# Patient Record
Sex: Female | Born: 1990 | Race: Black or African American | Hispanic: No | Marital: Single | Smoking: Never smoker
Health system: Southern US, Community
[De-identification: ages and names within clinical notes are randomized; demographics above are authoritative.]

## PROBLEM LIST (undated history)

## (undated) ENCOUNTER — Emergency Department (HOSPITAL_COMMUNITY): Admission: EM | Payer: BC Managed Care – PPO | Source: Home / Self Care

## (undated) DIAGNOSIS — R52 Pain, unspecified: Secondary | ICD-10-CM

## (undated) DIAGNOSIS — E669 Obesity, unspecified: Secondary | ICD-10-CM

## (undated) DIAGNOSIS — M549 Dorsalgia, unspecified: Secondary | ICD-10-CM

## (undated) DIAGNOSIS — D649 Anemia, unspecified: Secondary | ICD-10-CM

## (undated) DIAGNOSIS — E079 Disorder of thyroid, unspecified: Secondary | ICD-10-CM

## (undated) DIAGNOSIS — E119 Type 2 diabetes mellitus without complications: Secondary | ICD-10-CM

## (undated) DIAGNOSIS — G4730 Sleep apnea, unspecified: Secondary | ICD-10-CM

## (undated) DIAGNOSIS — I10 Essential (primary) hypertension: Secondary | ICD-10-CM

## (undated) DIAGNOSIS — E039 Hypothyroidism, unspecified: Secondary | ICD-10-CM

## (undated) HISTORY — DX: Dorsalgia, unspecified: M54.9

## (undated) HISTORY — DX: Disorder of thyroid, unspecified: E07.9

## (undated) HISTORY — DX: Type 2 diabetes mellitus without complications: E11.9

## (undated) HISTORY — DX: Essential (primary) hypertension: I10

## (undated) HISTORY — PX: TONSILLECTOMY: SUR1361

## (undated) HISTORY — PX: WISDOM TOOTH EXTRACTION: SHX21

---
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
## 2014-08-08 ENCOUNTER — Other Ambulatory Visit (HOSPITAL_COMMUNITY): Payer: Self-pay | Admitting: Specialist

## 2014-08-08 DIAGNOSIS — M5126 Other intervertebral disc displacement, lumbar region: Secondary | ICD-10-CM

## 2014-08-14 ENCOUNTER — Ambulatory Visit (HOSPITAL_COMMUNITY)
Admission: RE | Admit: 2014-08-14 | Discharge: 2014-08-14 | Disposition: A | Discharge status: Home / Self Care | Payer: PRIVATE HEALTH INSURANCE | Source: Ambulatory Visit | Attending: Specialist | Admitting: Specialist

## 2014-08-14 DIAGNOSIS — M5126 Other intervertebral disc displacement, lumbar region: Secondary | ICD-10-CM

## 2014-08-22 ENCOUNTER — Ambulatory Visit: Payer: Self-pay | Admitting: Orthopedic Surgery

## 2014-08-22 NOTE — Progress Notes (Signed)
Please put orders in Epic surgery 09-03-14 pre op 08-27-14 Thanks 

## 2014-08-25 ENCOUNTER — Ambulatory Visit: Payer: Self-pay | Admitting: Orthopedic Surgery

## 2014-08-25 NOTE — H&P (Signed)
Brianna [name] is an 24 y.o. female.   Chief Complaint: back and R leg pain HPI: The patient is a 24 year old female who presents today for follow up of their back. The patient is being followed for their low back pain. They are now 9 1/2 months out from when symptoms began. Symptoms reported today include: numbness (right foot), leg pain (RLE) and pain with sitting. The patient states that they are doing poorly. Current treatment includes: relative rest, activity modification and NSAIDs. The following medication has been used for pain control: antiinflammatory medication (Aleve). The patient presents today following MRI.  No past medical history on file.  No past surgical history on file.  No family history on file. Social History:  has no tobacco, alcohol, and drug history on file.  Allergies: No Known Allergies   (Not in a hospital admission)  No results found for this or any previous visit (from the past 48 hour(s)). No results found.  Review of Systems  Constitutional: Negative.   HENT: Negative.   Eyes: Negative.   Respiratory: Negative.   Cardiovascular: Negative.   Gastrointestinal: Negative.   Genitourinary: Negative.   Musculoskeletal: Positive for back pain.  Skin: Negative.   Neurological: Positive for sensory change and focal weakness.  Psychiatric/Behavioral: Negative.     There were no vitals taken for this visit. Physical Exam  Constitutional: She is oriented to person, place, and time. She appears distressed.  obese  HENT:  Head: Normocephalic and atraumatic.  Eyes: Conjunctivae and EOM are normal. Pupils are equal, round, and reactive to light.  Neck: Normal range of motion. Neck supple.  Cardiovascular: Normal rate and regular rhythm.   Respiratory: Effort normal and breath sounds normal.  GI: Soft. Bowel sounds are normal.  Musculoskeletal:  On exam, she's standing upright in mild distress. Mood and affect are appropriate. She walks with an antalgic  gait. In seated position, SLR on the right produces marked buttock, thigh and calf pain exacerbated with dorsal augmentation maneuver. Left buttock pain. Trace EHL weakness and plantar flexion weakness on the right compared to the left. She has decreased sensation in the S1 dermatome on the right.  Lumbar spine exam reveals no evidence of soft tissue swelling, ecchymosis or deformity. The abdomen is soft and nontender. Nontender over the trochanters. No cellulitis or lymphadenopathy.  Good range of motion of the lumbar spine without associated pain. Motor is 5/5 including tibialis anterior, plantar flexion, quadriceps and hamstrings. Patient is normoreflexic. There is no Babinski or clonus. The patient has good distal pulses. No DVT. No pain and normal range of motion without instability of the hips, knees and ankles.  Neurological: She is alert and oriented to person, place, and time. She has normal reflexes.  Skin: Skin is warm and dry.  Psychiatric: She has a normal mood and affect.    X-rays demonstrate mild disc space narrowing at 4-5 and 5-1. No instability in flexion or extension. Hips are unremarkable. MRI demonstrates a large disc herniation to the right free fragment compressing the S1 nerve root. She has moderate to severe stenosis secondary to this. She has a central disc herniation to the right effacing the 5 root.  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 L5-S1 radiculopathy, myotomal weakness, dermatomal dysesthesias secondary to large disc herniation with extruded fragment at 5-1, large disc herniation at 4-5. 2. Elevated BMI at age of 19.  We had an extensive discussion concerning current pathology, relevant anatomy and treatment options. Her symptoms have been ongoing.  My main concern is the disc herniation and the weakness associated with this. I do not feel injections would be efficacious. She has tried pain medicine, anti-inflammatories without avail. I gave her  a prescription for Neurontin to be taken as directed. They would like to try that first to see what happens. I gave her a work note. She should not be lifting over 5 lbs., bending or prolonged standing or sitting. I'd like to see her back in a week. She's had no change in bowel or bladder function. If that occurs, we discussed urgent decompression. We discussed with her lumbar decompression in extensive detail. She will call within a week. If she has ongoing symptoms, I'd recommend she would be wise to consider decompression at 5-1 and at 4-5.  Brianna [name]'s mother, Brianna DandyMary, called back this morning and states she had been crying all last night with severe pain into her leg. She was taking the Neurontin to no avail. They want to proceed with the surgery as discussed as previously with the decompression at L5-S1 and at L4-5. We discussed the risks and benefits. We will proceed accordingly. In the interim if there is any change in bowel or bladder function or worsening she is to let me know. She will take an anti-inflammatory , activity modification and titrate up on the Neurontin. I do feel she will require a two level decompression, at least at L5-S1 where there is an extruded fragment. Again, she has been having these symptoms for over a year. She has numbness, weakness and a neurologic deficit. She does have a family history of this. She does have an elevated BMI. Following this, it may be appropriate to have a weight based strategy to decrease her risk of recurrent disk herniation and disk degeneration in the future.  Plan microlumbar decompression L4-5, L5-S1  Brianna GrimeJaclyn Ayansh Feutz PA-C for Dr. Shelle IronBeane 08/25/2014, 9:07 AM

## 2014-08-27 ENCOUNTER — Ambulatory Visit (HOSPITAL_COMMUNITY)
Admission: RE | Admit: 2014-08-27 | Discharge: 2014-08-27 | Disposition: A | Discharge status: Home / Self Care | Payer: PRIVATE HEALTH INSURANCE | Source: Ambulatory Visit | Attending: Orthopedic Surgery | Admitting: Orthopedic Surgery

## 2014-08-27 ENCOUNTER — Encounter (HOSPITAL_COMMUNITY): Payer: Self-pay

## 2014-08-27 ENCOUNTER — Encounter (HOSPITAL_COMMUNITY)
Admission: RE | Admit: 2014-08-27 | Discharge: 2014-08-27 | Disposition: A | Discharge status: Home / Self Care | Payer: PRIVATE HEALTH INSURANCE | Source: Ambulatory Visit | Attending: Specialist | Admitting: Specialist

## 2014-08-27 DIAGNOSIS — Z01818 Encounter for other preprocedural examination: Secondary | ICD-10-CM | POA: Insufficient documentation

## 2014-08-27 DIAGNOSIS — M5126 Other intervertebral disc displacement, lumbar region: Secondary | ICD-10-CM

## 2014-08-27 HISTORY — DX: Pain, unspecified: R52

## 2014-08-27 HISTORY — DX: Obesity, unspecified: E66.9

## 2014-08-27 LAB — BASIC METABOLIC PANEL
Anion gap: 10 (ref 5–15)
BUN: 15 mg/dL (ref 6–23)
CHLORIDE: 98 mmol/L (ref 96–112)
CO2: 26 mmol/L (ref 19–32)
CREATININE: 0.5 mg/dL (ref 0.50–1.10)
Calcium: 9.7 mg/dL (ref 8.4–10.5)
GFR calc Af Amer: >90 mL/min (ref >90)
GFR calc non Af Amer: >90 mL/min (ref >90)
GLUCOSE: 268 mg/dL — AB (ref 70–99)
Potassium: 4.1 mmol/L (ref 3.5–5.1)
Sodium: 134 mmol/L — ABNORMAL LOW (ref 135–145)

## 2014-08-27 LAB — CBC
HCT: 41.2 % (ref 36.0–46.0)
Hemoglobin: 13.7 g/dL (ref 12.0–15.0)
MCH: 28.7 pg (ref 26.0–34.0)
MCHC: 33.3 g/dL (ref 30.0–36.0)
MCV: 86.2 fL (ref 78.0–100.0)
Platelets: 260 10*3/uL (ref 150–400)
RBC: 4.78 MIL/uL (ref 3.87–5.11)
RDW: 12.5 % (ref 11.5–15.5)
WBC: 5.5 10*3/uL (ref 4.0–10.5)

## 2014-08-27 LAB — SURGICAL PCR SCREEN
MRSA, PCR: NEGATIVE
STAPHYLOCOCCUS AUREUS: NEGATIVE

## 2014-08-27 LAB — HCG, SERUM, QUALITATIVE: PREG SERUM: NEGATIVE

## 2014-08-27 NOTE — Progress Notes (Signed)
BMP results done 08/27/2014 faxed via EPIC to Dr Shelle IronBeane.

## 2014-08-27 NOTE — Patient Instructions (Addendum)
21 Brianna [name]  08/27/2014   Your procedure is scheduled on:   09-03-2014 Wednesday  Enter through [facility]  Entrance and follow signs to [facility]. Arrive at     0830   AM.  Call this number if you have problems the morning of surgery: [phone]  Or Presurgical Testing [phone].   For Living Will and/or Health Care Power Attorney Forms: please provide copy for your medical record,may bring AM of surgery(Forms should be already notarized -we do not provide this service).(08-27-14  No information preferred today).     Do not eat food/ or drink: After Midnight.      Take these medicines the morning of surgery with A SIP OF WATER: Gabapentin. Oxycodone.   Do not wear jewelry, make-up or nail polish.  Do not wear deodorant, lotions, powders, or perfumes.   Do not shave legs and under arms- 48 hours(2 days) prior to first CHG shower.(Shaving face and neck okay.)  Do not bring valuables to the hospital.(Hospital is not responsible for lost valuables).  Contacts, dentures or removable bridgework, body piercing, hair pins may not be worn into surgery.  Leave suitcase in the car. After surgery it may be brought to your room.  For patients admitted to the hospital, checkout time is 11:00 AM the day of discharge.(Restricted visitors-Any Persons displaying flu-like symptoms or illness).    Patients discharged the day of surgery will not be allowed to drive home. Must have responsible person with you x 24 hours once discharged.  Name and phone number of your driver: krystale rinkenberger, mother [phone] cell     Please read over the following fact sheets that you were given:  CHG(Chlorhexidine Gluconate 4% Surgical Soap) use, MRSA Information, Incentive spirometry.         [facility] - Preparing for Surgery Before surgery, you can play an important role.  Because skin is not sterile, your skin needs to be as free of germs as possible.  You can reduce the number of germs on  your skin by washing with CHG (chlorahexidine gluconate) soap before surgery.  CHG is an antiseptic cleaner which kills germs and bonds with the skin to continue killing germs even after washing. Please DO NOT use if you have an allergy to CHG or antibacterial soaps.  If your skin becomes reddened/irritated stop using the CHG and inform your nurse when you arrive at Short Stay. Do not shave (including legs and underarms) for at least 48 hours prior to the first CHG shower.  You may shave your face/neck. Please follow these instructions carefully:  1.  Shower with CHG Soap the night before surgery and the  morning of Surgery.  2.  If you choose to wash your hair, wash your hair first as usual with your  normal  shampoo.  3.  After you shampoo, rinse your hair and body thoroughly to remove the  shampoo.                           4.  Use CHG as you would any other liquid soap.  You can apply chg directly  to the skin and wash                       Gently with a scrungie or clean washcloth.  5.  Apply the CHG Soap to your body ONLY FROM THE NECK DOWN.   Do not use  on face/ open                           Wound or open sores. Avoid contact with eyes, ears mouth and genitals (private parts).                       Wash face,  Genitals (private parts) with your normal soap.             6.  Wash thoroughly, paying special attention to the area where your surgery  will be performed.  7.  Thoroughly rinse your body with warm water from the neck down.  8.  DO NOT shower/wash with your normal soap after using and rinsing off  the CHG Soap.                9.  Pat yourself dry with a clean towel.            10.  Wear clean pajamas.            11.  Place clean sheets on your bed the night of your first shower and do not  sleep with pets. Day of Surgery : Do not apply any lotions/deodorants the morning of surgery.  Please wear clean clothes to the hospital/surgery center.  FAILURE TO FOLLOW THESE INSTRUCTIONS MAY  RESULT IN THE CANCELLATION OF YOUR SURGERY PATIENT SIGNATURE_________________________________  NURSE SIGNATURE__________________________________  ________________________________________________________________________   Rogelia Mire  An incentive spirometer is a tool that can help keep your lungs clear and active. This tool measures how well you are filling your lungs with each breath. Taking long deep breaths may help reverse or decrease the chance of developing breathing (pulmonary) problems (especially infection) following:  A long period of time when you are unable to move or be active. BEFORE THE PROCEDURE   If the spirometer includes an indicator to show your best effort, your nurse or respiratory therapist will set it to a desired goal.  If possible, sit up straight or lean slightly forward. Try not to slouch.  Hold the incentive spirometer in an upright position. INSTRUCTIONS FOR USE  1. Sit on the edge of your bed if possible, or sit up as far as you can in bed or on a chair. 2. Hold the incentive spirometer in an upright position. 3. Breathe out normally. 4. Place the mouthpiece in your mouth and seal your lips tightly around it. 5. Breathe in slowly and as deeply as possible, raising the piston or the ball toward the top of the column. 6. Hold your breath for 3-5 seconds or for as long as possible. Allow the piston or ball to fall to the bottom of the column. 7. Remove the mouthpiece from your mouth and breathe out normally. 8. Rest for a few seconds and repeat Steps 1 through 7 at least 10 times every 1-2 hours when you are awake. Take your time and take a few normal breaths between deep breaths. 9. The spirometer may include an indicator to show your best effort. Use the indicator as a goal to work toward during each repetition. 10. After each set of 10 deep breaths, practice coughing to be sure your lungs are clear. If you have an incision (the cut made at the  time of surgery), support your incision when coughing by placing a pillow or rolled up towels firmly against it. Once you are able to get out of bed, walk  around indoors and cough well. You may stop using the incentive spirometer when instructed by your caregiver.  RISKS AND COMPLICATIONS  Take your time so you do not get dizzy or light-headed.  If you are in pain, you may need to take or ask for pain medication before doing incentive spirometry. It is harder to take a deep breath if you are having pain. AFTER USE  Rest and breathe slowly and easily.  It can be helpful to keep track of a log of your progress. Your caregiver can provide you with a simple table to help with this. If you are using the spirometer at home, follow these instructions: SEEK MEDICAL CARE IF:   You are having difficultly using the spirometer.  You have trouble using the spirometer as often as instructed.  Your pain medication is not giving enough relief while using the spirometer.  You develop fever of 100.5 F (38.1 C) or higher. SEEK IMMEDIATE MEDICAL CARE IF:   You cough up bloody sputum that had not been present before.  You develop fever of 102 F (38.9 C) or greater.  You develop worsening pain at or near the incision site. MAKE SURE YOU:   Understand these instructions.  Will watch your condition.  Will get help right away if you are not doing well or get worse. Document Released: 11/21/2006 Document Revised: 10/03/2011 Document Reviewed: 01/22/2007 [facility] Patient Information 2014 [employer] [place].   ________________________________________________________________________

## 2014-08-27 NOTE — Pre-Procedure Instructions (Signed)
08-27-14 Back xray per MD order.

## 2014-09-02 MED ORDER — DEXTROSE 5 % IV SOLN
3.0000 g | INTRAVENOUS | Status: AC
  Administered 2014-09-03: 3 g via INTRAVENOUS
  Filled 2014-09-02: qty 3000

## 2014-09-03 ENCOUNTER — Ambulatory Visit (HOSPITAL_COMMUNITY)
Admission: RE | Admit: 2014-09-03 | Discharge: 2014-09-05 | Disposition: A | Discharge status: Home / Self Care | Payer: PRIVATE HEALTH INSURANCE | Source: Ambulatory Visit | Attending: Specialist | Admitting: Specialist

## 2014-09-03 ENCOUNTER — Encounter (HOSPITAL_COMMUNITY): Payer: Self-pay | Admitting: *Deleted

## 2014-09-03 ENCOUNTER — Encounter (HOSPITAL_COMMUNITY)
Admission: RE | Disposition: A | Discharge status: Home / Self Care | Payer: Self-pay | Source: Ambulatory Visit | Attending: Specialist

## 2014-09-03 ENCOUNTER — Ambulatory Visit (HOSPITAL_COMMUNITY): Payer: PRIVATE HEALTH INSURANCE | Admitting: Anesthesiology

## 2014-09-03 ENCOUNTER — Ambulatory Visit (HOSPITAL_COMMUNITY): Payer: PRIVATE HEALTH INSURANCE

## 2014-09-03 DIAGNOSIS — M5126 Other intervertebral disc displacement, lumbar region: Secondary | ICD-10-CM | POA: Diagnosis present

## 2014-09-03 DIAGNOSIS — R05 Cough: Secondary | ICD-10-CM

## 2014-09-03 DIAGNOSIS — M5127 Other intervertebral disc displacement, lumbosacral region: Secondary | ICD-10-CM | POA: Insufficient documentation

## 2014-09-03 DIAGNOSIS — R739 Hyperglycemia, unspecified: Secondary | ICD-10-CM

## 2014-09-03 DIAGNOSIS — R5081 Fever presenting with conditions classified elsewhere: Secondary | ICD-10-CM | POA: Diagnosis present

## 2014-09-03 DIAGNOSIS — E1169 Type 2 diabetes mellitus with other specified complication: Secondary | ICD-10-CM

## 2014-09-03 DIAGNOSIS — R059 Cough, unspecified: Secondary | ICD-10-CM

## 2014-09-03 DIAGNOSIS — Z6841 Body Mass Index (BMI) 40.0 and over, adult: Secondary | ICD-10-CM | POA: Diagnosis not present

## 2014-09-03 DIAGNOSIS — Z419 Encounter for procedure for purposes other than remedying health state, unspecified: Secondary | ICD-10-CM

## 2014-09-03 DIAGNOSIS — I10 Essential (primary) hypertension: Secondary | ICD-10-CM | POA: Diagnosis present

## 2014-09-03 DIAGNOSIS — E119 Type 2 diabetes mellitus without complications: Secondary | ICD-10-CM

## 2014-09-03 DIAGNOSIS — E669 Obesity, unspecified: Secondary | ICD-10-CM | POA: Diagnosis present

## 2014-09-03 HISTORY — PX: LUMBAR LAMINECTOMY/DECOMPRESSION MICRODISCECTOMY: SHX5026

## 2014-09-03 LAB — GLUCOSE, CAPILLARY
GLUCOSE-CAPILLARY: 293 mg/dL — AB (ref 70–99)
Glucose-Capillary: 254 mg/dL — ABNORMAL HIGH (ref 70–99)
Glucose-Capillary: 263 mg/dL — ABNORMAL HIGH (ref 70–99)
Glucose-Capillary: 238 mg/dL — ABNORMAL HIGH (ref 70–99)

## 2014-09-03 SURGERY — LUMBAR LAMINECTOMY/DECOMPRESSION MICRODISCECTOMY 2 LEVELS
Anesthesia: General | Site: Back

## 2014-09-03 MED ORDER — ROCURONIUM BROMIDE 100 MG/10ML IV SOLN
INTRAVENOUS | Status: DC | PRN
  Administered 2014-09-03: 10 mg via INTRAVENOUS
  Administered 2014-09-03: 50 mg via INTRAVENOUS
  Administered 2014-09-03 (×4): 5 mg via INTRAVENOUS

## 2014-09-03 MED ORDER — DOCUSATE SODIUM 100 MG PO CAPS
100.0000 mg | ORAL_CAPSULE | Freq: Two times a day (BID) | ORAL | Status: DC
  Administered 2014-09-03 – 2014-09-05 (×4): 100 mg via ORAL

## 2014-09-03 MED ORDER — ACETAMINOPHEN 325 MG PO TABS
650.0000 mg | ORAL_TABLET | ORAL | Status: DC | PRN
  Administered 2014-09-04: 650 mg via ORAL
  Filled 2014-09-03: qty 2

## 2014-09-03 MED ORDER — THROMBIN 5000 UNITS EX SOLR
OROMUCOSAL | Status: DC | PRN
  Administered 2014-09-03: 10 mL via TOPICAL

## 2014-09-03 MED ORDER — LABETALOL HCL 5 MG/ML IV SOLN
INTRAVENOUS | Status: DC | PRN
Start: 2014-09-03 — End: 2014-09-03
  Administered 2014-09-03 (×2): 2.5 mg via INTRAVENOUS
  Administered 2014-09-03 (×3): 5 mg via INTRAVENOUS

## 2014-09-03 MED ORDER — METOCLOPRAMIDE HCL 5 MG/ML IJ SOLN
INTRAMUSCULAR | Status: DC | PRN
  Administered 2014-09-03: 10 mg via INTRAVENOUS

## 2014-09-03 MED ORDER — ACETAMINOPHEN 650 MG RE SUPP: 650.0000 mg | RECTAL | Status: DC | PRN

## 2014-09-03 MED ORDER — LACTATED RINGERS IV SOLN
INTRAVENOUS | Status: DC
  Administered 2014-09-03: 15:00:00 via INTRAVENOUS

## 2014-09-03 MED ORDER — GABAPENTIN 300 MG PO CAPS
300.0000 mg | ORAL_CAPSULE | Freq: Three times a day (TID) | ORAL | Status: DC
Start: 2014-09-03 — End: 2014-09-05
  Administered 2014-09-03 – 2014-09-05 (×5): 300 mg via ORAL
  Filled 2014-09-03 (×7): qty 1

## 2014-09-03 MED ORDER — ROCURONIUM BROMIDE 100 MG/10ML IV SOLN
INTRAVENOUS | Status: AC
  Filled 2014-09-03: qty 1

## 2014-09-03 MED ORDER — SODIUM CHLORIDE 0.9 % IJ SOLN: 3.0000 mL | INTRAMUSCULAR | Status: DC | PRN

## 2014-09-03 MED ORDER — LIDOCAINE HCL (CARDIAC) 20 MG/ML IV SOLN
INTRAVENOUS | Status: DC | PRN
  Administered 2014-09-03: 60 mg via INTRAVENOUS

## 2014-09-03 MED ORDER — SODIUM CHLORIDE 0.9 % IJ SOLN
3.0000 mL | Freq: Two times a day (BID) | INTRAMUSCULAR | Status: DC
  Administered 2014-09-03: 3 mL via INTRAVENOUS

## 2014-09-03 MED ORDER — MENTHOL 3 MG MT LOZG
1.0000 | LOZENGE | OROMUCOSAL | Status: DC | PRN
  Filled 2014-09-03: qty 9

## 2014-09-03 MED ORDER — FENTANYL CITRATE 0.05 MG/ML IJ SOLN
INTRAMUSCULAR | Status: AC
  Filled 2014-09-03: qty 5

## 2014-09-03 MED ORDER — RISAQUAD PO CAPS
1.0000 | ORAL_CAPSULE | Freq: Every day | ORAL | Status: DC
  Administered 2014-09-03 – 2014-09-05 (×3): 1 via ORAL
  Filled 2014-09-03 (×3): qty 1

## 2014-09-03 MED ORDER — OXYCODONE-ACETAMINOPHEN 7.5-325 MG PO TABS: 1.0000 | ORAL_TABLET | ORAL | Status: DC | PRN

## 2014-09-03 MED ORDER — METHOCARBAMOL 500 MG PO TABS
500.0000 mg | ORAL_TABLET | Freq: Four times a day (QID) | ORAL | Status: DC | PRN
  Administered 2014-09-04 – 2014-09-05 (×4): 500 mg via ORAL
  Filled 2014-09-03 (×4): qty 1

## 2014-09-03 MED ORDER — ONDANSETRON HCL 4 MG/2ML IJ SOLN
4.0000 mg | INTRAMUSCULAR | Status: DC | PRN
Start: 2014-09-03 — End: 2014-09-05

## 2014-09-03 MED ORDER — DEXTROSE 5 % IV SOLN
3.0000 g | Freq: Once | INTRAVENOUS | Status: AC
  Administered 2014-09-03: 3 g via INTRAVENOUS
  Filled 2014-09-03: qty 3000

## 2014-09-03 MED ORDER — HYDROMORPHONE HCL 1 MG/ML IJ SOLN
INTRAMUSCULAR | Status: DC | PRN
  Administered 2014-09-03: .4 mg via INTRAVENOUS
  Administered 2014-09-03 (×3): .2 mg via INTRAVENOUS
  Administered 2014-09-03: .4 mg via INTRAVENOUS

## 2014-09-03 MED ORDER — GLYCOPYRROLATE 0.2 MG/ML IJ SOLN
INTRAMUSCULAR | Status: AC
  Filled 2014-09-03: qty 2

## 2014-09-03 MED ORDER — LACTATED RINGERS IV SOLN
INTRAVENOUS | Status: DC
  Administered 2014-09-03: 13:00:00 via INTRAVENOUS
  Administered 2014-09-03: 1000 mL via INTRAVENOUS

## 2014-09-03 MED ORDER — ONDANSETRON HCL 4 MG/2ML IJ SOLN
INTRAMUSCULAR | Status: AC
  Filled 2014-09-03: qty 2

## 2014-09-03 MED ORDER — PROPOFOL 10 MG/ML IV BOLUS
INTRAVENOUS | Status: AC
  Filled 2014-09-03: qty 20

## 2014-09-03 MED ORDER — FENTANYL CITRATE 0.05 MG/ML IJ SOLN: 25.0000 ug | INTRAMUSCULAR | Status: DC | PRN

## 2014-09-03 MED ORDER — NEOSTIGMINE METHYLSULFATE 10 MG/10ML IV SOLN
INTRAVENOUS | Status: AC
  Filled 2014-09-03: qty 1

## 2014-09-03 MED ORDER — BUPIVACAINE-EPINEPHRINE (PF) 0.5% -1:200000 IJ SOLN
INTRAMUSCULAR | Status: DC | PRN
  Administered 2014-09-03: 17 mL

## 2014-09-03 MED ORDER — HYDROMORPHONE HCL 1 MG/ML IJ SOLN
0.5000 mg | INTRAMUSCULAR | Status: DC | PRN
  Administered 2014-09-03 – 2014-09-04 (×9): 1 mg via INTRAVENOUS
  Filled 2014-09-03 (×11): qty 1

## 2014-09-03 MED ORDER — LIDOCAINE HCL (CARDIAC) 20 MG/ML IV SOLN
INTRAVENOUS | Status: AC
  Filled 2014-09-03: qty 5

## 2014-09-03 MED ORDER — METOPROLOL TARTRATE 25 MG PO TABS
25.0000 mg | ORAL_TABLET | Freq: Once | ORAL | Status: AC
  Administered 2014-09-03: 25 mg via ORAL
  Filled 2014-09-03: qty 1

## 2014-09-03 MED ORDER — GLYCOPYRROLATE 0.2 MG/ML IJ SOLN
INTRAMUSCULAR | Status: DC | PRN
  Administered 2014-09-03: 0.1 mg via INTRAVENOUS
  Administered 2014-09-03: .8 mg via INTRAVENOUS

## 2014-09-03 MED ORDER — SODIUM CHLORIDE 0.9 % IR SOLN
Status: DC | PRN
  Administered 2014-09-03: 500 mL

## 2014-09-03 MED ORDER — INSULIN ASPART 100 UNIT/ML ~~LOC~~ SOLN: 0.0000 [IU] | Freq: Three times a day (TID) | SUBCUTANEOUS | Status: DC

## 2014-09-03 MED ORDER — SODIUM CHLORIDE 0.9 % IJ SOLN
INTRAMUSCULAR | Status: AC
  Filled 2014-09-03: qty 10

## 2014-09-03 MED ORDER — INSULIN ASPART 100 UNIT/ML ~~LOC~~ SOLN
0.0000 [IU] | Freq: Every day | SUBCUTANEOUS | Status: DC
  Administered 2014-09-03 – 2014-09-04 (×2): 2 [IU] via SUBCUTANEOUS

## 2014-09-03 MED ORDER — INFLUENZA VAC SPLIT QUAD 0.5 ML IM SUSY
0.5000 mL | PREFILLED_SYRINGE | INTRAMUSCULAR | Status: AC
  Administered 2014-09-04: 0.5 mL via INTRAMUSCULAR
  Filled 2014-09-03 (×2): qty 0.5

## 2014-09-03 MED ORDER — METHOCARBAMOL 1000 MG/10ML IJ SOLN
500.0000 mg | Freq: Four times a day (QID) | INTRAVENOUS | Status: DC | PRN
  Administered 2014-09-03: 500 mg via INTRAVENOUS
  Filled 2014-09-03 (×4): qty 5

## 2014-09-03 MED ORDER — SORBITOL 70 % SOLN
30.0000 mL | Freq: Every day | Status: DC | PRN
  Filled 2014-09-03: qty 30

## 2014-09-03 MED ORDER — DOCUSATE SODIUM 100 MG PO CAPS: 100.0000 mg | ORAL_CAPSULE | Freq: Two times a day (BID) | ORAL | Status: DC | PRN

## 2014-09-03 MED ORDER — MEPERIDINE HCL 50 MG/ML IJ SOLN: 6.2500 mg | INTRAMUSCULAR | Status: DC | PRN

## 2014-09-03 MED ORDER — HYDROCODONE-ACETAMINOPHEN 5-325 MG PO TABS
1.0000 | ORAL_TABLET | ORAL | Status: DC | PRN
  Administered 2014-09-04 – 2014-09-05 (×4): 2 via ORAL
  Filled 2014-09-03 (×4): qty 2

## 2014-09-03 MED ORDER — THROMBIN 5000 UNITS EX SOLR
CUTANEOUS | Status: AC
  Filled 2014-09-03: qty 10000

## 2014-09-03 MED ORDER — SODIUM CHLORIDE 0.9 % IR SOLN
Status: AC
  Filled 2014-09-03: qty 1

## 2014-09-03 MED ORDER — OXYCODONE-ACETAMINOPHEN 5-325 MG PO TABS
1.0000 | ORAL_TABLET | ORAL | Status: DC | PRN
  Administered 2014-09-03 – 2014-09-04 (×4): 2 via ORAL
  Administered 2014-09-05: 1 via ORAL
  Filled 2014-09-03 (×3): qty 2
  Filled 2014-09-03: qty 1
  Filled 2014-09-03 (×2): qty 2

## 2014-09-03 MED ORDER — PROPOFOL 10 MG/ML IV BOLUS
INTRAVENOUS | Status: DC | PRN
  Administered 2014-09-03: 200 mg via INTRAVENOUS
  Administered 2014-09-03: 30 mg via INTRAVENOUS

## 2014-09-03 MED ORDER — ONDANSETRON HCL 4 MG/2ML IJ SOLN
INTRAMUSCULAR | Status: DC | PRN
  Administered 2014-09-03: 4 mg via INTRAVENOUS

## 2014-09-03 MED ORDER — NEOSTIGMINE METHYLSULFATE 10 MG/10ML IV SOLN
INTRAVENOUS | Status: DC | PRN
Start: 2014-09-03 — End: 2014-09-03
  Administered 2014-09-03: 5 mg via INTRAVENOUS

## 2014-09-03 MED ORDER — BUPIVACAINE-EPINEPHRINE (PF) 0.5% -1:200000 IJ SOLN
INTRAMUSCULAR | Status: AC
Start: 2014-09-03 — End: 2014-09-03
  Filled 2014-09-03: qty 30

## 2014-09-03 MED ORDER — PROMETHAZINE HCL 25 MG/ML IJ SOLN: 6.2500 mg | INTRAMUSCULAR | Status: DC | PRN

## 2014-09-03 MED ORDER — SENNOSIDES-DOCUSATE SODIUM 8.6-50 MG PO TABS: 1.0000 | ORAL_TABLET | Freq: Every evening | ORAL | Status: DC | PRN

## 2014-09-03 MED ORDER — MIDAZOLAM HCL 5 MG/5ML IJ SOLN
INTRAMUSCULAR | Status: DC | PRN
  Administered 2014-09-03: 2 mg via INTRAVENOUS

## 2014-09-03 MED ORDER — DEXTROSE 5 % IV SOLN
3.0000 g | Freq: Three times a day (TID) | INTRAVENOUS | Status: AC
  Administered 2014-09-03 – 2014-09-04 (×3): 3 g via INTRAVENOUS
  Filled 2014-09-03 (×3): qty 3000

## 2014-09-03 MED ORDER — FENTANYL CITRATE 0.05 MG/ML IJ SOLN
INTRAMUSCULAR | Status: DC | PRN
  Administered 2014-09-03 (×2): 50 ug via INTRAVENOUS
  Administered 2014-09-03: 100 ug via INTRAVENOUS
  Administered 2014-09-03: 50 ug via INTRAVENOUS
  Administered 2014-09-03: 100 ug via INTRAVENOUS
  Administered 2014-09-03: 50 ug via INTRAVENOUS
  Administered 2014-09-03: 100 ug via INTRAVENOUS

## 2014-09-03 MED ORDER — INSULIN ASPART 100 UNIT/ML ~~LOC~~ SOLN
0.0000 [IU] | Freq: Three times a day (TID) | SUBCUTANEOUS | Status: DC
  Administered 2014-09-03: 5 [IU] via SUBCUTANEOUS
  Administered 2014-09-04: 7 [IU] via SUBCUTANEOUS
  Administered 2014-09-04 – 2014-09-05 (×3): 3 [IU] via SUBCUTANEOUS

## 2014-09-03 MED ORDER — MIDAZOLAM HCL 2 MG/2ML IJ SOLN
INTRAMUSCULAR | Status: AC
  Filled 2014-09-03: qty 2

## 2014-09-03 MED ORDER — HYDROMORPHONE HCL 1 MG/ML IJ SOLN
INTRAMUSCULAR | Status: AC
  Administered 2014-09-03: 1 mg
  Filled 2014-09-03: qty 1

## 2014-09-03 MED ORDER — LABETALOL HCL 5 MG/ML IV SOLN
INTRAVENOUS | Status: AC
Start: 2014-09-03 — End: 2014-09-03
  Filled 2014-09-03: qty 4

## 2014-09-03 MED ORDER — PANTOPRAZOLE SODIUM 40 MG IV SOLR
40.0000 mg | Freq: Every day | INTRAVENOUS | Status: DC
  Administered 2014-09-03: 40 mg via INTRAVENOUS
  Filled 2014-09-03 (×2): qty 40

## 2014-09-03 MED ORDER — HYDROMORPHONE HCL 2 MG/ML IJ SOLN
INTRAMUSCULAR | Status: AC
  Filled 2014-09-03: qty 1

## 2014-09-03 MED ORDER — POTASSIUM CHLORIDE IN NACL 20-0.45 MEQ/L-% IV SOLN
INTRAVENOUS | Status: AC
  Administered 2014-09-03 – 2014-09-04 (×2): via INTRAVENOUS
  Filled 2014-09-03 (×2): qty 1000

## 2014-09-03 MED ORDER — METHOCARBAMOL 500 MG PO TABS: 500.0000 mg | ORAL_TABLET | Freq: Three times a day (TID) | ORAL | Status: DC

## 2014-09-03 MED ORDER — ALUM & MAG HYDROXIDE-SIMETH 200-200-20 MG/5ML PO SUSP: 30.0000 mL | Freq: Four times a day (QID) | ORAL | Status: DC | PRN

## 2014-09-03 MED ORDER — SODIUM CHLORIDE 0.9 % IV SOLN: 250.0000 mL | INTRAVENOUS | Status: DC

## 2014-09-03 MED ORDER — PHENOL 1.4 % MT LIQD
1.0000 | OROMUCOSAL | Status: DC | PRN
  Filled 2014-09-03: qty 177

## 2014-09-03 MED ORDER — GLYCOPYRROLATE 0.2 MG/ML IJ SOLN
INTRAMUSCULAR | Status: AC
  Filled 2014-09-03: qty 4

## 2014-09-03 SURGICAL SUPPLY — 52 items
BAG ZIPLOCK 12X15 (MISCELLANEOUS) IMPLANT
CLEANER TIP ELECTROSURG 2X2 (MISCELLANEOUS) ×3 IMPLANT
CLOSURE WOUND 1/2 X4 (GAUZE/BANDAGES/DRESSINGS)
CLOTH 2% CHLOROHEXIDINE 3PK (PERSONAL CARE ITEMS) ×3 IMPLANT
DRAPE MICROSCOPE LEICA (MISCELLANEOUS) ×3 IMPLANT
DRAPE POUCH INSTRU U-SHP 10X18 (DRAPES) ×3 IMPLANT
DRAPE SURG 17X11 SM STRL (DRAPES) ×3 IMPLANT
DRAPE UTILITY XL STRL (DRAPES) ×3 IMPLANT
DRSG AQUACEL AG ADV 3.5X 6 (GAUZE/BANDAGES/DRESSINGS) ×3 IMPLANT
DURAFORM SPONGE 2X2 SINGLE (Neuro Prosthesis/Implant) ×3 IMPLANT
DURAPREP 26ML APPLICATOR (WOUND CARE) ×3 IMPLANT
DURASEAL SPINE SEALANT 3ML (MISCELLANEOUS) IMPLANT
ELECT BLADE TIP CTD 4 INCH (ELECTRODE) ×3 IMPLANT
ELECT REM PT RETURN 9FT ADLT (ELECTROSURGICAL) ×3
ELECTRODE REM PT RTRN 9FT ADLT (ELECTROSURGICAL) ×1 IMPLANT
GLOVE BIOGEL PI IND STRL 6.5 (GLOVE) ×1 IMPLANT
GLOVE BIOGEL PI IND STRL 7.0 (GLOVE) ×1 IMPLANT
GLOVE BIOGEL PI IND STRL 7.5 (GLOVE) ×1 IMPLANT
GLOVE BIOGEL PI INDICATOR 6.5 (GLOVE) ×2
GLOVE BIOGEL PI INDICATOR 7.0 (GLOVE) ×2
GLOVE BIOGEL PI INDICATOR 7.5 (GLOVE) ×2
GLOVE SURG SS PI 7.5 STRL IVOR (GLOVE) ×3 IMPLANT
GLOVE SURG SS PI 8.0 STRL IVOR (GLOVE) ×6 IMPLANT
GLOVE SURG SS PI 8.5 STRL IVOR (GLOVE) ×4
GLOVE SURG SS PI 8.5 STRL STRW (GLOVE) ×2 IMPLANT
GOWN STRL REUS W/ TWL LRG LVL3 (GOWN DISPOSABLE) ×1 IMPLANT
GOWN STRL REUS W/TWL LRG LVL3 (GOWN DISPOSABLE) ×2
GOWN STRL REUS W/TWL XL LVL3 (GOWN DISPOSABLE) ×9 IMPLANT
IV CATH 14GX2 1/4 (CATHETERS) ×3 IMPLANT
KIT BASIN OR (CUSTOM PROCEDURE TRAY) ×3 IMPLANT
KIT POSITIONING SURG ANDREWS (MISCELLANEOUS) ×3 IMPLANT
MANIFOLD NEPTUNE II (INSTRUMENTS) ×3 IMPLANT
NEEDLE SPNL 18GX3.5 QUINCKE PK (NEEDLE) ×9 IMPLANT
PACK LAMINECTOMY ORTHO (CUSTOM PROCEDURE TRAY) ×3 IMPLANT
PATTIES SURGICAL .5 X.5 (GAUZE/BANDAGES/DRESSINGS) ×3 IMPLANT
PATTIES SURGICAL .75X.75 (GAUZE/BANDAGES/DRESSINGS) ×3 IMPLANT
SPONGE SURGIFOAM ABS GEL 100 (HEMOSTASIS) ×3 IMPLANT
STRIP CLOSURE SKIN 1/2X4 (GAUZE/BANDAGES/DRESSINGS) IMPLANT
SUT NURALON 4 0 TR CR/8 (SUTURE) IMPLANT
SUT PROLENE 3 0 PS 2 (SUTURE) IMPLANT
SUT VIC AB 1 CT1 27 (SUTURE) ×4
SUT VIC AB 1 CT1 27XBRD ANTBC (SUTURE) ×2 IMPLANT
SUT VIC AB 1-0 CT2 27 (SUTURE) IMPLANT
SUT VIC AB 2-0 CT1 27 (SUTURE) ×4
SUT VIC AB 2-0 CT1 TAPERPNT 27 (SUTURE) ×2 IMPLANT
SUT VIC AB 2-0 CT2 27 (SUTURE) ×3 IMPLANT
SUT VLOC 180 0 24IN GS25 (SUTURE) ×3 IMPLANT
SYR 3ML LL SCALE MARK (SYRINGE) ×3 IMPLANT
TOWEL OR 17X26 10 PK STRL BLUE (TOWEL DISPOSABLE) ×3 IMPLANT
TOWEL OR NON WOVEN STRL DISP B (DISPOSABLE) ×3 IMPLANT
TRAY FOLEY CATH 14FRSI W/METER (CATHETERS) ×3 IMPLANT
YANKAUER SUCT BULB TIP NO VENT (SUCTIONS) ×3 IMPLANT

## 2014-09-03 NOTE — Discharge Instructions (Signed)
Walk As Tolerated utilizing back precautions.  No bending, twisting, or lifting.  No driving for 2 weeks.   °Aquacel dressing may remain in place until follow up. May shower with aquacel dressing in place. If the dressing peels off or becomes saturated, you may remove aquacel dressing and place gauze and tape dressing which should be kept clean and dry and changed daily. Do not remove steri-strips if they are present. °See Dr. Beane in office in 2 weeks. Begin taking aspirin 81mg per day starting 4 days after your surgery if not allergic to aspirin or on another blood thinner. °Walk daily even outside. Use a cane or walker only if necessary. °Avoid sitting on soft sofas. ° °

## 2014-09-03 NOTE — Interval H&P Note (Signed)
History and Physical Interval Note:  09/03/2014 8:24 AM  Brianna [name]  has presented today for surgery, with the diagnosis of H & P L4-S1  The various methods of treatment have been discussed with the patient and family. After consideration of risks, benefits and other options for treatment, the patient has consented to  Procedure(s): LUMBAR LAMINECTOMY/DECOMPRESSION MICRODISCECTOMY 2 LEVELS (N/A) as a surgical intervention .  The patient's history has been reviewed, patient examined, no change in status, stable for surgery.  I have reviewed the patient's chart and labs.  Questions were answered to the patient's satisfaction.     [name]

## 2014-09-03 NOTE — Anesthesia Preprocedure Evaluation (Signed)
Anesthesia Evaluation  Patient identified by MRN, date of birth, ID band Patient awake    Reviewed: Allergy & Precautions, NPO status , Patient's Chart, lab work & pertinent test results  Airway Mallampati: III  TM Distance: >3 FB Neck ROM: Full    Dental no notable dental hx.    Pulmonary neg pulmonary ROS,  breath sounds clear to auscultation  Pulmonary exam normal       Cardiovascular negative cardio ROS  Rhythm:Regular Rate:Normal     Neuro/Psych negative neurological ROS  negative psych ROS   GI/Hepatic negative GI ROS, Neg liver ROS,   Endo/Other  diabetes, Poorly Controlled, Type 2, Oral Hypoglycemic AgentsMorbid obesity  Renal/GU negative Renal ROS  negative genitourinary   Musculoskeletal negative musculoskeletal ROS (+)   Abdominal   Peds negative pediatric ROS (+)  Hematology negative hematology ROS (+)   Anesthesia Other Findings   Reproductive/Obstetrics negative OB ROS                             Anesthesia Physical Anesthesia Plan  ASA: III  Anesthesia Plan: General   Post-op Pain Management:    Induction: Intravenous  Airway Management Planned: Oral ETT  Additional Equipment:   Intra-op Plan:   Post-operative Plan: Extubation in OR  Informed Consent: I have reviewed the patients History and Physical, chart, labs and discussed the procedure including the risks, benefits and alternatives for the proposed anesthesia with the patient or authorized representative who has indicated his/her understanding and acceptance.   Dental advisory given  Plan Discussed with: CRNA  Anesthesia Plan Comments:         Anesthesia Quick Evaluation

## 2014-09-03 NOTE — Consult Note (Signed)
[facility] Medical Consultation  Elane Fritzranda Macdowell ONG:[id number]:[id number] DOB: 03/14/1991 DOA: 09/03/2014 PCP: No PCP Per Patient   Requesting physician: Dr. Shelle IronBeane Date of consultation: 09/03/2014 Reason for consultation: hyperglycemia, HTN  HPI:  24 year old female without many known medical problems, was admitted by orthopedic service for L4-S1 decompression surgery. Hospitalist service was asked to see due to hyperglycemia and elevated blood pressures.  Patient denies any known history of diabetes, her mother does have diabetes and is on oral agents, she denies any known history of hypertension. She is not seeing a PCP on a regular basis, she is to have a primary doctor however he left town and she has not reestablished with anybody else. She denies any chest pain or breathing difficulties, she denies any abdominal pain nausea vomiting or diarrhea. She endorses intermittent increased thirst and increased urination, without any specific pattern.  Review of Systems:  As per history of present illness, otherwise 10 point review of systems negative  Impression/Recommendations Principal Problem:   HNP (herniated nucleus pulposus), lumbar Active Problems:   Obesity   Hyperglycemia   HTN (hypertension)   1. Hyperglycemia - she likely has diabetes mellitus, we'll obtain a hemoglobin A1c to determine control. Continue sliding scale insulin for now, if she tolerates full liquid diet can probably advanced to carb modified diet. A1c level will determine whether she will need insulin at home or we can use oral agents. 2. Elevated blood pressure - is not unreasonable to think that she has hypertension, would like to monitor overnight and see the numbers in the morning, pain can definitely increase her blood pressure and it may be related to that. Can probably start an ACE inhibitor given the possibility of diabetes. 3. Obesity - I have discussed extensively with the patient and the patient's mother  about her weight, she likely has insulin resistance as well as hypertension, strongly advised for diet modification and weight loss. Her BMI is 52.    I will followup again tomorrow. Please contact me if I can be of assistance in the meanwhile. Thank you for this consultation.   Past Medical History  Diagnosis Date  . Obesity   . Pain     back pain, dx. herniated nucleus polpusos   Past Surgical History  Procedure Laterality Date  . Wisdom tooth extraction     Social History:  reports that she has never smoked. She has never used smokeless tobacco. She reports that she does not drink alcohol or use illicit drugs.  No Known Allergies History reviewed. No pertinent family history.  Prior to Admission medications   Medication Sig Start Date End Date Taking? Authorizing Provider  gabapentin (NEURONTIN) 300 MG capsule Take 300 mg by mouth 3 (three) times daily.   Yes Historical Provider, MD  naproxen sodium (ANAPROX) 220 MG tablet Take 220 mg by mouth 2 (two) times daily as needed.   Yes Historical Provider, MD  oxyCODONE-acetaminophen (PERCOCET/ROXICET) 5-325 MG per tablet Take 1 tablet by mouth every 4 (four) hours as needed for moderate pain or severe pain.   Yes Historical Provider, MD  docusate sodium (COLACE) 100 MG capsule Take 1 capsule (100 mg total) by mouth 2 (two) times daily as needed for mild constipation. 09/03/14   Javier DockerJeffrey C Beane, MD  methocarbamol (ROBAXIN) 500 MG tablet Take 1 tablet (500 mg total) by mouth 3 (three) times daily. 09/03/14   Javier DockerJeffrey C Beane, MD  oxyCODONE-acetaminophen (PERCOCET) 7.5-325 MG per tablet Take 1 tablet by mouth every 4 (four)  hours as needed for pain. 09/03/14   Javier Docker, MD   Physical Exam: Blood pressure 153/82, pulse 102, temperature 98.6 F (37 C), temperature source Oral, resp. rate 16, height  (1.626 m), weight 138.347 kg (305 lb), SpO2 100 %. Filed Vitals:   09/03/14 1550  BP: 153/82  Pulse: 102  Temp: 98.6 F (37 C)    Resp: 16     General:  NAD, somewhat drowsy post op  Eyes: no scleral icterus  Neck: no JVD  Cardiovascular: RRR  Respiratory: CTA biL  Abdomen: soft, obese, non tender  Skin: no rashes  Psychiatric: normal mood  Neurologic: non focal  Labs on Admission:  Basic Metabolic Panel: No results for input(s): NA, K, CL, CO2, GLUCOSE, BUN, CREATININE, CALCIUM, MG, PHOS in the last 168 hours. Liver Function Tests: No results for input(s): AST, ALT, ALKPHOS, BILITOT, PROT, ALBUMIN in the last 168 hours. No results for input(s): LIPASE, AMYLASE in the last 168 hours. No results for input(s): AMMONIA in the last 168 hours. CBC: No results for input(s): WBC, NEUTROABS, HGB, HCT, MCV, PLT in the last 168 hours. Cardiac Enzymes: No results for input(s): CKTOTAL, CKMB, CKMBINDEX, TROPONINI in the last 168 hours. BNP: Invalid input(s): POCBNP CBG:  Recent Labs Lab 09/03/14 0954 09/03/14 1450  GLUCAP 254* 263*    Radiological Exams on Admission: Dg Spine Portable 1 View  09/03/2014   CLINICAL DATA:  Micro lumbar decompression L4-5 and L5-S1.  EXAM: PORTABLE SPINE - 1 VIEW  COMPARISON:  09/03/2014 at 1133 hours.  FINDINGS: A single intraoperative cross-table lateral view of the lumbar spine, taken at 1349 hours, is submitted. Numbering system utilized on the comparison examination is preserved. Surgical instrument tip projects along the posterior aspect of the L4-5 interspace.  IMPRESSION: L4-5 intraoperative localization.   Electronically Signed   By: Leanna Battles M.D.   On: 09/03/2014 14:12   Dg Spine Portable 1 View  09/03/2014   CLINICAL DATA:  Surgical level L4-5, L5-S1.  EXAM: PORTABLE SPINE - 1 VIEW  COMPARISON:  09/03/2014.  To 09/2014.  FINDINGS: Posterior surgical instruments are in place with the localized instrument at the L5-S1 level.  IMPRESSION: Intraoperative localization as above.   Electronically Signed   By: Charlett Nose M.D.   On: 09/03/2014 12:48   Dg Spine  Portable 1 View  09/03/2014   CLINICAL DATA:  24 year old female undergoing L4-L5 and L5-S1 posterior lumbar interbody fusion  EXAM: PORTABLE SPINE - 1 VIEW  COMPARISON:  Prior radiographs obtained earlier today at 10:56 a.m.  FINDINGS: Single cross-table lateral radiograph demonstrates soft tissue spreaders posterior to the L5-S1 level. Metallic probes are present at the L4-L5 apophyseal joint, and at the L5-S1 apophyseal joint.  IMPRESSION: Intraoperative localization radiographs as above.   Electronically Signed   By: Malachy Moan M.D.   On: 09/03/2014 12:08   Dg Spine Portable 1 View  09/03/2014   CLINICAL DATA:  Surgical level L4-5 and L5-S1.  EXAM: PORTABLE SPINE - 1 VIEW  COMPARISON:  08/27/2014  FINDINGS: Posterior needles are directed toward the S1 vertebral body and the L4-5 interspace.  IMPRESSION: Intraoperative localization as above.   Electronically Signed   By: Charlett Nose M.D.   On: 09/03/2014 11:25   Time spent: 55 minutes  Pamella Pert [facility] Pager [phone]  If 7PM-7AM, please contact night-coverage [email or web address] Password [facility] 09/03/2014, 4:44 PM

## 2014-09-03 NOTE — Brief Op Note (Signed)
09/03/2014  2:23 PM  PATIENT:  Brianna [name]  24 y.o. female  PRE-OPERATIVE DIAGNOSIS:  HNP L4-L5, L5-S1  POST-OPERATIVE DIAGNOSIS:  HNP L4-L5, L5-S1  PROCEDURE:  Procedure(s): MICROLUMBAR DECOMPRESSION LUMBAR FOUR TO FIVE, LUMBAR FIVE TO SACRAL ONE (N/A)  SURGEON:  Surgeon(s) and Role:    * Javier DockerJeffrey C Axie Hayne, MD - Primary  PHYSICIAN ASSISTANT:   ASSISTANTS: Bissell   ANESTHESIA:   general  EBL:  Total I/O In: 1000 [I.V.:1000] Out: 390 [Urine:250; Blood:140]  BLOOD ADMINISTERED:none  DRAINS: none   LOCAL MEDICATIONS USED:  MARCAINE     SPECIMEN:  Source of Specimen:  L5S1, L45  DISPOSITION OF SPECIMEN:  PATHOLOGY  COUNTS:  YES  TOURNIQUET:  * No tourniquets in log *  DICTATION: .Other Dictation: Dictation Number [id number]  PLAN OF CARE: Admit for overnight observation  PATIENT DISPOSITION:  PACU - hemodynamically stable.   Delay start of Pharmacological VTE agent (>24hrs) due to surgical blood loss or risk of bleeding: yes

## 2014-09-03 NOTE — Anesthesia Postprocedure Evaluation (Signed)
Anesthesia Post Note  Patient: Scientist, research (physical sciences)Brianna [name]  Procedure(s) Performed: Procedure(s) (LRB): MICROLUMBAR DECOMPRESSION LUMBAR FOUR TO FIVE, LUMBAR FIVE TO SACRAL ONE (N/A)  Anesthesia type: general  Patient location: PACU  Post pain: Pain level controlled  Post assessment: Patient's Cardiovascular Status Stable  Last Vitals:  Filed Vitals:   09/03/14 1654  BP: 143/80  Pulse: 107  Temp: 36.9 C  Resp: 16    Post vital signs: Reviewed and stable  Level of consciousness: sedated  Complications: No apparent anesthesia complications

## 2014-09-03 NOTE — Transfer of Care (Signed)
Immediate Anesthesia Transfer of Care Note  Patient: Brianna [name]  Procedure(s) Performed: Procedure(s): MICROLUMBAR DECOMPRESSION LUMBAR FOUR TO FIVE, LUMBAR FIVE TO SACRAL ONE (N/A)  Patient Location: PACU  Anesthesia Type:General  Level of Consciousness: sedated  Airway & Oxygen Therapy: Patient Spontanous Breathing and Patient connected to face mask oxygen  Post-op Assessment: Report given to RN and Post -op Vital signs reviewed and stable  Post vital signs: Reviewed and stable  Last Vitals:  Filed Vitals:   09/03/14 0822  BP: 150/89  Pulse: 114  Temp: 36.4 C  Resp: 16    Complications: No apparent anesthesia complications

## 2014-09-03 NOTE — H&P (View-Only) (Signed)
Brianna [name] is an 24 y.o. female.   Chief Complaint: back and R leg pain HPI: The patient is a 24 year old female who presents today for follow up of their back. The patient is being followed for their low back pain. They are now 9 1/2 months out from when symptoms began. Symptoms reported today include: numbness (right foot), leg pain (RLE) and pain with sitting. The patient states that they are doing poorly. Current treatment includes: relative rest, activity modification and NSAIDs. The following medication has been used for pain control: antiinflammatory medication (Aleve). The patient presents today following MRI.  No past medical history on file.  No past surgical history on file.  No family history on file. Social History:  has no tobacco, alcohol, and drug history on file.  Allergies: No Known Allergies   (Not in a hospital admission)  No results found for this or any previous visit (from the past 48 hour(s)). No results found.  Review of Systems  Constitutional: Negative.   HENT: Negative.   Eyes: Negative.   Respiratory: Negative.   Cardiovascular: Negative.   Gastrointestinal: Negative.   Genitourinary: Negative.   Musculoskeletal: Positive for back pain.  Skin: Negative.   Neurological: Positive for sensory change and focal weakness.  Psychiatric/Behavioral: Negative.     There were no vitals taken for this visit. Physical Exam  Constitutional: She is oriented to person, place, and time. She appears distressed.  obese  HENT:  Head: Normocephalic and atraumatic.  Eyes: Conjunctivae and EOM are normal. Pupils are equal, round, and reactive to light.  Neck: Normal range of motion. Neck supple.  Cardiovascular: Normal rate and regular rhythm.   Respiratory: Effort normal and breath sounds normal.  GI: Soft. Bowel sounds are normal.  Musculoskeletal:  On exam, she's standing upright in mild distress. Mood and affect are appropriate. She walks with an antalgic  gait. In seated position, SLR on the right produces marked buttock, thigh and calf pain exacerbated with dorsal augmentation maneuver. Left buttock pain. Trace EHL weakness and plantar flexion weakness on the right compared to the left. She has decreased sensation in the S1 dermatome on the right.  Lumbar spine exam reveals no evidence of soft tissue swelling, ecchymosis or deformity. The abdomen is soft and nontender. Nontender over the trochanters. No cellulitis or lymphadenopathy.  Good range of motion of the lumbar spine without associated pain. Motor is 5/5 including tibialis anterior, plantar flexion, quadriceps and hamstrings. Patient is normoreflexic. There is no Babinski or clonus. The patient has good distal pulses. No DVT. No pain and normal range of motion without instability of the hips, knees and ankles.  Neurological: She is alert and oriented to person, place, and time. She has normal reflexes.  Skin: Skin is warm and dry.  Psychiatric: She has a normal mood and affect.    X-rays demonstrate mild disc space narrowing at 4-5 and 5-1. No instability in flexion or extension. Hips are unremarkable. MRI demonstrates a large disc herniation to the right free fragment compressing the S1 nerve root. She has moderate to severe stenosis secondary to this. She has a central disc herniation to the right effacing the 5 root.  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 L5-S1 radiculopathy, myotomal weakness, dermatomal dysesthesias secondary to large disc herniation with extruded fragment at 5-1, large disc herniation at 4-5. 2. Elevated BMI at age of 24.  We had an extensive discussion concerning current pathology, relevant anatomy and treatment options. Her symptoms have been ongoing.   My main concern is the disc herniation and the weakness associated with this. I do not feel injections would be efficacious. She has tried pain medicine, anti-inflammatories without avail. I gave her  a prescription for Neurontin to be taken as directed. They would like to try that first to see what happens. I gave her a work note. She should not be lifting over 5 lbs., bending or prolonged standing or sitting. I'd like to see her back in a week. She's had no change in bowel or bladder function. If that occurs, we discussed urgent decompression. We discussed with her lumbar decompression in extensive detail. She will call within a week. If she has ongoing symptoms, I'd recommend she would be wise to consider decompression at 5-1 and at 4-5.  Brianna [name]'s mother, Brianna [name], called back this morning and states she had been crying all last night with severe pain into her leg. She was taking the Neurontin to no avail. They want to proceed with the surgery as discussed as previously with the decompression at L5-S1 and at L4-5. We discussed the risks and benefits. We will proceed accordingly. In the interim if there is any change in bowel or bladder function or worsening she is to let me know. She will take an anti-inflammatory , activity modification and titrate up on the Neurontin. I do feel she will require a two level decompression, at least at L5-S1 where there is an extruded fragment. Again, she has been having these symptoms for over a year. She has numbness, weakness and a neurologic deficit. She does have a family history of this. She does have an elevated BMI. Following this, it may be appropriate to have a weight based strategy to decrease her risk of recurrent disk herniation and disk degeneration in the future.  Plan microlumbar decompression L4-5, L5-S1  [name] PA-C for Dr. Beane 08/25/2014, 9:07 AM    

## 2014-09-04 ENCOUNTER — Ambulatory Visit (HOSPITAL_COMMUNITY): Payer: PRIVATE HEALTH INSURANCE

## 2014-09-04 ENCOUNTER — Encounter (HOSPITAL_COMMUNITY): Payer: Self-pay | Admitting: Specialist

## 2014-09-04 DIAGNOSIS — E119 Type 2 diabetes mellitus without complications: Secondary | ICD-10-CM

## 2014-09-04 DIAGNOSIS — M5126 Other intervertebral disc displacement, lumbar region: Secondary | ICD-10-CM

## 2014-09-04 DIAGNOSIS — R5081 Fever presenting with conditions classified elsewhere: Secondary | ICD-10-CM | POA: Diagnosis present

## 2014-09-04 DIAGNOSIS — E669 Obesity, unspecified: Secondary | ICD-10-CM

## 2014-09-04 DIAGNOSIS — M5127 Other intervertebral disc displacement, lumbosacral region: Secondary | ICD-10-CM | POA: Diagnosis not present

## 2014-09-04 LAB — BASIC METABOLIC PANEL
Anion gap: 7 (ref 5–15)
BUN: 9 mg/dL (ref 6–23)
CO2: 26 mmol/L (ref 19–32)
CREATININE: 0.46 mg/dL — AB (ref 0.50–1.10)
Calcium: 8.3 mg/dL — ABNORMAL LOW (ref 8.4–10.5)
Chloride: 99 mmol/L (ref 96–112)
GFR calc Af Amer: >90 mL/min (ref >90)
GLUCOSE: 249 mg/dL — AB (ref 70–99)
POTASSIUM: 4.2 mmol/L (ref 3.5–5.1)
Sodium: 132 mmol/L — ABNORMAL LOW (ref 135–145)

## 2014-09-04 LAB — URINALYSIS, ROUTINE W REFLEX MICROSCOPIC
BILIRUBIN URINE: NEGATIVE
Glucose, UA: >1000 mg/dL — AB
Hgb urine dipstick: NEGATIVE
Ketones, ur: NEGATIVE mg/dL
Leukocytes, UA: NEGATIVE
Nitrite: NEGATIVE
PH: 5 (ref 5.0–8.0)
PROTEIN: NEGATIVE mg/dL
Specific Gravity, Urine: 1.012 (ref 1.005–1.030)
UROBILINOGEN UA: 0.2 mg/dL (ref 0.0–1.0)

## 2014-09-04 LAB — GLUCOSE, CAPILLARY
Glucose-Capillary: 239 mg/dL — ABNORMAL HIGH (ref 70–99)
Glucose-Capillary: 306 mg/dL — ABNORMAL HIGH (ref 70–99)
Glucose-Capillary: 242 mg/dL — ABNORMAL HIGH (ref 70–99)
Glucose-Capillary: 220 mg/dL — ABNORMAL HIGH (ref 70–99)

## 2014-09-04 LAB — CBC
HEMATOCRIT: 33.7 % — AB (ref 36.0–46.0)
Hemoglobin: 10.6 g/dL — ABNORMAL LOW (ref 12.0–15.0)
MCH: 27.6 pg (ref 26.0–34.0)
MCHC: 31.5 g/dL (ref 30.0–36.0)
MCV: 87.8 fL (ref 78.0–100.0)
Platelets: 201 10*3/uL (ref 150–400)
RBC: 3.84 MIL/uL — ABNORMAL LOW (ref 3.87–5.11)
RDW: 12.7 % (ref 11.5–15.5)
WBC: 7.2 10*3/uL (ref 4.0–10.5)

## 2014-09-04 LAB — URINE MICROSCOPIC-ADD ON

## 2014-09-04 MED ORDER — DIPHENHYDRAMINE HCL 25 MG PO CAPS
25.0000 mg | ORAL_CAPSULE | Freq: Four times a day (QID) | ORAL | Status: DC | PRN
  Administered 2014-09-04: 25 mg via ORAL
  Filled 2014-09-04: qty 1

## 2014-09-04 MED ORDER — INSULIN GLARGINE 100 UNIT/ML ~~LOC~~ SOLN
5.0000 [IU] | Freq: Every day | SUBCUTANEOUS | Status: DC
  Administered 2014-09-04 – 2014-09-05 (×2): 5 [IU] via SUBCUTANEOUS
  Filled 2014-09-04 (×2): qty 0.05

## 2014-09-04 MED ORDER — METFORMIN HCL 500 MG PO TABS
500.0000 mg | ORAL_TABLET | Freq: Two times a day (BID) | ORAL | Status: DC
  Administered 2014-09-04 – 2014-09-05 (×3): 500 mg via ORAL
  Filled 2014-09-04 (×5): qty 1

## 2014-09-04 MED ORDER — PANTOPRAZOLE SODIUM 40 MG PO TBEC
40.0000 mg | DELAYED_RELEASE_TABLET | Freq: Every day | ORAL | Status: DC
  Administered 2014-09-04: 40 mg via ORAL
  Filled 2014-09-04 (×2): qty 1

## 2014-09-04 NOTE — Progress Notes (Signed)
Key Points: Use following P&T approved IV to PO non-antibiotic change policy.  Description contains the criteria that are approved Note: Policy Excludes:  Esophagectomy patientsPHARMACIST - PHYSICIAN COMMUNICATION DR:   Venetia Constableanga CONCERNING: IV to Oral Route Change Policy  RECOMMENDATION: This patient is receiving Protonix by the intravenous route.  Based on criteria approved by the Pharmacy and Therapeutics Committee, the intravenous medication(s) is/are being converted to the equivalent oral dose form(s).   DESCRIPTION: These criteria include:  The patient is eating (either orally or via tube) and/or has been taking other orally administered medications for a least 24 hours  The patient has no evidence of active gastrointestinal bleeding or impaired GI absorption (gastrectomy, short bowel, patient on TNA or NPO).  If you have questions about this conversion, please contact the Pharmacy Department  []   [phone]( [phone] )  [facility] []   [phone]( [phone] )  [facility]  []   [phone]( [phone] )  [facility] [x]   [phone]( [phone] )  [facility]  Earl ManyLegge, Albin Duckett TulelakeMarshall, [facility] IncRPH 09/04/2014 1:11 PM

## 2014-09-04 NOTE — Progress Notes (Addendum)
Inpatient Diabetes Program Recommendations  AACE/ADA: New Consensus Statement on Inpatient Glycemic Control (2013)  Target Ranges:  Prepandial:   less than 140 mg/dL      Peak postprandial:   less than 180 mg/dL (1-2 hours)      Critically ill patients:  140 - 180 mg/dL     Results for Brianna [name], Brianna [name] (MRN [id number]) as of 09/04/2014 13:52  Ref. Range 09/03/2014 09:54 09/03/2014 14:50 09/03/2014 17:17 09/03/2014 21:22  Glucose-Capillary Latest Range: 70-99 mg/dL 782254 (H) 956263 (H) 213293 (H) 238 (H)   Results for Brianna [name], Brianna [name] (MRN [id number]) as of 09/04/2014 13:52  Ref. Range 09/04/2014 07:35 09/04/2014 12:10  Glucose-Capillary Latest Range: 70-99 mg/dL 629239 (H) 528306 (H)     Patient underwent MICROLUMBAR DECOMPRESSION LUMBAR FOUR TO FIVE, LUMBAR FIVE TO SACRAL ONE (02/10).  Hospitalist consulted for High BP, Hyperglycemia.  Per MD notes, patient does not have a documented history of DM, or HTN.   **Note A1c pending.  A1c will help guide outpatient DM regimen if patient diagnosed with DM this admission.  **Note Lantus, Novolog, and Metformin started this AM.  Micah Flesher**Went to speak with patient about her elevated glucose levels.  Patient very sleepy from pain medications.  Spoke with patient briefly about her blood sugar levels and relayed that she may have a diagnosis of DM but that we need to wait for the A1c test results to come back.  Explained to patient why we initiated insulin in the hospital.  Will revisit with patient in AM if A1c results indicate positive diagnosis of DM.  **Asked RN caring for patient to order Living Well With DM booklet for patient and to have patient begin DM videos on patient education network if A1c comes back elevated.   Will follow Ambrose FinlandJeannine Johnston Abdou Stocks RN, MSN, CDE Diabetes Coordinator Inpatient Diabetes Program Team Pager: [phone] (8a-10p)

## 2014-09-04 NOTE — Evaluation (Signed)
Physical Therapy Evaluation Patient Details Name: Brianna [name] MRN: [id number] DOB: 05/20/1991 Today's Date: 09/04/2014   History of Present Illness  pt was admitted for L4-5, L5-S1 decompression, found to have Hyperglycemia, w/u for DM ogoing.  Clinical Impression  Patient tolerated ambulating  X 200' with Rw. Patient will benefit from PT to address problems listed in note below.    Follow Up Recommendations No PT follow up;Supervision - Intermittent    Equipment Recommendations  None recommended by PT    Recommendations for Other Services       Precautions / Restrictions Precautions Precautions: Back Precaution Booklet Issued: Yes (comment) Restrictions Weight Bearing Restrictions: No      Mobility  Bed Mobility Overal bed mobility: Needs Assistance Bed Mobility: Rolling;Sidelying to Sit Rolling: Mod assist Sidelying to sit: Min assist       General bed mobility comments: used bed pad to assist with rolling; pt used bedrail  Transfers Overall transfer level: Needs assistance Equipment used: Rolling walker (2 wheeled) Transfers: Sit to/from Stand Sit to Stand: Min guard Stand pivot transfers: Min assist       General transfer comment: pushed up from recliner, cues for hand placement  Ambulation/Gait Ambulation/Gait assistance: Min assist Ambulation Distance (Feet): 200 Feet Assistive device: Rolling walker (2 wheeled) Gait Pattern/deviations: Step-through pattern     General Gait Details: guarded gait with RW,   Stairs            Wheelchair Mobility    Modified Rankin (Stroke Patients Only)       Balance                                             Pertinent Vitals/Pain Pain Assessment: 0-10 Pain Score: 3  Pain Location: Back Pain Descriptors / Indicators: Sore Pain Intervention(s): Monitored during session;Premedicated before session;Repositioned    Home Living Family/patient expects to be discharged to::  Private residence Living Arrangements: Parent   Type of Home: House Home Access: Stairs to enter Entrance Stairs-Rails: None Secretary/administratorntrance Stairs-Number of Steps: 2 Home Layout: One level Home Equipment: Environmental consultantWalker - 2 wheels      Prior Function Level of Independence: Independent               Hand Dominance        Extremity/Trunk Assessment   Upper Extremity Assessment: Overall WFL for tasks assessed           Lower Extremity Assessment: Overall WFL for tasks assessed         Communication   Communication: No difficulties  Cognition Arousal/Alertness: Awake/alert Behavior During Therapy: WFL for tasks assessed/performed Overall Cognitive Status: Within Functional Limits for tasks assessed                      General Comments      Exercises        Assessment/Plan    PT Assessment Patient needs continued PT services  PT Diagnosis Difficulty walking;Acute pain   PT Problem List Decreased mobility;Obesity;Decreased knowledge of precautions;Decreased safety awareness;Decreased knowledge of use of DME;Pain  PT Treatment Interventions DME instruction;Gait training;Functional mobility training;Therapeutic activities;Therapeutic exercise;Stair training;Patient/family education   PT Goals (Current goals can be found in the Care Plan section) Acute Rehab PT Goals Patient Stated Goal: to walk without pain PT Goal Formulation: With patient/family Time For Goal Achievement: 09/07/14 Potential to Achieve Goals:  Good    Frequency Min 5X/week   Barriers to discharge        Co-evaluation               End of Session   Activity Tolerance: Patient tolerated treatment well Patient left: in chair;with call bell/phone within reach;with family/visitor present Nurse Communication: Mobility status         Time: 1610-9604 PT Time Calculation (min) (ACUTE ONLY): 17 min   Charges:   PT Evaluation $Initial PT Evaluation Tier I: 1 Procedure     PT G  CodesRada Hay 09/04/2014, 12:54 PM Blanchard Kelch PT [phone]

## 2014-09-04 NOTE — Progress Notes (Addendum)
[facility] PROGRESS NOTE  Brianna [name] ZOX:[id number] DOB: [birth year] DOA: 09/03/2014 PCP: No PCP Per Patient  Summary I have seen and examined Brianna [name] at bedside in the presence of her mother and reviewed her chart. The hospitalist service is following for hyperglycemia/elevated blood pressure. Brianna [name] is a pleasant 24 year old female without many known medical problems up to this point who was admitted by the orthopedic service for L4-S1 decompression surgery, which was performed on 09/03/2014. She was noted to have hyperglycemia and elevated blood pressures. It appears that she is diabetic as her sugars have remained over 200 mg/dl since admission. However, blood pressure has fluctuated and has been even on the low side, below 100 mmHg systolic at some point. We await hemoglobin A1c to determine the combination of hypoglycemics at the time of discharge. Given that she has morbid obesity, she may be a good candidate for a metformin combination. If in fact she has essential hypertension, she would be a good candidate for ACE inhibitor in view of the diabetes. Her mother reports that she has been coughing since the surgery. She may have aspirated. She has low-grade fever. Will therefore obtain a UA/urine culture/chest x-ray. Will add Lantus and metformin in the interim, and continue SSI, pending hemoglobin A1c. Will also obtain TSH/lipid panel. Patient will need to establish follow-up for healthcare maintenance with a primary care provider. Plan HNP (herniated nucleus pulposus), lumbar  Defer management to orthopedics Obesity/Diabetes mellitus type 2 in obese  Follow hemoglobin A1c.  Obtain TSH/lipid panel  Lantus/metformin/SSI HTN (hypertension)  Blood pressure fluctuating  Monitor and consider ACE inhibitor if persistently high Fever presenting with conditions classified elsewhere  UA/urine culture/chest x-ray  Hold off antibiotics for now DVT/GI prophylaxis Code  Status: Full code Family Communication: Discussed medical plan of care with patient's mother at the bedside Disposition Plan: Per Orthopedics   Consultants:  Armed forces operational officer.  Procedures:  MICROLUMBAR DECOMPRESSION LUMBAR FOUR TO FIVE, LUMBAR FIVE TO SACRAL ONE (N/A)  Antibiotics:  Cefazolin for surgical prophylaxis  HPI/Subjective: Complains of pain in the back and cough.  Objective: Filed Vitals:   09/04/14 0636  BP:   Pulse:   Temp: 98.9 F (37.2 C)  Resp:     Intake/Output Summary (Last 24 hours) at 09/04/14 0808 Last data filed at 09/04/14 0543  Gross per 24 hour  Intake 3273.75 ml  Output   2340 ml  Net 933.75 ml   Filed Weights   09/03/14 0847  Weight: 138.347 kg (305 lb)    Exam:   General:  Comfortable at rest. Appears somnolent.  Cardiovascular: S1-S2 normal. No murmurs. Pulse regular.  Respiratory: Good air entry bilaterally. No rhonchi or rales.  Abdomen: Soft and nontender. Normal bowel sounds. No organomegaly.  Musculoskeletal: No pedal edema   Neurological: Intact  Data Reviewed: Basic Metabolic Panel:  Recent Labs Lab 09/04/14 0516  NA 132*  K 4.2  CL 99  CO2 26  GLUCOSE 249*  BUN 9  CREATININE 0.46*  CALCIUM 8.3*   Liver Function Tests: No results for input(s): AST, ALT, ALKPHOS, BILITOT, PROT, ALBUMIN in the last 168 hours. No results for input(s): LIPASE, AMYLASE in the last 168 hours. No results for input(s): AMMONIA in the last 168 hours. CBC:  Recent Labs Lab 09/04/14 0516  WBC 7.2  HGB 10.6*  HCT 33.7*  MCV 87.8  PLT 201   Cardiac Enzymes: No results for input(s): CKTOTAL, CKMB, CKMBINDEX, TROPONINI in the last 168 hours. BNP (last 3 results)  No results for input(s): BNP in the last 8760 hours.  ProBNP (last 3 results) No results for input(s): PROBNP in the last 8760 hours.  CBG:  Recent Labs Lab 09/03/14 0954 09/03/14 1450 09/03/14 1717 09/03/14 2122 09/04/14 0735  GLUCAP  254* 263* 293* 238* 239*    Recent Results (from the past 240 hour(s))  Surgical pcr screen     Status: None   Collection Time: 08/27/14  9:25 AM  Result Value Ref Range Status   MRSA, PCR NEGATIVE NEGATIVE Final   Staphylococcus aureus NEGATIVE NEGATIVE Final    Comment:        The Xpert SA Assay (FDA approved for NASAL specimens in patients over 37 years of age), is one component of a comprehensive surveillance program.  Test performance has been validated by [facility] for patients greater than or equal to 33 year old. It is not intended to diagnose infection nor to guide or monitor treatment.      Studies: Dg Spine Portable 1 View  09/03/2014   CLINICAL DATA:  Micro lumbar decompression L4-5 and L5-S1.  EXAM: PORTABLE SPINE - 1 VIEW  COMPARISON:  09/03/2014 at 1133 hours.  FINDINGS: A single intraoperative cross-table lateral view of the lumbar spine, taken at 1349 hours, is submitted. Numbering system utilized on the comparison examination is preserved. Surgical instrument tip projects along the posterior aspect of the L4-5 interspace.  IMPRESSION: L4-5 intraoperative localization.   Electronically Signed   By: Leanna Battles M.D.   On: 09/03/2014 14:12   Dg Spine Portable 1 View  09/03/2014   CLINICAL DATA:  Surgical level L4-5, L5-S1.  EXAM: PORTABLE SPINE - 1 VIEW  COMPARISON:  09/03/2014.  To 09/2014.  FINDINGS: Posterior surgical instruments are in place with the localized instrument at the L5-S1 level.  IMPRESSION: Intraoperative localization as above.   Electronically Signed   By: Charlett Nose M.D.   On: 09/03/2014 12:48   Dg Spine Portable 1 View  09/03/2014   CLINICAL DATA:  24 year old female undergoing L4-L5 and L5-S1 posterior lumbar interbody fusion  EXAM: PORTABLE SPINE - 1 VIEW  COMPARISON:  Prior radiographs obtained earlier today at 10:56 a.m.  FINDINGS: Single cross-table lateral radiograph demonstrates soft tissue spreaders posterior to the L5-S1 level.  Metallic probes are present at the L4-L5 apophyseal joint, and at the L5-S1 apophyseal joint.  IMPRESSION: Intraoperative localization radiographs as above.   Electronically Signed   By: Malachy Moan M.D.   On: 09/03/2014 12:08   Dg Spine Portable 1 View  09/03/2014   CLINICAL DATA:  Surgical level L4-5 and L5-S1.  EXAM: PORTABLE SPINE - 1 VIEW  COMPARISON:  08/27/2014  FINDINGS: Posterior needles are directed toward the S1 vertebral body and the L4-5 interspace.  IMPRESSION: Intraoperative localization as above.   Electronically Signed   By: Charlett Nose M.D.   On: 09/03/2014 11:25    Scheduled Meds: . acidophilus  1 capsule Oral Daily  .  ceFAZolin (ANCEF) IV  3 g Intravenous 3 times per day  . docusate sodium  100 mg Oral BID  . gabapentin  300 mg Oral TID  . Influenza vac split quadrivalent PF  0.5 mL Intramuscular Tomorrow-1000  . insulin aspart  0-5 Units Subcutaneous QHS  . insulin aspart  0-9 Units Subcutaneous TID WC  . insulin glargine  5 Units Subcutaneous Daily  . pantoprazole (PROTONIX) IV  40 mg Intravenous QHS  . sodium chloride  3 mL Intravenous Q12H   Continuous  Infusions: . 0.45 % NaCl with KCl 20 mEq / L 75 mL/hr at 09/04/14 0552  . sodium chloride       Time spent: 25 minutes    Jeryl Umholtz  [facility] Pager [phone]. If 7PM-7AM, please contact night-coverage at [email or web address], password [email or web address] 09/04/2014, 8:08 AM

## 2014-09-04 NOTE — Evaluation (Signed)
Occupational Therapy Evaluation Patient Details Name: Brianna [name] MRN: [id number] DOB: 03/24/1991 Today's Date: 09/04/2014    History of Present Illness pt was admitted for L4-5, L5-S1 decompression   Clinical Impression   This 24 year old female was admitted for the above surgery.  She will benefit from skilled OT to increase safety and independence with adls.  Pt was independent with adls prior to admission and she now needs up to mod A.  Goals in acute are for min A level.    Follow Up Recommendations  No OT follow up;Supervision/Assistance - 24 hour    Equipment Recommendations   (Pt would benefit from toilet aide)    Recommendations for Other Services       Precautions / Restrictions Precautions Precautions: Back Precaution Booklet Issued: Yes (comment) Restrictions Weight Bearing Restrictions: No      Mobility Bed Mobility Overal bed mobility: Needs Assistance Bed Mobility: Rolling;Sidelying to Sit Rolling: Mod assist Sidelying to sit: Min assist       General bed mobility comments: used bed pad to assist with rolling; pt used bedrail  Transfers Overall transfer level: Needs assistance Equipment used: Rolling walker (2 wheeled) Transfers: Sit to/from UGI CorporationStand;Stand Pivot Transfers Sit to Stand: Min assist Stand pivot transfers: Min assist       General transfer comment: assistance to power up and stabilize.  Cues for back precautions    Balance                                            ADL Overall ADL's : Needs assistance/impaired             Lower Body Bathing: With adaptive equipment;Sit to/from stand;Moderate assistance       Lower Body Dressing: Moderate assistance;Sit to/from stand;With adaptive equipment   Toilet Transfer: Minimal assistance;Stand-pivot (bed to recliner)             General ADL Comments: educated pt on AE and precautions during ADLs.  Pt has a suction cup reacher at home.  Educated on toilet  aide and sock aide.  Educated on sidestepping over Air cabin crewtub ledge     Vision     Perception     Praxis      Pertinent Vitals/Pain Pain Assessment: 0-10 Pain Score: 5  Pain Location: back Pain Descriptors / Indicators: Sore Pain Intervention(s): Limited activity within patient's tolerance;Monitored during session;Premedicated before session;Repositioned;Ice applied     Hand Dominance     Extremity/Trunk Assessment Upper Extremity Assessment Upper Extremity Assessment: Overall WFL for tasks assessed           Communication Communication Communication: No difficulties   Cognition Arousal/Alertness: Awake/alert Behavior During Therapy: WFL for tasks assessed/performed Overall Cognitive Status: Within Functional Limits for tasks assessed                     General Comments       Exercises       Shoulder Instructions      Home Living Family/patient expects to be discharged to:: Private residence Living Arrangements: Parent   Type of Home: House Home Access: Stairs to enter Secretary/administratorntrance Stairs-Number of Steps: 2 Entrance Stairs-Rails: None       Bathroom Shower/Tub: Tub/shower unit Shower/tub characteristics: Engineer, building servicesCurtain Bathroom Toilet: Standard     Home Equipment: Bedside commode;Shower seat          Prior  Functioning/Environment Level of Independence: Independent             OT Diagnosis: Generalized weakness;Acute pain   OT Problem List: Decreased strength;Decreased activity tolerance;Pain;Decreased knowledge of use of DME or AE;Decreased knowledge of precautions   OT Treatment/Interventions: Self-care/ADL training;DME and/or AE instruction;Patient/family education    OT Goals(Current goals can be found in the care plan section) Acute Rehab OT Goals Patient Stated Goal: decreased pain, back to being independent OT Goal Formulation: With patient Time For Goal Achievement: 09/11/14 Potential to Achieve Goals: Good ADL Goals Pt Will Perform  Lower Body Bathing: with min assist;sit to/from stand;with adaptive equipment Pt Will Perform Lower Body Dressing: with min assist;with adaptive equipment;sit to/from stand Pt Will Transfer to Toilet: with min guard assist;ambulating;bedside commode Additional ADL Goal #1: pt will perform bed mobility with min A from flat bed in preparation for adls  OT Frequency: Min 2X/week   Barriers to D/C:            Co-evaluation              End of Session    Activity Tolerance:  (HR 101-120) Patient left: in bed;with call bell/phone within reach   Time: 0922-0948 OT Time Calculation (min): 26 min Charges:  OT General Charges $OT Visit: 1 Procedure OT Evaluation $Initial OT Evaluation Tier I: 1 Procedure OT Treatments $Self Care/Home Management : 8-22 mins G-Codes: OT G-codes **NOT FOR INPATIENT CLASS** Functional Assessment Tool Used: clinical observation and judgment Functional Limitation: Self care Self Care Current Status (Z6109): At least 40 percent but less than 60 percent impaired, limited or restricted Self Care Goal Status (U0454): At least 20 percent but less than 40 percent impaired, limited or restricted  [facility] [place] 09/04/2014, 10:22 AM  Marica Otter, OTR/L [phone] 09/04/2014

## 2014-09-04 NOTE — Progress Notes (Signed)
Subjective: 1 Day Post-Op Procedure(s) (LRB): MICROLUMBAR DECOMPRESSION LUMBAR FOUR TO FIVE, LUMBAR FIVE TO SACRAL ONE (N/A) Patient reports pain as moderate to severe. Tearful this AM. Reports pain in lower back and buttocks bilaterally. She does feel her leg pain is already better. Denies N/V, fever, chills, HA, numbness. Foley still in place. Her mother Corrie DandyMary is here with her. Seen by myself and Dr. Shelle IronBeane.  Objective: Vital signs in last 24 hours: Temp:  [98 F (36.7 C)-100.2 F (37.9 C)] 99.3 F (37.4 C) (02/11 1000) Pulse Rate:  [95-113] 112 (02/11 1000) Resp:  [15-22] 18 (02/11 1000) BP: (96-153)/(54-82) 124/80 mmHg (02/11 1000) SpO2:  [97 %-100 %] 97 % (02/11 1000)  Intake/Output from previous day: 02/10 0701 - 02/11 0700 In: 3273.8 [P.O.:480; I.V.:2793.8] Out: 2340 [Urine:2200; Blood:140] Intake/Output this shift: Total I/O In: 240 [P.O.:240] Out: -    Recent Labs  09/04/14 0516  HGB 10.6*    Recent Labs  09/04/14 0516  WBC 7.2  RBC 3.84*  HCT 33.7*  PLT 201    Recent Labs  09/04/14 0516  NA 132*  K 4.2  CL 99  CO2 26  BUN 9  CREATININE 0.46*  GLUCOSE 249*  CALCIUM 8.3*   No results for input(s): LABPT, INR in the last 72 hours.  Neurologically intact ABD soft Neurovascular intact Sensation intact distally Intact pulses distally Dorsiflexion/Plantar flexion intact Incision: dressing C/D/I and no drainage No cellulitis present Compartment soft no calf pain or sign of DVT  Assessment/Plan: 1 Day Post-Op Procedure(s) (LRB): MICROLUMBAR DECOMPRESSION LUMBAR FOUR TO FIVE, LUMBAR FIVE TO SACRAL ONE (N/A) Advance diet Up with therapy D/C IV fluids  Pain control Remove foley, if unable to void in 6 hrs bladder scan, straight cath prn Up with PT for ambulation as pain improves Appreciate hospitalist consult and input, awaiting A1C, Lantus and metformin started for DM Anticipate D/C tomorrow if pain is well controlled  Javen Ridings  M. 09/04/2014, 10:31 AM

## 2014-09-05 DIAGNOSIS — M5127 Other intervertebral disc displacement, lumbosacral region: Secondary | ICD-10-CM | POA: Diagnosis not present

## 2014-09-05 LAB — COMPREHENSIVE METABOLIC PANEL
ALK PHOS: 48 U/L (ref 39–117)
ALT: 20 U/L (ref 0–35)
ANION GAP: 9 (ref 5–15)
AST: 14 U/L (ref 0–37)
Albumin: 3.5 g/dL (ref 3.5–5.2)
BILIRUBIN TOTAL: 0.8 mg/dL (ref 0.3–1.2)
BUN: 8 mg/dL (ref 6–23)
CHLORIDE: 98 mmol/L (ref 96–112)
CO2: 26 mmol/L (ref 19–32)
CREATININE: 0.41 mg/dL — AB (ref 0.50–1.10)
Calcium: 8.5 mg/dL (ref 8.4–10.5)
GFR calc Af Amer: >90 mL/min (ref >90)
GFR calc non Af Amer: >90 mL/min (ref >90)
Glucose, Bld: 246 mg/dL — ABNORMAL HIGH (ref 70–99)
Potassium: 3.5 mmol/L (ref 3.5–5.1)
Sodium: 133 mmol/L — ABNORMAL LOW (ref 135–145)
Total Protein: 7.4 g/dL (ref 6.0–8.3)

## 2014-09-05 LAB — HEMOGLOBIN A1C
Hgb A1c MFr Bld: 11.3 % — ABNORMAL HIGH (ref 4.8–5.6)
MEAN PLASMA GLUCOSE: 278 mg/dL

## 2014-09-05 LAB — GLUCOSE, CAPILLARY
GLUCOSE-CAPILLARY: 232 mg/dL — AB (ref 70–99)
GLUCOSE-CAPILLARY: 245 mg/dL — AB (ref 70–99)

## 2014-09-05 LAB — CBC
HCT: 33.1 % — ABNORMAL LOW (ref 36.0–46.0)
Hemoglobin: 10.5 g/dL — ABNORMAL LOW (ref 12.0–15.0)
MCH: 28.1 pg (ref 26.0–34.0)
MCHC: 31.7 g/dL (ref 30.0–36.0)
MCV: 88.5 fL (ref 78.0–100.0)
Platelets: 208 10*3/uL (ref 150–400)
RBC: 3.74 MIL/uL — ABNORMAL LOW (ref 3.87–5.11)
RDW: 12.6 % (ref 11.5–15.5)
WBC: 9.3 10*3/uL (ref 4.0–10.5)

## 2014-09-05 LAB — LIPID PANEL
Cholesterol: 162 mg/dL (ref 0–200)
HDL: 47 mg/dL (ref >39)
LDL Cholesterol: 100 mg/dL — ABNORMAL HIGH (ref 0–99)
Total CHOL/HDL Ratio: 3.4 RATIO
Triglycerides: 76 mg/dL (ref <150)
VLDL: 15 mg/dL (ref 0–40)

## 2014-09-05 LAB — URINE CULTURE
Colony Count: NO GROWTH
Culture: NO GROWTH

## 2014-09-05 LAB — TSH: TSH: 2.407 u[IU]/mL (ref 0.350–4.500)

## 2014-09-05 MED ORDER — INSULIN STARTER KIT- SYRINGES (ENGLISH)
1.0000 | Freq: Once | Status: AC
  Administered 2014-09-05: 1
  Filled 2014-09-05: qty 1

## 2014-09-05 MED ORDER — FREESTYLE LANCETS MISC: Status: DC

## 2014-09-05 MED ORDER — INSULIN SYRINGES (DISPOSABLE) U-100 0.3 ML MISC: 1.0000 | Status: DC

## 2014-09-05 MED ORDER — INSULIN GLARGINE 100 UNIT/ML ~~LOC~~ SOLN: 10.0000 [IU] | Freq: Every day | SUBCUTANEOUS | Status: DC

## 2014-09-05 MED ORDER — LIVING WELL WITH DIABETES BOOK
Freq: Once | Status: DC
  Filled 2014-09-05: qty 1

## 2014-09-05 MED ORDER — LEVOFLOXACIN 750 MG PO TABS: 750.0000 mg | ORAL_TABLET | Freq: Every day | ORAL | Status: DC

## 2014-09-05 MED ORDER — LEVOFLOXACIN IN D5W 750 MG/150ML IV SOLN
750.0000 mg | INTRAVENOUS | Status: DC
  Administered 2014-09-05: 750 mg via INTRAVENOUS
  Filled 2014-09-05: qty 150

## 2014-09-05 MED ORDER — INSULIN STARTER KIT- SYRINGES (ENGLISH): 1.0000 | Freq: Once | Status: DC

## 2014-09-05 MED ORDER — INSULIN GLARGINE 100 UNIT/ML SOLOSTAR PEN: 10.0000 [IU] | PEN_INJECTOR | Freq: Every day | SUBCUTANEOUS | Status: DC

## 2014-09-05 MED ORDER — METFORMIN HCL 500 MG PO TABS: 500.0000 mg | ORAL_TABLET | Freq: Two times a day (BID) | ORAL | Status: DC

## 2014-09-05 MED ORDER — GLUCOSE BLOOD VI STRP: ORAL_STRIP | Status: DC

## 2014-09-05 NOTE — Op Note (Signed)
NAMEHARRIS, KISTLER NO.:  [id number]  MEDICAL RECORD NO.:  [id number]  LOCATION:  1617                         FACILITY:  [facility]  PHYSICIAN:  Jene Every, M.D.    DATE OF BIRTH:  [birth year]  DATE OF PROCEDURE:  09/04/2014 DATE OF DISCHARGE:                              OPERATIVE REPORT   PREOPERATIVE DIAGNOSES: 1. Herniated nucleus pulposus, L4-5 and L5-S1. 2. Disk degeneration, L4-5 and L5-S1. 3. Morbid obesity.  BMI 51.  POSTOPERATIVE DIAGNOSES: 1. Herniated nucleus pulposus, L4-5 and L5-S1. 2. Disk degeneration, L4-5 and L5-S1. 3. Morbid obesity.  BMI 51. 4. Attenuated dura.  PROCEDURES PERFORMED: 1. Microlumbar decompression, L4-5 and L5-S1, right. 2. Foraminotomies of L4, L5, S1, right. 3. Excision of chronic and acute disk herniations at L4-L5 and L5-S1. 4. Application of DuraGen patch.  Technical difficulty increased due to the patient's elevated BMI.  BRIEF HISTORY:  This is a 24 year old female who has had chronica back pain and mild radicular pain, treated in the past.  She had an acute exacerbation, presented to the office with severe right lower extremity radicular pain, numbness and weakness, particularly in dorsiflexion. Decreased sensation at the L5-S1 dermatome.  She underwent an urgent MRI indicating an extruded fragment at L5-S1, migrating caudad. Degeneration was noted at L5-S1 to the central and to the right disk herniation at L4-5 as well, compressing the 5 root.  She had severe pain and had been refractory to initial conservative treatment.  There was severe compression of the thecal sac noted with extruded fragment.  We felt the presence of the neurologic deficit was wider and proceed urgently with decompression at L4-5 and at L5-S1.  Her elevated BMI was of concern.  We did discuss risks and benefits including bleeding, infection, damage to neurovascular structures, DVT, PE, anesthetic complications, CSF leakage, need for  fusion in the future, etc.  TECHNIQUE:  With the patient in supine position, after induction of adequate general anesthesia, 3 g of Kefzol, she was placed prone on the [place] frame.  All bony prominences were well padded.  Lumbar region was prepped and draped in usual sterile fashion.  Two 18-gauge spinal needles were utilized to localize the L4-5 and L5-S1 interspace confirmed with x-rays.  Incision was made from the spinous process of L4 just to below S1.  Subcutaneous tissue was dissected.  Electrocautery was utilized to achieve hemostasis.  There was a fairly ample subcutaneous adipose layer.  We identified the fascia and infiltrated the subfascial region with 0.25% Marcaine with epinephrine.  The dorsal lumbar fascia was divided on the right side of the interspinous ligament.  Paraspinous musculature was elevated from L4-5 and at L5-S1. The deep McCullough retractors were placed and still slightly short. Confirmatory radiograph obtained and operating microscope was draped and brought on the surgical field.  Attention was turned first at L5-S1.  A straight curette was utilized to detach the ligamentum flavum from the cephalad edge of S1.  From that, I then placed a neuroprobe to protect the thecal sac and THE nerve root and performed a generous S1 foraminotomy with a 2 and then subsequently with a 3-mm Kerrison.  The cephalad portion of the right hemilamina was  enlarged as well.  After the foraminotomy of S1 was performed, I gently then removed the ligamentum flavum from the interspace.  There was severe compression of the thecal sac and the nerve root noted.  The hemilaminotomy of the caudad edge of L5 was then performed to the point, which detached the ligamentum flavum.  Decided excision of the ligamentum flavum. Following this, I then enlarged the laminotomy medially undercutting some of the lamina and removing the ligamentum just across the midline as well enlarging the  available space for the thecal sac.  We then decompressed the lateral recess to the medial border of the pedicle using a 2-mm Kerrison.  This enabled [place] to access the herniated disk lateral and ventral to the thecal sac without retraction of the thecal sac.  Thecal sac was protected.  I then performed an annulotomy with a 15-blade.  Portion of this rupture though was a hardened disk and calcified.  There was adhesions noted.  We spent a conservative amount of time meticulously developing the plane between the thecal sac, the ruptured disk and the calcified disk.  Multiple free fragments were gently mobilized from the submandibular space and distal to the disk space.  Multiple large fragments were noted.  Osteophyte was removed with a 2-mm Kerrison.  We further mobilized this cephalad using a combination of an Epstein and a Woodson retractor to deliver the disk herniation and the calcified disk into the disk space, which was then retrieved with a straight and up-biting pituitary.  Following this, in retrieval of multiple fragments, I felt decompression of thecal sac satisfactory.  Copiously irrigated the disk space with antibiotic irrigation and additional fragments were removed from the disk space. This was all sent to Pathology.  It was of ample specimen that was consistent with that seen on the MRI.  Again, it appeared to be acute on chronic situation with disk degeneration osteophytes off the endplates as well as herniated disk.  No evidence of CSF leakage or active bleeding was noted and there was 1 cm excursion of the S1 nerve root medial to pedicle without tension.  The foraminotomy of L5 was performed as well.  The neuroprobe passed freely of both foramen.  Confirmatory radiographs obtained at the disk space.  Again, she had a disk herniation at L4-5 to the right.  We felt that it was necessary to decompress this as well.  We performed hemilaminotomy of the caudad edge of L4.  We  detached the ligamentum from the cephalad edge of L5.  We placed the neuro patty beneath the ligamentum flavum and removed the ligamentum flavum from the interspace.  There was some adhesions of the thecal sac in the lateral recess and with her facet hypertrophy, again this appeared to be acute on chronic as well.  We mobilized this with a Facilities manager.  Protected it with a neuro patty, could be a small area of attenuated dura there with no CSF leakage.  This was protected with D'Errico.  We performed an annulotomy and we removed disk material from the disk space with straight and up-biting pituitary against the calcification noted here on the outer portion of the disk herniation, this was mobilized with an Epstein and removed with pituitary.  There was good decompression of the thecal sac and the L5 root.  Foraminotomy of L5 was performed.  Confirmatory radiograph obtained no evidence of CSF leakage or active bleeding.  The area of attenuation measured about 2 mm.  I did place a DuraGen  patch over this, measured about 2 cm x 1 cm and then thrombin-soaked Gelfoam over that after copious irrigation with antibiotic irrigation and the disk space as well.  Inspection revealed no evidence of CSF leakage or active bleeding.  We performed a Valsalva, no CSF leakage.  Copiously irrigated the all wounds.  No active bleeding.  I then removed the [facility] Le[facility]ull[facility] retractors, paraspinous muscle was inspected, no active bleeding.  I closed the fascia with 1 running V- Loc.  The subcutaneous adipose tissue with multiple 2-0 layers and the skin with staples.  Wound was dressed sterilely.  Placed supine on the hospital bed, extubated without difficulty, and transported to the recovery room in satisfactory condition.  The patient tolerated the procedure well.  No complications.  Assistant, Lanna PocheJacqueline Bissell, PA was used for patient positioning, gentle intermittent neuro traction, and closure.  The  patient preoperatively was noted to have an elevated glucose.  We will initiate a consult Medicine for exploration of diabetes.  At the conclusion, blood loss was 100 mL.     Jene EveryJeffrey Aylani Spurlock, M.D.     Cordelia PenJB/MEDQ  D:  09/04/2014  T:  09/05/2014  Job:  [id number]

## 2014-09-05 NOTE — Progress Notes (Signed)
[facility] PROGRESS NOTE  Brianna [name] WUJ:[id number] DOB: Dec 19, 1990 DOA: 09/03/2014 PCP: Harlow Asa, MD  Summary The hospitalist service is following for hyperglycemia/elevated blood pressure. Brianna [name] is a pleasant 24 year old female without many known medical problems up to this point who was admitted by the orthopedic service for L4-S1 decompression surgery, which was performed on 09/03/2014. She was noted to have hyperglycemia and elevated blood pressures. She is diabetic as her sugars have remained over 200 mg/dl(and hemoglobin N5A is 21.3) since admission. However, blood pressure has fluctuated and has been even on the low side, below 100 mmHg systolic at some point. Patient therefore needs an Insulin combination. Given that she has morbid obesity, she is a good candidate for a metformin as well. If in fact she has essential hypertension, she would be a good candidate for ACE inhibitor in view of the diabetes. Her blood pressure has been fluctuating and will need follow up by her PCP to determine need for antihypertensives. Chest x-ray/UA unremarkable. However given the fact that she is diabetic, it is probably better to cover her with empiric antibiotics. As patient is scheduled for discharge today, will write prescription for Lantus/metformin/Levaquin and have this followed up by her primary care provider. Patient will need to establish follow-up for healthcare maintenance with a primary care provider. I have discussed this care plan with patient's mother. Plan HNP (herniated nucleus pulposus), lumbar  Defer management to orthopedics Obesity/Diabetes mellitus type 2 in obese  Hemoglobin A1c 11.3.  TSH/lipid panel unremarkable  Lantus/metformin/SSI HTN (hypertension)  Blood pressure fluctuating  Monitor and consider ACE inhibitor if persistently high-defer to patient's primary care provider Fever presenting with conditions classified elsewhere  UA/urine culture/chest  x-ray-unrevealing  Levaquin DVT/GI prophylaxis Code Status: Full code Family Communication: Discussed medical plan of care with patient's mother at the bedside Disposition Plan: Per Orthopedics-likely home today   Consultants:  Hospitalist service consulting.  Procedures:  MICROLUMBAR DECOMPRESSION LUMBAR FOUR TO FIVE, LUMBAR FIVE TO SACRAL ONE (N/A)  Antibiotics:  Cefazolin for surgical prophylaxis  Levaquin 09/05/14>  HPI/Subjective: Says cough is better.  Objective: Filed Vitals:   09/05/14 0550  BP: 147/96  Pulse: 109  Temp: 98.8 F (37.1 C)  Resp: 20    Intake/Output Summary (Last 24 hours) at 09/05/14 0915 Last data filed at 09/05/14 0550  Gross per 24 hour  Intake    550 ml  Output   2350 ml  Net  -1800 ml   Filed Weights   09/03/14 0847  Weight: 138.347 kg (305 lb)    Exam:   General:  Comfortable at rest.  Cardiovascular: S1-S2 normal. No murmurs. Pulse regular.  Respiratory: Good air entry bilaterally. No rhonchi or rales.  Abdomen: Soft and nontender. Normal bowel sounds. No organomegaly.  Musculoskeletal: No pedal edema   Neurological: Intact  Data Reviewed: Basic Metabolic Panel:  Recent Labs Lab 09/04/14 0516 09/05/14 0550  NA 132* 133*  K 4.2 3.5  CL 99 98  CO2 26 26  GLUCOSE 249* 246*  BUN 9 8  CREATININE 0.46* 0.41*  CALCIUM 8.3* 8.5   Liver Function Tests:  Recent Labs Lab 09/05/14 0550  AST 14  ALT 20  ALKPHOS 48  BILITOT 0.8  PROT 7.4  ALBUMIN 3.5   No results for input(s): LIPASE, AMYLASE in the last 168 hours. No results for input(s): AMMONIA in the last 168 hours. CBC:  Recent Labs Lab 09/04/14 0516 09/05/14 0550  WBC 7.2 9.3  HGB 10.6* 10.5*  HCT 33.7*  33.1*  MCV 87.8 88.5  PLT 201 208   Cardiac Enzymes: No results for input(s): CKTOTAL, CKMB, CKMBINDEX, TROPONINI in the last 168 hours. BNP (last 3 results) No results for input(s): BNP in the last 8760 hours.  ProBNP (last 3  results) No results for input(s): PROBNP in the last 8760 hours.  CBG:  Recent Labs Lab 09/04/14 0735 09/04/14 1210 09/04/14 1710 09/04/14 2226 09/05/14 0742  GLUCAP 239* 306* 242* 220* 232*    Recent Results (from the past 240 hour(s))  Surgical pcr screen     Status: None   Collection Time: 08/27/14  9:25 AM  Result Value Ref Range Status   MRSA, PCR NEGATIVE NEGATIVE Final   Staphylococcus aureus NEGATIVE NEGATIVE Final    Comment:        The Xpert SA Assay (FDA approved for NASAL specimens in patients over 41 years of age), is one component of a comprehensive surveillance program.  Test performance has been validated by [facility] for patients greater than or equal to 39 year old. It is not intended to diagnose infection nor to guide or monitor treatment.      Studies: Dg Chest Port 1 View  09/04/2014   CLINICAL DATA:  Cough and chest congestion.  EXAM: PORTABLE CHEST - 1 VIEW  COMPARISON:  None.  FINDINGS: The heart size and mediastinal contours are within normal limits. Both lungs are clear. The visualized skeletal structures are unremarkable.  IMPRESSION: No active disease.   Electronically Signed   By: Myles Rosenthal M.D.   On: 09/04/2014 09:15   Dg Spine Portable 1 View  09/03/2014   CLINICAL DATA:  Micro lumbar decompression L4-5 and L5-S1.  EXAM: PORTABLE SPINE - 1 VIEW  COMPARISON:  09/03/2014 at 1133 hours.  FINDINGS: A single intraoperative cross-table lateral view of the lumbar spine, taken at 1349 hours, is submitted. Numbering system utilized on the comparison examination is preserved. Surgical instrument tip projects along the posterior aspect of the L4-5 interspace.  IMPRESSION: L4-5 intraoperative localization.   Electronically Signed   By: Leanna Battles M.D.   On: 09/03/2014 14:12   Dg Spine Portable 1 View  09/03/2014   CLINICAL DATA:  Surgical level L4-5, L5-S1.  EXAM: PORTABLE SPINE - 1 VIEW  COMPARISON:  09/03/2014.  To 09/2014.  FINDINGS:  Posterior surgical instruments are in place with the localized instrument at the L5-S1 level.  IMPRESSION: Intraoperative localization as above.   Electronically Signed   By: Charlett Nose M.D.   On: 09/03/2014 12:48   Dg Spine Portable 1 View  09/03/2014   CLINICAL DATA:  24 year old female undergoing L4-L5 and L5-S1 posterior lumbar interbody fusion  EXAM: PORTABLE SPINE - 1 VIEW  COMPARISON:  Prior radiographs obtained earlier today at 10:56 a.m.  FINDINGS: Single cross-table lateral radiograph demonstrates soft tissue spreaders posterior to the L5-S1 level. Metallic probes are present at the L4-L5 apophyseal joint, and at the L5-S1 apophyseal joint.  IMPRESSION: Intraoperative localization radiographs as above.   Electronically Signed   By: Malachy Moan M.D.   On: 09/03/2014 12:08   Dg Spine Portable 1 View  09/03/2014   CLINICAL DATA:  Surgical level L4-5 and L5-S1.  EXAM: PORTABLE SPINE - 1 VIEW  COMPARISON:  08/27/2014  FINDINGS: Posterior needles are directed toward the S1 vertebral body and the L4-5 interspace.  IMPRESSION: Intraoperative localization as above.   Electronically Signed   By: Charlett Nose M.D.   On: 09/03/2014 11:25  Scheduled Meds: . acidophilus  1 capsule Oral Daily  . docusate sodium  100 mg Oral BID  . gabapentin  300 mg Oral TID  . insulin aspart  0-5 Units Subcutaneous QHS  . insulin aspart  0-9 Units Subcutaneous TID WC  . insulin glargine  5 Units Subcutaneous Daily  . levofloxacin (LEVAQUIN) IV  750 mg Intravenous Q24H  . living well with diabetes book   Does not apply Once  . metFORMIN  500 mg Oral BID WC  . pantoprazole  40 mg Oral QHS  . sodium chloride  3 mL Intravenous Q12H   Continuous Infusions: . sodium chloride       Time spent: 15 minutes    Darika Ildefonso  [facility] Pager [phone]. If 7PM-7AM, please contact night-coverage at [email or web address], password [email or web address] 09/05/2014, 9:15 AM

## 2014-09-05 NOTE — Plan of Care (Signed)
Problem: Consults Goal: Diagnosis-Diabetes Mellitus New onset DM

## 2014-09-05 NOTE — Progress Notes (Signed)
OT Cancellation Note  Patient Details Name: Brianna [name] MRN: [id number] DOB: 07/08/1991   Cancelled Treatment:    Reason Eval/Treat Not Completed: Other (comment).  Pt feels comfortable with toilet transfers and mother will assist with ADLs.  Educated on stepping onto a sturdy stool to get into a high bed. Pt did not want to practice bed mobility today--she was already up and she got up with [facility] raised.  Reviewed verbally sequence and recommended mother folds a sheet into thirds and lay horizontal to assist with rolling as needed.    Lonza Shimabukuro 09/05/2014, 12:39 PM  Marica OtterMaryellen Lisia [place], OTR/L [phone] 09/05/2014

## 2014-09-05 NOTE — Progress Notes (Signed)
   09/04/14 1253  PT G-Codes **NOT FOR INPATIENT CLASS**  Functional Assessment Tool Used clinical judgement  Functional Limitation Mobility: Walking and moving around  Mobility: Walking and Moving Around Current Status (Z6109(G8978) CI  Mobility: Walking and Moving Around Goal Status (U0454(G8979) CI

## 2014-09-05 NOTE — Progress Notes (Addendum)
Inpatient Diabetes Program Recommendations  AACE/ADA: New Consensus Statement on Inpatient Glycemic Control (2013)  Target Ranges:  Prepandial:   less than 140 mg/dL      Peak postprandial:   less than 180 mg/dL (1-2 hours)      Critically ill patients:  140 - 180 mg/dL    Results for Brianna [name], BOHNSACK (MRN [id number]) as of 09/05/2014 10:48  Ref. Range 09/04/2014 07:35 09/04/2014 12:10 09/04/2014 17:10 09/04/2014 22:26  Glucose-Capillary Latest Range: 70-99 mg/dL 239 (H) 306 (H) 242 (H) 220 (H)    Patient underwent MICROLUMBAR DECOMPRESSION LUMBAR FOUR TO FIVE, LUMBAR FIVE TO SACRAL ONE (02/10).  Hospitalist consulted for High BP, Hyperglycemia. Per MD notes, patient does not have a documented history of DM, or HTN.   **Patient diagnosed with DM this admission.  **A1c 11.3%.  Patient to d/c home on Lantus daily and Metformin bid.  **Spoke with pt about new diagnosis.  Discussed A1C results with her and explained what an A1C is, basic pathophysiology of DM Type 2, basic home care, basic diabetes diet nutrition principles, importance of checking CBGs and maintaining good CBG control to prevent long-term and short-term complications.  Reviewed signs and symptoms of hyperglycemia and hypoglycemia and how to treat hypoglycemia at home.  Also reviewed blood sugar goals at home.  Discussed how to take Lantus and Metformin at home and also reviewed possible side effects.  **Also discussed DM diet information with patient.  Encouraged patient to avoid beverages with sugar (regular soda, sweet tea, lemonade, fruit juice) and to consume mostly water.  Discussed what foods contain carbohydrates and how carbohydrates affect the body's blood sugar levels.  Encouraged patient to be careful with her portion sizes (especially grains, starchy vegetables, and fruits).   **RNs to provide ongoing basic DM education at bedside with this patient.  Have ordered educational booklet, insulin starter kit, and DM videos.   Have also placed RD consult for DM diet education for this patient.  **Educated patient and patient's Mom on insulin pen use at home.  Reviewed all steps if insulin pen including attachment of needle, 2-unit air shot, dialing up dose, giving injection, removing needle, disposal of sharps, storage of unused insulin, disposal of insulin etc.  Patient able to provide successful return demonstration.  Also reviewed troubleshooting with insulin pen.  MD to give patient Rxs for insulin pens and insulin pen needles.  **Encouraged patient to attend free DM classes at [facility] in [place] after d/c.  Also gave patient information on purchasing an inexpensive CBG meter and strips at [facility] in case her CBG meter with her insurance is too expensive.  Meter is $16 and a box of 50 strips is $9.    Will follow Wyn Quaker RN, MSN, CDE Diabetes Coordinator Inpatient Diabetes Program Team Pager: [phone] (8a-10p)

## 2014-09-05 NOTE — Care Management Note (Signed)
    Page 1 of 1   09/05/2014     1:34:01 PM CARE MANAGEMENT NOTE 09/05/2014  Patient:  Brianna [name],Brianna [name]   Account Number:  [id number]  Date Initiated:  09/04/2014  Documentation initiated by:  [facility]ptlJEFFRIES,Brianna  Subjective/Objective Assessment:   adm: MICROLUMBAR DECOMPRESSION LUMBAR FOUR TO FIVE, LUMBAR FIVE TO SACRAL ONE (N/A)     Action/Plan:   discharge planning   Anticipated DC Date:  09/05/2014   Anticipated DC Plan:  HOME/SELF CARE      DC Planning Services  CM consult  PCP issues      Choice offered to / List presented to:             Status of service:  Completed, signed off Medicare Important Message given?   (If response is "NO", the following Medicare IM given date fields will be blank) Date Medicare IM given:   Medicare IM given by:   Date Additional Medicare IM given:   Additional Medicare IM given by:    Discharge Disposition:  HOME/SELF CARE  Per UR Regulation:  Reviewed for med. necessity/level of care/duration of stay  If discussed at Long Length of Stay Meetings, dates discussed:    Comments:  09-05-14 Brianna IshiharaSuzanne Alvetta Hidrogo RN CM [phone] Spoke with patient and mother at bedside. They have arrange for PCP f/u. No further needs assessed.  09/04/14 13:40 CM notes no HH orders and does not anticipate any CM needs but will continue to monior.  Brianna [name], BSn, Cm [phone].

## 2014-09-05 NOTE — Progress Notes (Signed)
Physical Therapy Treatment Patient Details Name: Brianna [name] MRN: [id number] DOB: 05/08/1991 Today's Date: 09/05/2014    History of Present Illness pt was admitted for L4-5, L5-S1 decompression, found to have Hyperglycemia, w/u for DM ogoing.    PT Comments    Patient is doing much better today. Plans for DC.  Follow Up Recommendations  No PT follow up;Supervision - Intermittent     Equipment Recommendations  None recommended by PT    Recommendations for Other Services       Precautions / Restrictions Precautions Precautions: Back    Mobility  Bed Mobility                  Transfers   Equipment used: Rolling walker (2 wheeled)   Sit to Stand: Supervision         General transfer comment: pushed up from recliner, cues for hand placement  Ambulation/Gait Ambulation/Gait assistance: Supervision Ambulation Distance (Feet): 400 Feet Assistive device: Rolling walker (2 wheeled) Gait Pattern/deviations: Step-to pattern     General Gait Details: guarded gait with RW,    Stairs Stairs: Yes Stairs assistance: Min assist Stair Management: One rail Right;Forwards Number of Stairs: 2 General stair comments: cues for safety, pt has no rails, encouraged  to haold a hand  Wheelchair Mobility    Modified Rankin (Stroke Patients Only)       Balance                                    Cognition Arousal/Alertness: Awake/alert                          Exercises      General Comments        Pertinent Vitals/Pain Pain Score: 3  Pain Location: back Pain Descriptors / Indicators: Sore Pain Intervention(s): Monitored during session;Premedicated before session    Home Living                      Prior Function            PT Goals (current goals can now be found in the care plan section) Progress towards PT goals: Progressing toward goals    Frequency  Min 5X/week    PT Plan Current plan remains  appropriate    Co-evaluation             End of Session   Activity Tolerance: Patient tolerated treatment well Patient left: in chair;with call bell/phone within reach;with family/visitor present     Time: [id number] PT Time Calculation (min) (ACUTE ONLY): 17 min  Charges:  $Gait Training: 8-22 mins                    G Codes:      Rada HayHill, Liseth Wann Elizabeth 09/05/2014, 1:19 PM Blanchard KelchKaren Alexandr Oehler PT [phone]

## 2014-09-05 NOTE — Progress Notes (Signed)
   09/05/14 1318  PT G-Codes **NOT FOR INPATIENT CLASS**  Mobility: Walking and Moving Around Goal Status (W0981(G8979) CI  Mobility: Walking and Moving Around Discharge Status [phone] CI

## 2014-09-05 NOTE — Plan of Care (Signed)
Problem: Food- and Nutrition-Related Knowledge Deficit (NB-1.1) Goal: Nutrition education Formal process to instruct or train a patient/client in a skill or to impart knowledge to help patients/clients voluntarily manage or modify food choices and eating behavior to maintain or improve health. Outcome: Completed/Met Date Met:  09/05/14 RD consulted for diet education regarding diabetes.  Pt newly diagnosed with DM upon admission. A1c is 11.3. Pt familiar with CHO sources, and has not been drinking sweetened beverages recently. Mom at bedside.  DI provided "Type 2 Diabetes Nutrition Therapy" handout from the ADA. Discussed different sources of carbohydrates, and the difference between whole-grain and white bread. Explained how complex carbohydrates keep blood sugar more stable than simple carbohydrates.   Provided examples of appropriate serving sizes, and MyPlate recommendations. Explained the importance of eating consistently throughout the day. Also provided list of healthy snack options to consume in between meals. Teach back method used.  Expect fair compliance.  Body mass index is 52.8 kg/(m^2). Pt meets criteria for obesity class II based on current BMI.  Current diet order is CHO Modified. Labs and medications reviewed. No further nutrition interventions warranted at this time. If additional nutrition issues arise, please re-consult RD.  Wynona Dove, MS Dietetic Intern Pager: [phone]

## 2014-09-05 NOTE — Progress Notes (Addendum)
Subjective: 2 Days Post-Op Procedure(s) (LRB): MICROLUMBAR DECOMPRESSION LUMBAR FOUR TO FIVE, LUMBAR FIVE TO SACRAL ONE (N/A) Patient reports pain as moderate.   Pt resting in bed this morning, her mother Brianna [name] is in the room with her. Reports her pain is doing better. Still having some bilateral buttock pain, lower back pain, but legs are feeling good. She received Percocet last night, Norco this morning. She was nauseous yesterday but did not feel it was due to pain medication, more to gas. She has been voiding without difficulty since foley removal. No BM yet. She does feel ready to go home later today. Dr. Loran SentersSteven [name] has agreed to see her in follow up for DM after Dr. Shelle [name] called yesterday to get her in for an appt.  Objective: Vital signs in last 24 hours: Temp:  [98.8 F (37.1 C)-100.3 F (37.9 C)] 98.8 F (37.1 C) (02/12 0550) Pulse Rate:  [109-115] 109 (02/12 0550) Resp:  [18-20] 20 (02/12 0550) BP: (124-147)/(60-96) 147/96 mmHg (02/12 0550) SpO2:  [97 %-100 %] 99 % (02/12 0550)  Intake/Output from previous day: 02/11 0701 - 02/12 0700 In: 790 [P.O.:480; I.V.:310] Out: 2350 [Urine:2350] Intake/Output this shift:     Recent Labs  09/04/14 0516 09/05/14 0550  HGB 10.6* 10.5*    Recent Labs  09/04/14 0516 09/05/14 0550  WBC 7.2 9.3  RBC 3.84* 3.74*  HCT 33.7* 33.1*  PLT 201 208    Recent Labs  09/04/14 0516 09/05/14 0550  NA 132* 133*  K 4.2 3.5  CL 99 98  CO2 26 26  BUN 9 8  CREATININE 0.46* 0.41*  GLUCOSE 249* 246*  CALCIUM 8.3* 8.5   No results for input(s): LABPT, INR in the last 72 hours.  Neurologically intact ABD soft Neurovascular intact Sensation intact distally Intact pulses distally Dorsiflexion/Plantar flexion intact Incision: dressing C/D/I and no drainage No cellulitis present Compartment soft no sign of DVT  Assessment/Plan: 2 Days Post-Op Procedure(s) (LRB): MICROLUMBAR DECOMPRESSION LUMBAR FOUR TO FIVE, LUMBAR FIVE TO  SACRAL ONE (N/A) Advance diet Up with therapy D/C IV fluids  Discussed D/C instructions, dressing instructions, Lspine precautions Plan D/C home later today if okay with hospitalist service given her hyperglycemia Will discuss with Dr. Shelle [name] Will need follow up with Dr. Shelle [name] 2 weeks post-op for staple removal  Newly dx DM, insulin resistant. Glucose 246 this AM despite starting metformin and Lantus. Hospitalists following, appreciate input. She will be able to follow up outpt with Dr. Loran SentersSteven [name] upon D/C. Will need Rx from hospitalists for diabetic meds for D/C.  Brianna [name] M. 09/05/2014, 7:48 AM

## 2014-09-05 NOTE — Discharge Summary (Signed)
Physician Discharge Summary   Patient ID: Brianna [name] MRN: [id number] DOB/AGE: 15-Jan-1991 24 y.o.  Admit date: 09/03/2014 Discharge date: 09/05/2014  Primary Diagnosis:   HNP L4-L5, L5-S1  Admission Diagnoses:  Past Medical History  Diagnosis Date  . Obesity   . Pain     back pain, dx. herniated nucleus polpusos   Discharge Diagnoses:   Principal Problem:   HNP (herniated nucleus pulposus), lumbar Active Problems:   Obesity   Diabetes mellitus type 2 in obese   HTN (hypertension)   Fever presenting with conditions classified elsewhere  Procedure:  Procedure(s) (LRB): MICROLUMBAR DECOMPRESSION LUMBAR FOUR TO FIVE, LUMBAR FIVE TO SACRAL ONE (N/A)   Consults: hospitalists for new onset DM mgmt  HPI:  see H&P    Laboratory Data: No results found for any previous visit.  Recent Labs  09/04/14 0516 09/05/14 0550  HGB 10.6* 10.5*    Recent Labs  09/04/14 0516 09/05/14 0550  WBC 7.2 9.3  RBC 3.84* 3.74*  HCT 33.7* 33.1*  PLT 201 208    Recent Labs  09/04/14 0516 09/05/14 0550  NA 132* 133*  K 4.2 3.5  CL 99 98  CO2 26 26  BUN 9 8  CREATININE 0.46* 0.41*  GLUCOSE 249* 246*  CALCIUM 8.3* 8.5   No results for input(s): LABPT, INR in the last 72 hours.  X-Rays:Dg Lumbar Spine 2-3 Views  08/27/2014   CLINICAL DATA:  Preoperative exam prior to lumbar HNP repair  EXAM: LUMBAR SPINE - 2-3 VIEW  COMPARISON:  None.  FINDINGS: The lumbar vertebral bodies are preserved in height. The intervertebral disc space heights are reasonably well maintained. There is no pars defect or spondylolisthesis. The pedicles and transverse processes are intact. There is mild facet joint hypertrophy at L4-5 and at L5-S1. The observed portions of the sacrum are normal.  IMPRESSION: There is no acute bony abnormality of the lumbar spine.   Electronically Signed   By: David  Martinique   On: 08/27/2014 13:56   Dg Chest Port 1 View  09/04/2014   CLINICAL DATA:  Cough and chest  congestion.  EXAM: PORTABLE CHEST - 1 VIEW  COMPARISON:  None.  FINDINGS: The heart size and mediastinal contours are within normal limits. Both lungs are clear. The visualized skeletal structures are unremarkable.  IMPRESSION: No active disease.   Electronically Signed   By: Earle Gell M.D.   On: 09/04/2014 09:15   Dg Spine Portable 1 View  09/03/2014   CLINICAL DATA:  Micro lumbar decompression L4-5 and L5-S1.  EXAM: PORTABLE SPINE - 1 VIEW  COMPARISON:  09/03/2014 at 1133 hours.  FINDINGS: A single intraoperative cross-table lateral view of the lumbar spine, taken at 1349 hours, is submitted. Numbering system utilized on the comparison examination is preserved. Surgical instrument tip projects along the posterior aspect of the L4-5 interspace.  IMPRESSION: L4-5 intraoperative localization.   Electronically Signed   By: Lorin Picket M.D.   On: 09/03/2014 14:12   Dg Spine Portable 1 View  09/03/2014   CLINICAL DATA:  Surgical level L4-5, L5-S1.  EXAM: PORTABLE SPINE - 1 VIEW  COMPARISON:  09/03/2014.  To 09/2014.  FINDINGS: Posterior surgical instruments are in place with the localized instrument at the L5-S1 level.  IMPRESSION: Intraoperative localization as above.   Electronically Signed   By: Rolm Baptise M.D.   On: 09/03/2014 12:48   Dg Spine Portable 1 View  09/03/2014   CLINICAL DATA:  24 year old female undergoing L4-L5 and L5-S1  posterior lumbar interbody fusion  EXAM: PORTABLE SPINE - 1 VIEW  COMPARISON:  Prior radiographs obtained earlier today at 10:56 a.m.  FINDINGS: Single cross-table lateral radiograph demonstrates soft tissue spreaders posterior to the L5-S1 level. Metallic probes are present at the L4-L5 apophyseal joint, and at the L5-S1 apophyseal joint.  IMPRESSION: Intraoperative localization radiographs as above.   Electronically Signed   By: Jacqulynn Cadet M.D.   On: 09/03/2014 12:08   Dg Spine Portable 1 View  09/03/2014   CLINICAL DATA:  Surgical level L4-5 and L5-S1.   EXAM: PORTABLE SPINE - 1 VIEW  COMPARISON:  08/27/2014  FINDINGS: Posterior needles are directed toward the S1 vertebral body and the L4-5 interspace.  IMPRESSION: Intraoperative localization as above.   Electronically Signed   By: Rolm Baptise M.D.   On: 09/03/2014 11:25    EKG:No orders found for this or any previous visit.   Hospital Course: Patient was admitted to [facility] and taken to the OR and underwent the above state procedure without complications.  Patient tolerated the procedure well and was later transferred to the recovery room and then to the orthopaedic floor for postoperative care.  They were given PO and IV analgesics for pain control following their surgery.  They were given 24 hours of postoperative antibiotics.   PT was consulted postop to assist with mobility and transfers.  The patient was allowed to be WBAT with therapy and was taught back precautions. Discharge planning was consulted to help with postop disposition and equipment needs.  Patient had a difficult night on the evening of surgery and started to get up OOB with therapy on day one. She was limited by pain POD #1 which was better controlled on day 2. She was seen in consultation by hospitalist service for newly dx DM, insulin resistant, A1C of 11.3, started on lantus and metformin. She will be followed outpt by new PCP Dr. Wolfgang Phoenix who also sees her mother. She was also placed on levaquin for FUO. Patient was seen in rounds and was ready to go home on day two.  They were given discharge instructions and dressing directions.  They were instructed on when to follow up in the office with Dr. Tonita Cong.   Diet: Diabetic diet Activity:WBAT; Lspine precautions Follow-up:in 14 days Disposition - Home Discharged Condition: good   Discharge Instructions    Call MD / Call 911    Complete by:  As directed   If you experience chest pain or shortness of breath, CALL 911 and be transported to the hospital emergency room.   If you develope a fever above 101 F, pus (white drainage) or increased drainage or redness at the wound, or calf pain, call your surgeon's office.     Constipation Prevention    Complete by:  As directed   Drink plenty of fluids.  Prune juice may be helpful.  You may use a stool softener, such as Colace (over the counter) 100 mg twice a day.  Use MiraLax (over the counter) for constipation as needed.     Diet - low sodium heart healthy    Complete by:  As directed      Increase activity slowly as tolerated    Complete by:  As directed             Medication List    STOP taking these medications        oxyCODONE-acetaminophen 5-325 MG per tablet  Commonly known as:  PERCOCET/ROXICET  Replaced  by:  oxyCODONE-acetaminophen 7.5-325 MG per tablet      TAKE these medications        docusate sodium 100 MG capsule  Commonly known as:  COLACE  Take 1 capsule (100 mg total) by mouth 2 (two) times daily as needed for mild constipation.     freestyle lancets  Use as instructed     gabapentin 300 MG capsule  Commonly known as:  NEURONTIN  Take 300 mg by mouth 3 (three) times daily.     glucose blood test strip  Commonly known as:  ACCU-CHEK ACTIVE STRIPS  Use as instructed     Insulin Glargine 100 UNIT/ML Solostar Pen  Commonly known as:  LANTUS SOLOSTAR  Inject 10 Units into the skin daily at 10 pm.     insulin starter kit- syringes Misc  1 kit by Other route once.     Insulin Syringes (Disposable) U-100 0.3 ML Misc  1 Syringe by Does not apply route 1 day or 1 dose.     levofloxacin 750 MG tablet  Commonly known as:  LEVAQUIN  Take 1 tablet (750 mg total) by mouth daily.     metFORMIN 500 MG tablet  Commonly known as:  GLUCOPHAGE  Take 1 tablet (500 mg total) by mouth 2 (two) times daily with a meal.     methocarbamol 500 MG tablet  Commonly known as:  ROBAXIN  Take 1 tablet (500 mg total) by mouth 3 (three) times daily.     naproxen sodium 220 MG tablet  Commonly  known as:  ANAPROX  Take 220 mg by mouth 2 (two) times daily as needed.     oxyCODONE-acetaminophen 7.5-325 MG per tablet  Commonly known as:  PERCOCET  Take 1 tablet by mouth every 4 (four) hours as needed for pain.           Follow-up Information    Follow up with BEANE,JEFFREY C, MD In 2 weeks.   Specialty:  Orthopedic Surgery   Why:  For suture removal   Contact information:   [address] [place] [address] [phone]       Follow up with Rubbie Battiest, MD.   Specialty:  Family Medicine   Why:  schedule an appt as soon as possible for diabetic management   Contact information:   [address] [place] [address] [phone]       Signed: Lacie Draft, PA-C Orthopaedic Surgery 09/05/2014, 1:58 PM

## 2014-09-09 ENCOUNTER — Ambulatory Visit: Payer: Self-pay | Admitting: Family Medicine

## 2014-09-22 ENCOUNTER — Ambulatory Visit (INDEPENDENT_AMBULATORY_CARE_PROVIDER_SITE_OTHER): Payer: PRIVATE HEALTH INSURANCE | Admitting: Family Medicine

## 2014-09-22 ENCOUNTER — Encounter: Payer: Self-pay | Admitting: Family Medicine

## 2014-09-22 VITALS — BP 132/86 | Ht 64.0 in | Wt 318.6 lb

## 2014-09-22 DIAGNOSIS — E1169 Type 2 diabetes mellitus with other specified complication: Secondary | ICD-10-CM

## 2014-09-22 DIAGNOSIS — I10 Essential (primary) hypertension: Secondary | ICD-10-CM

## 2014-09-22 DIAGNOSIS — E119 Type 2 diabetes mellitus without complications: Secondary | ICD-10-CM | POA: Diagnosis not present

## 2014-09-22 DIAGNOSIS — E669 Obesity, unspecified: Secondary | ICD-10-CM | POA: Diagnosis not present

## 2014-09-22 MED ORDER — GLIPIZIDE 5 MG PO TABS: 5.0000 mg | ORAL_TABLET | Freq: Two times a day (BID) | ORAL | Status: DC

## 2014-09-22 NOTE — Progress Notes (Signed)
   Subjective:    Patient ID: Brianna [name], female    DOB: 07/17/1991, 24 y.o.   MRN: [id number]  Diabetes She presents for her follow-up diabetic visit. She has type 2 diabetes mellitus. Risk factors for coronary artery disease include obesity, hypertension and diabetes mellitus. Current diabetic treatment includes insulin injections and oral agent (monotherapy). She is compliant with treatment all of the time.   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 Positive family history of diabetes. Next  History of elevated blood pressure. On medication the past.  Long-standing history of obesity  Review of Systems No chest pain no headache back pain improving no abdominal pain no change in bowel habits    Objective:   Physical Exam Alert morbid obesity apparent blood pressure 132 or 78 on repeat HEENT normal lungs clear heart rare rhythm ankles without edema       Assessment & Plan:  Impression #1 type 2 diabetes new onset in poor control, hopefully can get off insulin to 1. discussed #2 history hypertension stable #3 hyperlipidemia status uncertain #4 morbid obesity discussed plan add glipizide 5 twice a day call us in 2 weeks recheck in one month. A1c then. WSL

## 2014-10-06 ENCOUNTER — Telehealth: Payer: Self-pay | Admitting: Family Medicine

## 2014-10-06 NOTE — Telephone Encounter (Signed)
metFORMIN (GLUCOPHAGE) 500 MG tablet  pts mom states that this med was issued at the Hospital, she needs A refill an wants to know if you will refill it for her since you are now her PCP  [facility]

## 2014-10-08 ENCOUNTER — Other Ambulatory Visit: Payer: Self-pay

## 2014-10-08 MED ORDER — METFORMIN HCL 500 MG PO TABS: 500.0000 mg | ORAL_TABLET | Freq: Two times a day (BID) | ORAL | Status: DC

## 2014-10-15 ENCOUNTER — Telehealth: Payer: Self-pay | Admitting: Family Medicine

## 2014-10-15 NOTE — Telephone Encounter (Signed)
Pt states they need a referral to nutritionist with Dr Fransico HimNida to continue to see her

## 2014-10-15 NOTE — Telephone Encounter (Signed)
i've seen the pt only one, she missed her first appt with me, they did nt call with f u blood sugars as requested. Clarify if pt requesting gpoing to dr nida for diab management, if so, plz do. Due to be seen next wk-do they plan to come for that or not, if not, plz cancel

## 2014-10-16 ENCOUNTER — Telehealth: Payer: Self-pay | Admitting: Family Medicine

## 2014-10-16 ENCOUNTER — Telehealth: Payer: Self-pay

## 2014-10-16 DIAGNOSIS — E119 Type 2 diabetes mellitus without complications: Secondary | ICD-10-CM

## 2014-10-16 NOTE — Telephone Encounter (Signed)
Notified patient that referral was put in the system.

## 2014-10-16 NOTE — Telephone Encounter (Signed)
Patient wants a referral to the nutriontist at [facility] for her diabetes. Patient has appointment on 10/27/14.

## 2014-10-16 NOTE — Telephone Encounter (Signed)
Ok lets do 

## 2014-10-16 NOTE — Telephone Encounter (Signed)
Penny Crumpton, Certified Diabetes Educator, called to let us know that patient will not have to see a diabetes specialist in order to see her.  She said there was confusion about this earlier. °

## 2014-10-16 NOTE — Telephone Encounter (Signed)
Patient states that she does not want Dr. Fransico HimNida to manage her diabetes, she just wants to see his nutritionist. Advised patient that if she wants Dr. Brett CanalesSteve to continue to manage her diabetes then she needs to come in for her appointment and discuss a referral to the nutrionist because she is due for a follow up visit. Patient states that she will keep her appointment for April 4 with Dr. Brett CanalesSteve.

## 2014-10-20 ENCOUNTER — Ambulatory Visit: Payer: PRIVATE HEALTH INSURANCE | Admitting: Family Medicine

## 2014-10-22 LAB — HM DIABETES EYE EXAM

## 2014-10-23 ENCOUNTER — Encounter: Payer: Self-pay | Admitting: Nutrition

## 2014-10-23 ENCOUNTER — Encounter: Payer: PRIVATE HEALTH INSURANCE | Attending: Family Medicine | Admitting: Nutrition

## 2014-10-23 DIAGNOSIS — Z713 Dietary counseling and surveillance: Secondary | ICD-10-CM | POA: Diagnosis not present

## 2014-10-23 DIAGNOSIS — Z794 Long term (current) use of insulin: Secondary | ICD-10-CM | POA: Diagnosis not present

## 2014-10-23 DIAGNOSIS — Z6841 Body Mass Index (BMI) 40.0 and over, adult: Secondary | ICD-10-CM | POA: Insufficient documentation

## 2014-10-23 DIAGNOSIS — E669 Obesity, unspecified: Secondary | ICD-10-CM | POA: Diagnosis not present

## 2014-10-23 DIAGNOSIS — E119 Type 2 diabetes mellitus without complications: Secondary | ICD-10-CM | POA: Insufficient documentation

## 2014-10-23 NOTE — Progress Notes (Signed)
  Medical Nutrition Therapy:  Appt start time: 1100 end time:  1200.  Assessment:  Primary concerns today: Diabetes Type 2. Her mom is here with her today, who also has Type 2 DM.Brianna [name]. Lives with her mom. She just found out in February when she went to have back surgery that she was a diabetic. A1C was 11% or something she states.. Testing blood sugars once in am. FBS 160-200's. Feels thirsty a lot but is getting better now that she has been on insulin. Physical activity: PT twice a week. Had back surgery in 08/2014 and found out she had DM. She had 2 herniated discs.   She has cut out soda to only once a week. She admits to still eating a lot of junk food and needing to get rid of chips, fast foods and work on weight loss.    A1C 08/2014 was 11.3%. Lipid profile: LDL High 100 mg/dl.    Diet is inconsistent and excessive in CHO as evidenced by elevated A1C of 11%. Excessive calorie intake as evidenced by BMI of 55.     Not exercising at present but willing to start walking. Not working or going to school right now.  Preferred Learning Style:   Auditory  Visual  Hands on  Learning Readiness:  Ready  Change in progress  MEDICATIONS: See list.   DIETARY INTAKE:  24-hr recall:  B  11 am. ( AM): [place] and spinach omelet.8 oz milk 2%.  Snk ( AM): chips  L ( PM): skipped: Snk ( PM): sometimes chips or junk food D ( PM): Chicken, rice 1-2 cups, broccoli chinese, Water Snk ( PM):  Beverages: water,  Usual physical activity: PT.  Estimated energy needs: 1500 calories 170 g carbohydrates 112 g protein 42 g fat  Progress Towards Goal(s):  In progress.   Nutritional Diagnosis:  NB-1.1 Food and nutrition-related knowledge deficit As related to Diabetes.  As evidenced by 11.3%..    Intervention:  Nutrition and diabetes education on disease, CHO Counting, My Plate, portion sizes, low fat low sodium high fiber diet, target ranges for blood sugars and treatment of hyper/hypoglycemia and  signs/symptoms of both, complications of Dm and cardiovascular risk factors. Need for exercise for weight loss.  Goals: 1. Follow My Plate as discussed. 2. Eat three meals per day. 3. Avoid snacks between meals. 4. Increase fresh fruits and vegetables. 5. Drink only  water and cut out sodas, tea and juices. 6. Test blood sugars before meals and at bedtime. 7. Keep a food journal. 8. Get A1C down to 8% in three months. 9. Exercise 30 minutes daily. 10. Lose 1 lb per week til next visit.  Teaching Method Utilized:  Visual Auditory Hands on  Handouts given during visit include:  The Plate Method  Diabetes and You  Carb Counting  Barriers to learning/adherence to lifestyle change: None  Demonstrated degree of understanding via:  Teach Back   Monitoring/Evaluation:  Dietary intake, exercise, meal planning, SBG, and body weight in 1 month(s).

## 2014-10-24 LAB — HM DIABETES EYE EXAM

## 2014-10-27 ENCOUNTER — Encounter: Payer: Self-pay | Admitting: Nutrition

## 2014-10-27 ENCOUNTER — Encounter: Payer: Self-pay | Admitting: Family Medicine

## 2014-10-27 ENCOUNTER — Ambulatory Visit (INDEPENDENT_AMBULATORY_CARE_PROVIDER_SITE_OTHER): Payer: PRIVATE HEALTH INSURANCE | Admitting: Family Medicine

## 2014-10-27 VITALS — BP 138/90 | Ht 64.0 in | Wt 322.0 lb

## 2014-10-27 DIAGNOSIS — I10 Essential (primary) hypertension: Secondary | ICD-10-CM | POA: Diagnosis not present

## 2014-10-27 DIAGNOSIS — E1169 Type 2 diabetes mellitus with other specified complication: Secondary | ICD-10-CM

## 2014-10-27 DIAGNOSIS — E669 Obesity, unspecified: Secondary | ICD-10-CM

## 2014-10-27 DIAGNOSIS — E119 Type 2 diabetes mellitus without complications: Secondary | ICD-10-CM

## 2014-10-27 MED ORDER — GLIPIZIDE 5 MG PO TABS: 10.0000 mg | ORAL_TABLET | Freq: Two times a day (BID) | ORAL | Status: DC

## 2014-10-27 NOTE — Patient Instructions (Signed)
Goals: 1. Follow My Plate as discussed. 2. Eat three meals per day. 3. Avoid snacks between meals. 4. Increase fresh fruits and vegetables. 5. Drink only  water and cut out sodas, tea and juices. 6. Test blood sugars before meals and at bedtime. 7. Keep a food journal. 8. Get A1C down to 8% in three months. 9. Exercise 30 minutes daily. 10. Lose 1 lb per week til next visit.

## 2014-10-27 NOTE — Progress Notes (Signed)
   Subjective:    Patient ID: Brianna [name], female    DOB: 08/29/1990, 24 y.o.   MRN: [id number]  Diabetes She presents for her follow-up diabetic visit. She has type 2 diabetes mellitus. Current diabetic treatment includes insulin injections and oral agent (dual therapy). She is compliant with treatment all of the time. She is currently taking insulin at bedtime. She is following a diabetic diet. Exercise: physical therapy twice weekly, walking on treadmill. Her breakfast blood glucose range is generally 180-200 mg/dl. She does not see a podiatrist.Eye exam is current.  taking glipizide 5mg  one bid, metformin 500 mg one bid, and lantus 10 units qhs.  a1c on 09/03/14 11.3.   160 to 200  Good diet improved.  Sticking with meds faithfully  No low sugar spells   Thirty min per day of walking on the treamill  Hoping to recover fr surg by first wk in may   Please see prior multiple phone messages since last interaction.   Review of Systems No headache no chest pain no back pain no abdominal pain no change in bowel habits    Objective:   Physical Exam Alert no acute distress vital stable HET normal lungs clear heart rare rhythm ankles without edema Blood pressure on repeat 130/78      Assessment & Plan:  Impression 1 type 2 diabetes control improving multiple questions answered #2 hypertension good control plan recheck in 3 months. Diet discussed exercise discussed. Increase glipizide to 2 tablets twice a day. Most fasting sugars greater than25 minutes spent most in discussion WSL

## 2014-11-13 ENCOUNTER — Encounter: Payer: Self-pay | Admitting: *Deleted

## 2014-11-17 ENCOUNTER — Telehealth: Payer: Self-pay | Admitting: Family Medicine

## 2014-11-17 NOTE — Telephone Encounter (Signed)
Pt is needing a refill on her diabetic needles. BD ULTRA-FINE PEN NEEDLES NANO [id number] [facility] [place] [address].

## 2014-11-17 NOTE — Telephone Encounter (Signed)
Script faxed pt notified

## 2014-12-11 ENCOUNTER — Encounter: Payer: PRIVATE HEALTH INSURANCE | Admitting: Nutrition

## 2015-01-20 ENCOUNTER — Ambulatory Visit: Payer: PRIVATE HEALTH INSURANCE | Admitting: Family Medicine

## 2015-02-05 ENCOUNTER — Encounter: Payer: Self-pay | Admitting: Family Medicine

## 2015-02-05 ENCOUNTER — Ambulatory Visit (INDEPENDENT_AMBULATORY_CARE_PROVIDER_SITE_OTHER): Payer: PRIVATE HEALTH INSURANCE | Admitting: Family Medicine

## 2015-02-05 VITALS — BP 130/94 | Ht 64.0 in | Wt 322.0 lb

## 2015-02-05 DIAGNOSIS — H6092 Unspecified otitis externa, left ear: Secondary | ICD-10-CM

## 2015-02-05 DIAGNOSIS — E119 Type 2 diabetes mellitus without complications: Secondary | ICD-10-CM

## 2015-02-05 DIAGNOSIS — I10 Essential (primary) hypertension: Secondary | ICD-10-CM | POA: Diagnosis not present

## 2015-02-05 LAB — POCT GLYCOSYLATED HEMOGLOBIN (HGB A1C): Hemoglobin A1C: 7.4

## 2015-02-05 MED ORDER — ENALAPRIL MALEATE 5 MG PO TABS: 5.0000 mg | ORAL_TABLET | Freq: Every day | ORAL | Status: DC

## 2015-02-05 MED ORDER — METFORMIN HCL 500 MG PO TABS: 1000.0000 mg | ORAL_TABLET | Freq: Two times a day (BID) | ORAL | Status: DC

## 2015-02-05 MED ORDER — NEOMYCIN-POLYMYXIN-HC 3.5-10000-1 OT SOLN: OTIC | Status: DC

## 2015-02-05 NOTE — Progress Notes (Signed)
   Subjective:    Patient ID: Brianna [name], female    DOB: 10/30/1990, 24 y.o.   MRN: [id number]  Diabetes She presents for her follow-up diabetic visit. She has type 2 diabetes mellitus. There are no hypoglycemic associated symptoms. There are no diabetic associated symptoms. There are no hypoglycemic complications. There are no diabetic complications. There are no known risk factors for coronary artery disease. Current diabetic treatment includes oral agent (dual therapy). She is compliant with treatment all of the time.    Patient states that she has been having left ear pain that has been present for a few weeks now.   Results for orders placed or performed in visit on 02/05/15  POCT glycosylated hemoglobin (Hb A1C)  Result Value Ref Range   Hemoglobin A1C 7.4    Glu 120 to 140  Take s insulin just once or twice per wk, really would like to stop the insulin  Forty to fifty hours per wk [employer]  Exercise not doing. Working too hard. Not making time for exercise. Next  Minutes to poor dietary compliance also.  Pt has treadmill bikre ellipticlal , fa exercises    Not exe rcising regulaly   Left ear painful a few wks, tender, uses qtip or similar  Pt was on bp meds before, sig fam hx of high b p. Try to cut salt down.     Review of Systems No headache no chest pain and back pain no abdominal pain no change in bowel habits    Objective:   Physical Exam Alert vitals stable blood pressure 140/92 on repeat obesity present. HEENT normal. Lungs clear. Heart regular in rhythm. Ankles without edema.  Left external ear inflamed tender     Assessment & Plan:  Impression 1 type 2 diabetes control much improved. Patient strongly wants to get off insulin discussed at length #2 hypertension times initiate medicine recommended. #3 left external otitis discussed plan Cortisporin Otic suspension tear. Stop insulin since pretty much a stopped anyway. Increase metformin to 2  twice a day. Initiate enalapril 5 mg daily at bedtime. Side effects benefits discussed. Recheck in several months. WSL

## 2015-05-11 ENCOUNTER — Ambulatory Visit: Payer: PRIVATE HEALTH INSURANCE | Admitting: Family Medicine

## 2015-06-01 ENCOUNTER — Encounter: Payer: Self-pay | Admitting: Family Medicine

## 2015-06-01 ENCOUNTER — Ambulatory Visit (INDEPENDENT_AMBULATORY_CARE_PROVIDER_SITE_OTHER): Payer: PRIVATE HEALTH INSURANCE | Admitting: Family Medicine

## 2015-06-01 VITALS — BP 152/90 | Ht 64.0 in | Wt 323.0 lb

## 2015-06-01 DIAGNOSIS — Z23 Encounter for immunization: Secondary | ICD-10-CM

## 2015-06-01 DIAGNOSIS — E119 Type 2 diabetes mellitus without complications: Secondary | ICD-10-CM

## 2015-06-01 DIAGNOSIS — M79604 Pain in right leg: Secondary | ICD-10-CM | POA: Diagnosis not present

## 2015-06-01 DIAGNOSIS — I10 Essential (primary) hypertension: Secondary | ICD-10-CM | POA: Diagnosis not present

## 2015-06-01 LAB — POCT GLYCOSYLATED HEMOGLOBIN (HGB A1C): Hemoglobin A1C: 10

## 2015-06-01 MED ORDER — ENALAPRIL MALEATE 10 MG PO TABS: 10.0000 mg | ORAL_TABLET | Freq: Every day | ORAL | Status: DC

## 2015-06-01 MED ORDER — INSULIN GLARGINE 100 UNIT/ML SOLOSTAR PEN: 8.0000 [IU] | PEN_INJECTOR | Freq: Every day | SUBCUTANEOUS | Status: DC

## 2015-06-01 MED ORDER — CLINDAMYCIN PHOSPHATE 1 % EX SOLN: Freq: Two times a day (BID) | CUTANEOUS | Status: DC

## 2015-06-01 NOTE — Progress Notes (Signed)
   Subjective:    Patient ID: Brianna [name], female    DOB: 05/02/1991, 24 y.o.   MRN: [id number] Patient arrives office with several general distinct concerns next   Diabetes She presents for her follow-up diabetic visit. She has type 2 diabetes mellitus. No MedicAlert identification noted. She has not had a previous visit with a dietitian. She does not see a podiatrist.Eye exam is current (April 2016).   has noted missing some diabetes doses lately and also very poor diet next  Not exercising working 50-60 hours per week   Patient would like to discuss pain to right leg post back surgery. Sharp pain right leg. Generally when lifting something heavy. Very transient. No weakness.  Compliant blood pressure medicine. Does not miss a dose watching salt intake.  Hemoglobin A1c today is: 10.0   Review of Systems No headache no chest pain no new back pain no abdominal pain no change in bowel habits ROS otherwise negative    Objective:   Physical Exam Blood pressure still elevated on repeat  Alert vitals stable morbid obesity present HEENT normal lungs clear heart rare rhythm ankles without edema negative straight leg raise on right strength sensation reflexes intact     Assessment & Plan:  Impression 1 hypertension suboptimal #2 type 2 diabetes poor control with noncompliance #3 transient neuropathic pain but not to sciatica discussed common after surgery reassured no further testing plan increase enalapril rationale discussed resume insulin rationale discussed diet exercise discussed patient to work on compliance recheck in several months flu shot today WSL

## 2015-08-31 ENCOUNTER — Ambulatory Visit: Payer: PRIVATE HEALTH INSURANCE | Admitting: Family Medicine

## 2015-09-25 ENCOUNTER — Ambulatory Visit (INDEPENDENT_AMBULATORY_CARE_PROVIDER_SITE_OTHER): Payer: PRIVATE HEALTH INSURANCE | Admitting: Family Medicine

## 2015-09-25 ENCOUNTER — Encounter: Payer: Self-pay | Admitting: Family Medicine

## 2015-09-25 VITALS — BP 138/86 | Ht 66.0 in | Wt 318.0 lb

## 2015-09-25 DIAGNOSIS — E119 Type 2 diabetes mellitus without complications: Secondary | ICD-10-CM

## 2015-09-25 DIAGNOSIS — I10 Essential (primary) hypertension: Secondary | ICD-10-CM

## 2015-09-25 LAB — POCT GLYCOSYLATED HEMOGLOBIN (HGB A1C): HEMOGLOBIN A1C: 10.5

## 2015-09-25 MED ORDER — ENALAPRIL MALEATE 10 MG PO TABS: 10.0000 mg | ORAL_TABLET | Freq: Every day | ORAL | Status: DC

## 2015-09-25 MED ORDER — GLIPIZIDE 5 MG PO TABS: 10.0000 mg | ORAL_TABLET | Freq: Two times a day (BID) | ORAL | Status: DC

## 2015-09-25 MED ORDER — METFORMIN HCL 500 MG PO TABS: 1000.0000 mg | ORAL_TABLET | Freq: Two times a day (BID) | ORAL | Status: DC

## 2015-09-25 NOTE — Progress Notes (Signed)
   Subjective:    Patient ID: Brianna [name], female    DOB: 11/04/1990, 25 y.o.   MRN: [id number]  Diabetes She presents for her follow-up diabetic visit. She has type 2 diabetes mellitus. She is compliant with treatment some of the time. She is following a generally unhealthy diet. She never participates in exercise. Home blood sugar record trend: pt does not check blood sugar. She does not see a podiatrist.Eye exam is current.  A1C done today. 10.5 Pt states no concerns today.   Very unfortunately this patient states she has pretty much stopped taking all of her medications. Not just the insulin. Taking her diabetes tablets only twice per week. There is family history diabetes. Patient lives at home with her parents. Her mother has diabetes. Patient works full time. Next  She admits she takes her tablets only with frequent urination or frequent thirst when that occurs. Next  She also admits she is not checking her sugar at all.  Also states she has stopped exercising. Next  Have been using the insulin very rarely in recent months.  Review of Systems No headache no chest pain no back pain no abdominal pain no change in bowel habits no blood in stool ROS otherwise negative    Objective:   Physical Exam  Alert blood pressure initially elevated improved on repeat but still too high for patient's age. HEENT normal. Lungs clear. Heart regular in rhythm. Ankles trace edema feet pulses good sensation intact  This Results for orders placed or performed in visit on 09/25/15  POCT glycosylated hemoglobin (Hb A1C)  Result Value Ref Range   Hemoglobin A1C 10.5    T.      Assessment & Plan:   alert impression 1 type 2 diabetes control poor. Now with polyuria and polydipsia. Very concerning. Noncompliance is tremendous. A1c up to 10.5% not watching diet at all. Not exercising all. Infectious pre-much stopped all medicines. Patient claims no depression or desire to hurt herself. #2  hypertension suboptimal in control discussed plan 25 minutes spent most in discussion. Mostly reviewing all the bad things can happen with ongoing terrible control diabetes. Stop insulin this time. Strips prescribed. Start checking sugars daily. Resume glipizide Vasotec Glucotrol rationale discussed recheck in several months WSL

## 2015-10-26 LAB — HM DIABETES EYE EXAM

## 2015-10-28 LAB — HM DIABETES EYE EXAM

## 2015-12-28 ENCOUNTER — Ambulatory Visit: Payer: PRIVATE HEALTH INSURANCE | Admitting: Family Medicine

## 2016-01-04 IMAGING — CR DG LUMBAR SPINE 2-3V
2 series · 2 of 2 positions shown · non-contrast
Comparison: None.

CLINICAL DATA: Preoperative exam prior to lumbar HNP repair

EXAM:
LUMBAR SPINE - 2-3 VIEW

[w l-spine a.p. *]
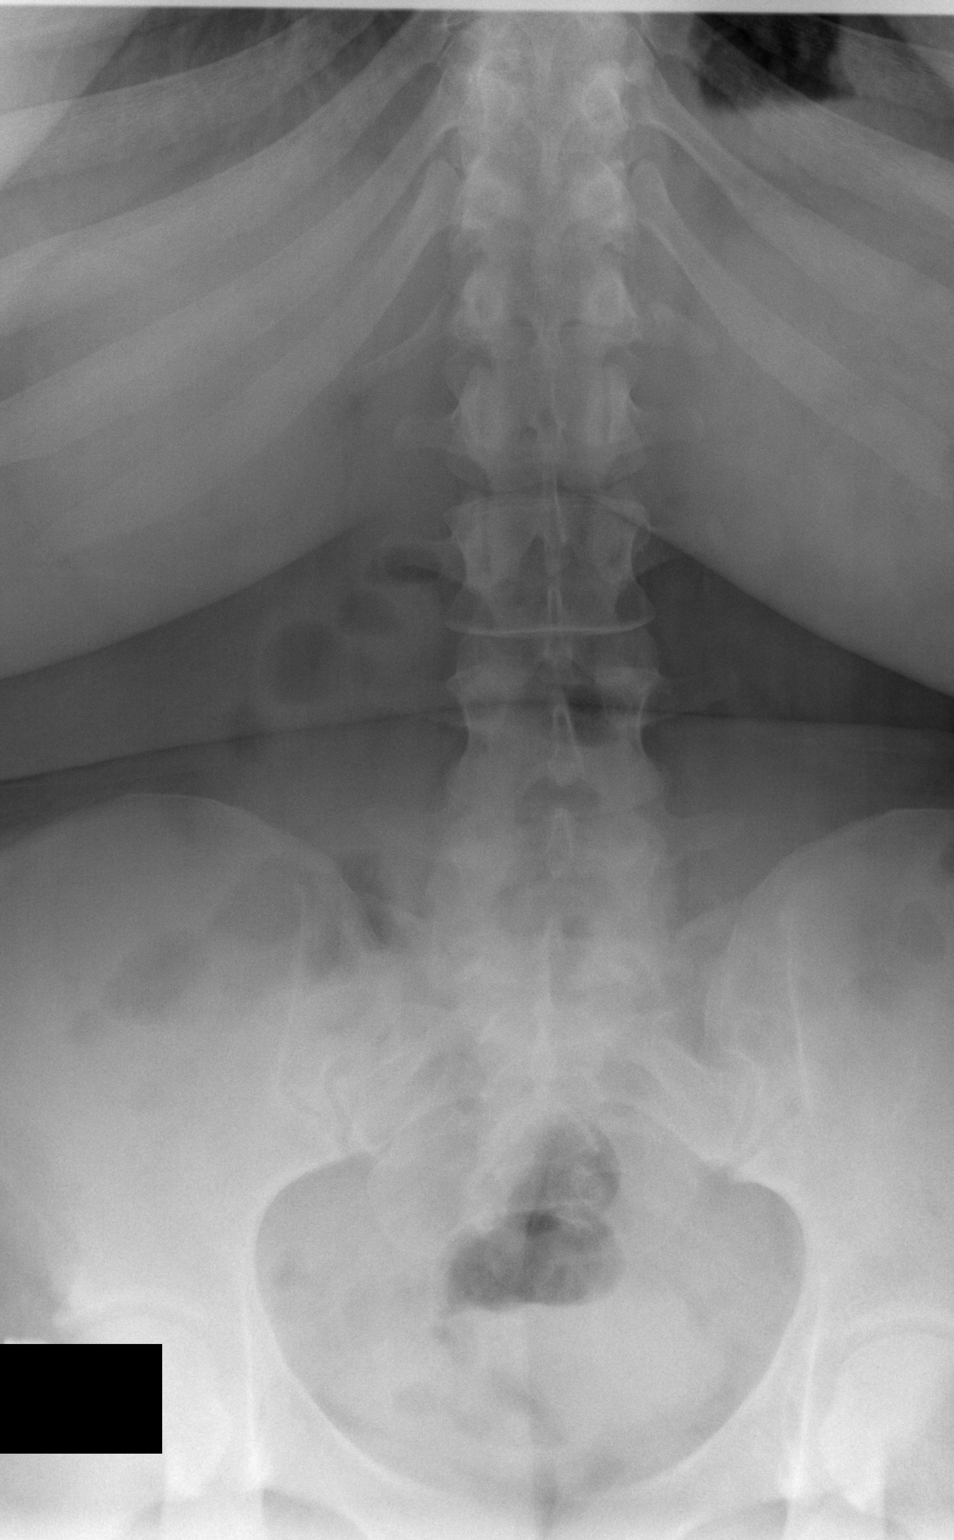

[w l-spine lat]
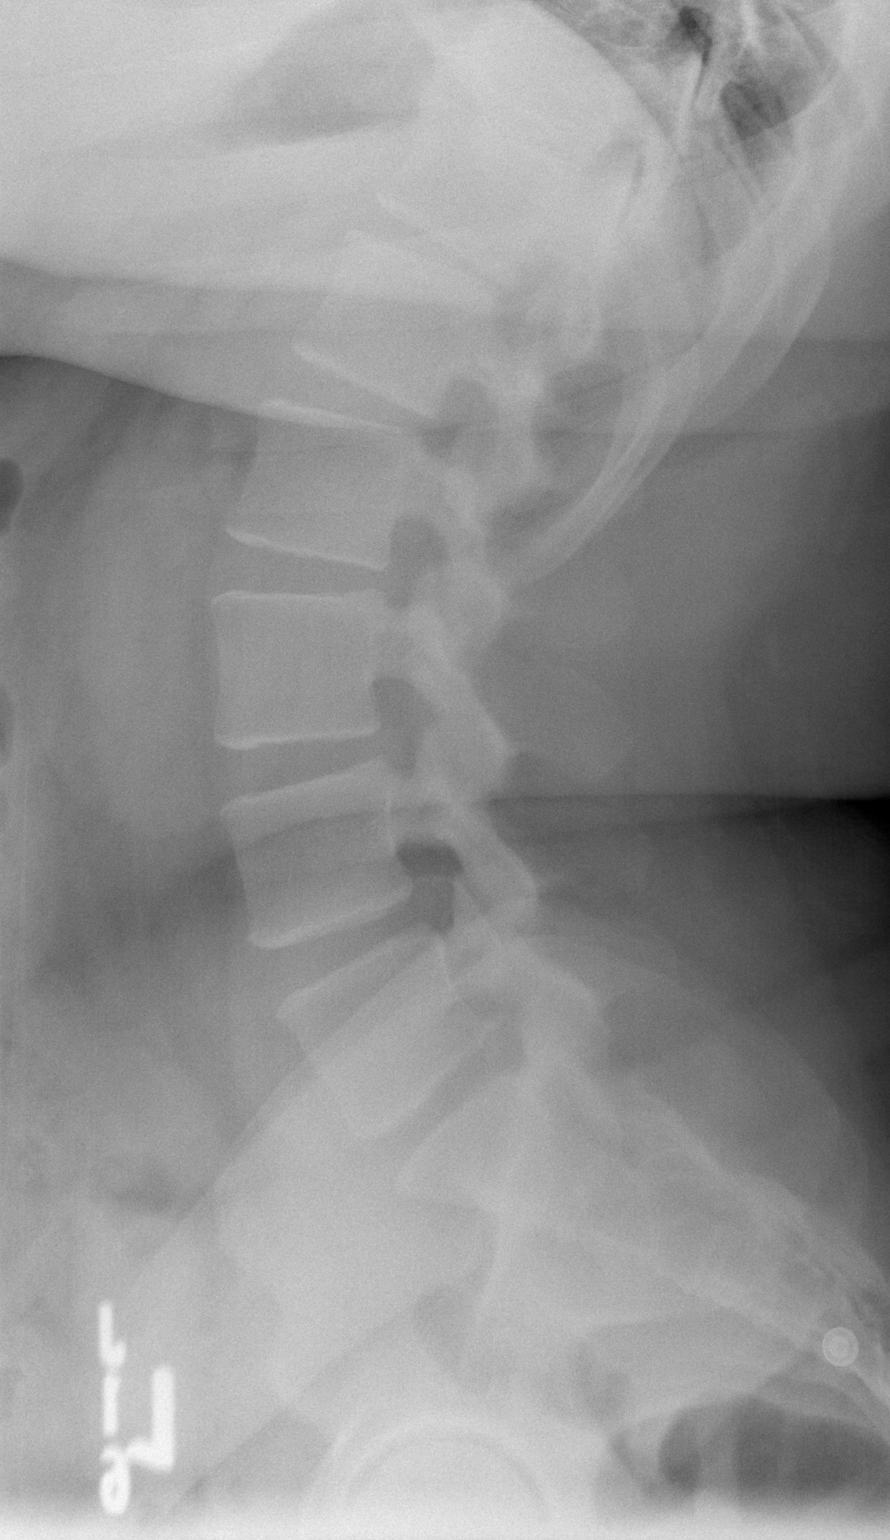

[2 of 2 positions shown; findings below may reference images not displayed]

FINDINGS: The lumbar vertebral bodies are preserved in height. The
intervertebral disc space heights are reasonably well maintained.
There is no pars defect or spondylolisthesis. The pedicles and
transverse processes are intact. There is mild facet joint
hypertrophy at L4-5 and at L5-S1. The observed portions of the
sacrum are normal.
IMPRESSION: There is no acute bony abnormality of the lumbar spine.

## 2016-01-11 IMAGING — DX DG SPINE 1V PORT
1 series · 1 of 1 positions shown · non-contrast
Comparison: 09/03/2014 at 3322 hours.

CLINICAL DATA: Micro lumbar decompression L4-5 and L5-S1.

EXAM:
PORTABLE SPINE - 1 VIEW

[l-spine x-table]
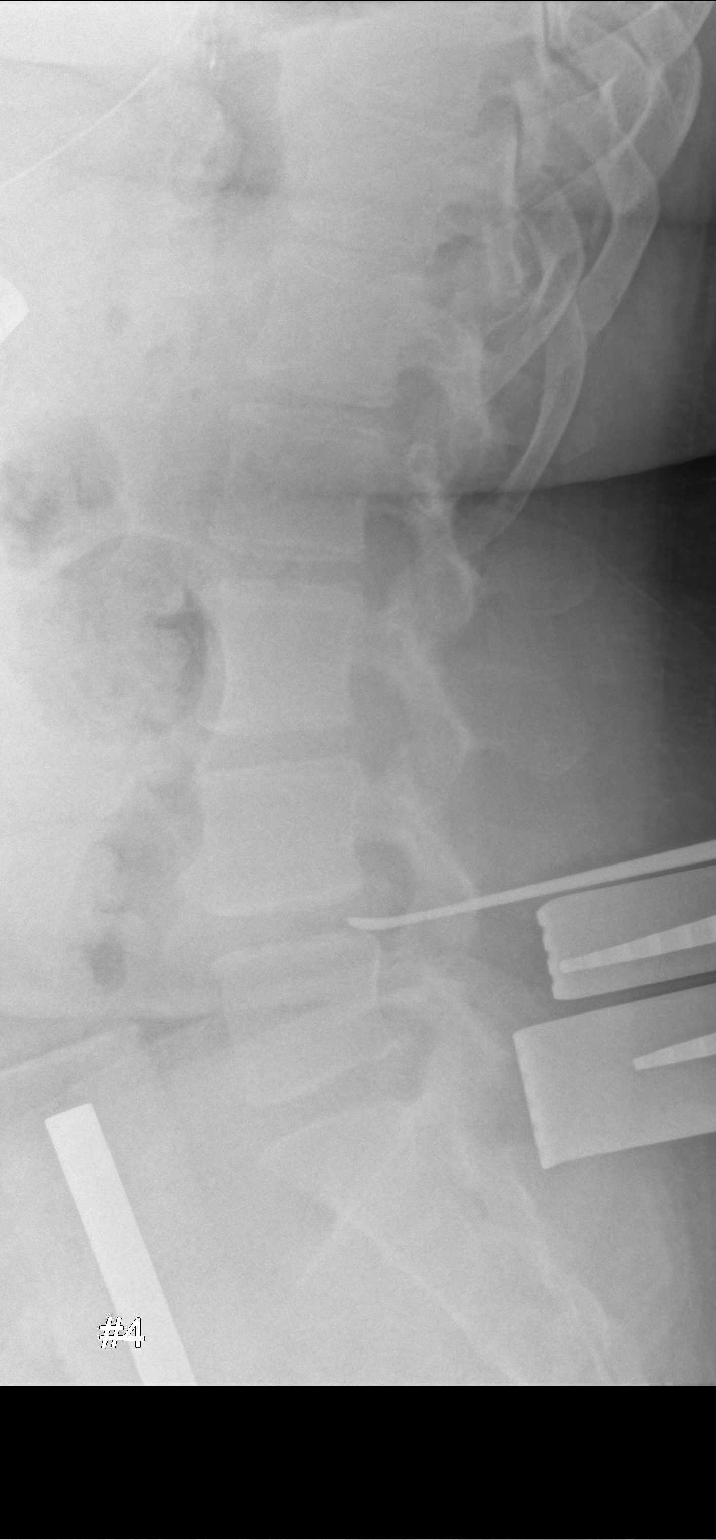

[1 of 1 positions shown; findings below may reference images not displayed]

FINDINGS: A single intraoperative cross-table lateral view of the lumbar
spine, taken at 6109 hours, is submitted. Numbering system utilized
on the comparison examination is preserved. Surgical instrument tip
projects along the posterior aspect of the L4-5 interspace.
IMPRESSION: L4-5 intraoperative localization.

## 2016-01-11 IMAGING — DX DG SPINE 1V PORT
1 series · 1 of 1 positions shown · non-contrast
Comparison: 08/27/2014

CLINICAL DATA: Surgical level L4-5 and L5-S1.

EXAM:
PORTABLE SPINE - 1 VIEW

[l-spine x-table]
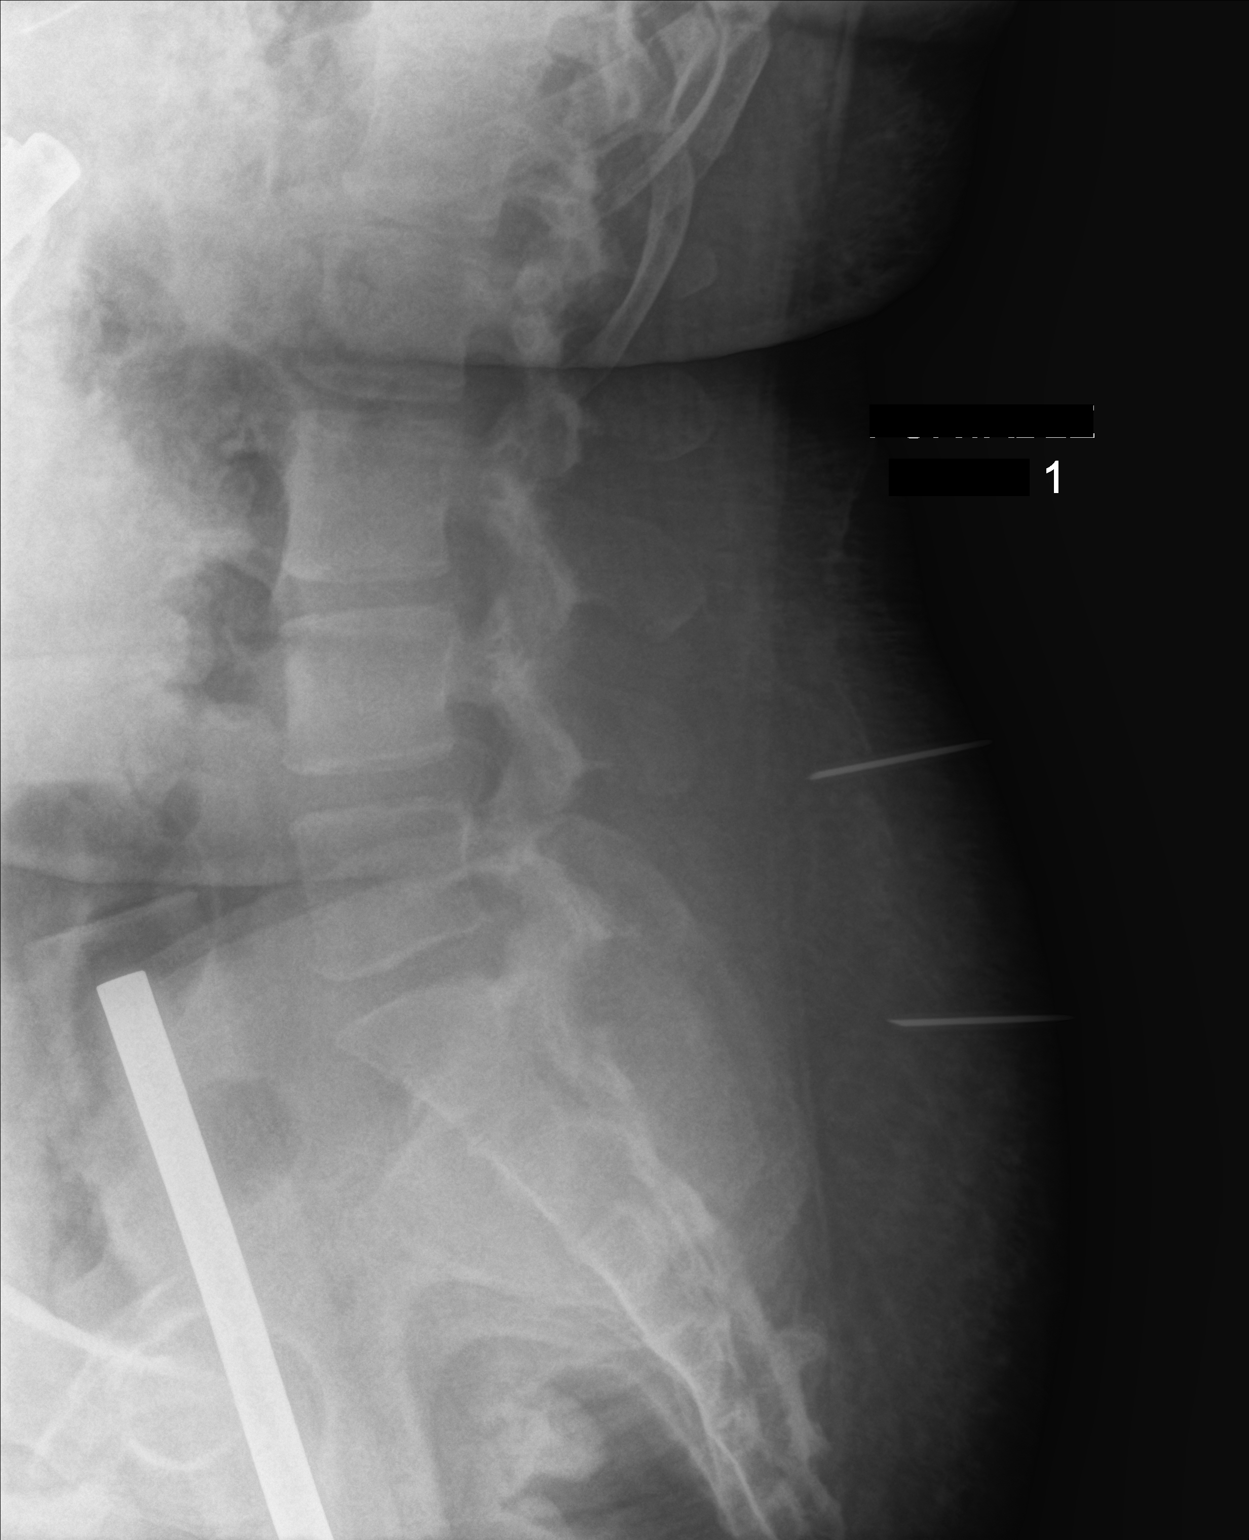

[1 of 1 positions shown; findings below may reference images not displayed]

FINDINGS: Posterior needles are directed toward the S1 vertebral body and the
L4-5 interspace.
IMPRESSION: Intraoperative localization as above.

## 2016-01-11 IMAGING — DX DG SPINE 1V PORT
1 series · 1 of 1 positions shown · non-contrast
Comparison: 09/03/2014.  To [DATE].

CLINICAL DATA: Surgical level L4-5, L5-S1.

EXAM:
PORTABLE SPINE - 1 VIEW

[l-spine x-table]
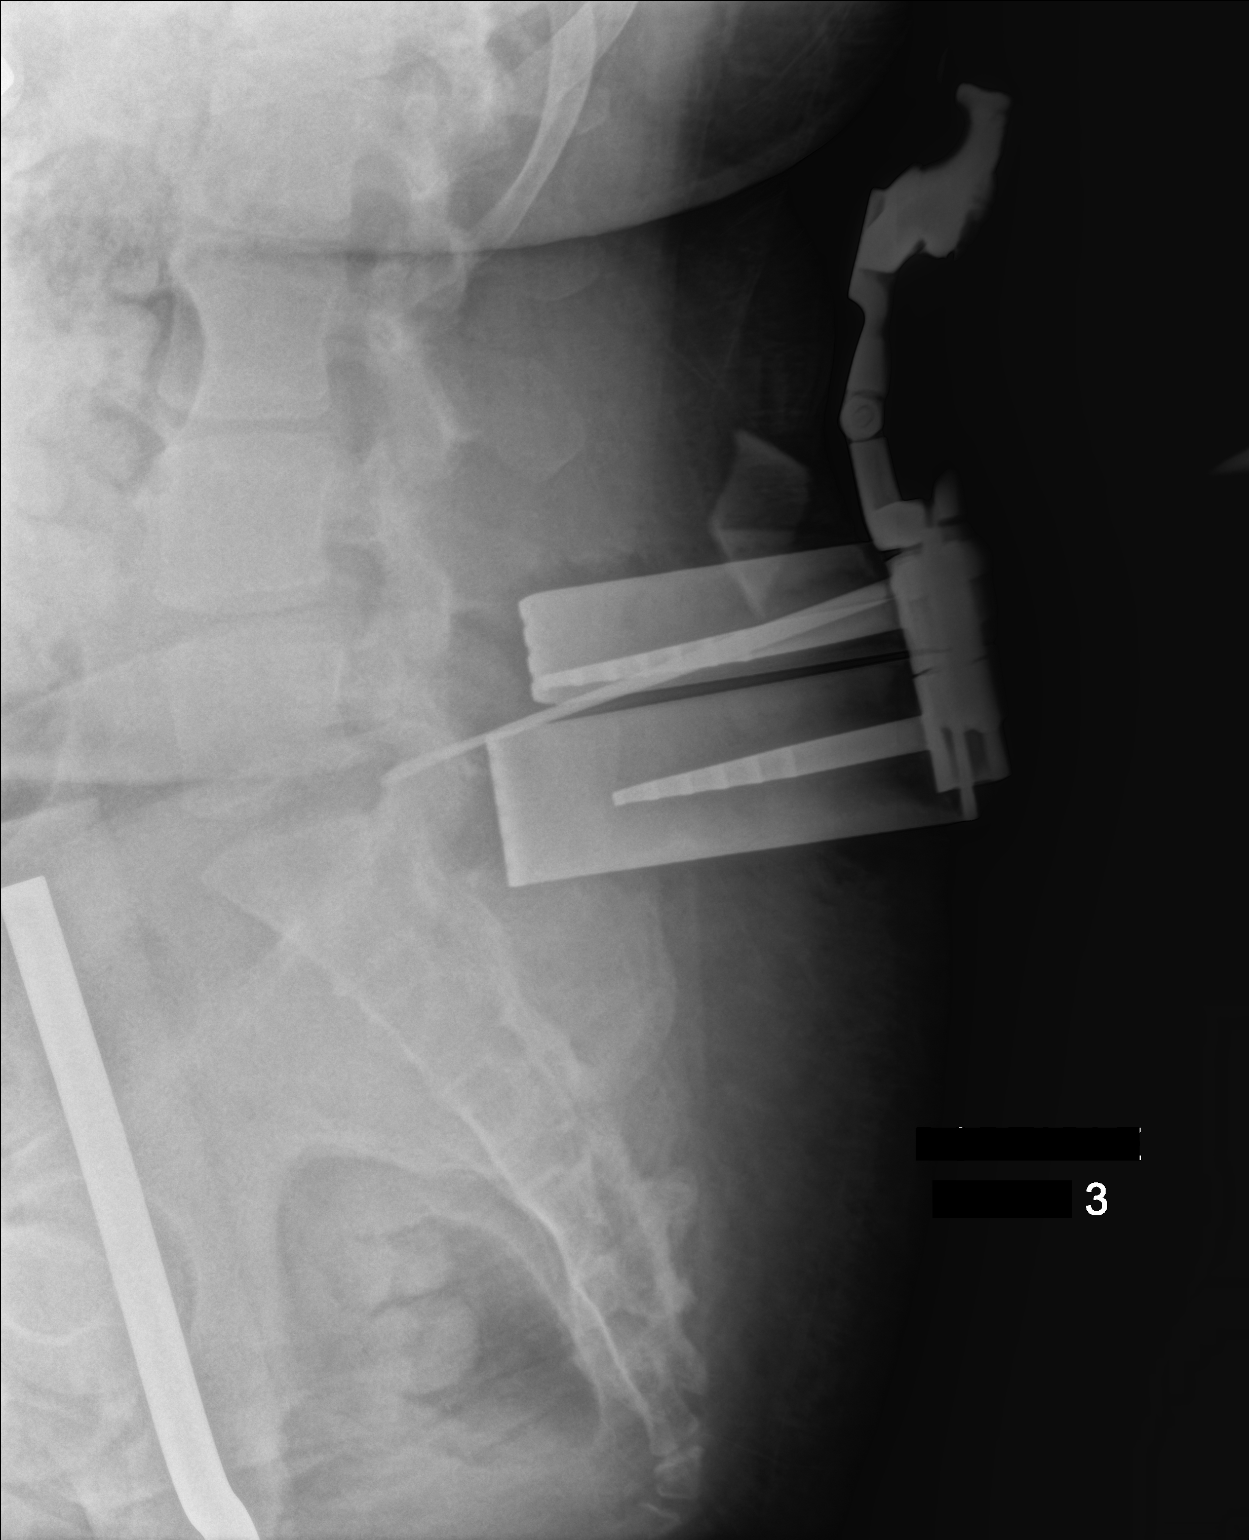

[1 of 1 positions shown; findings below may reference images not displayed]

FINDINGS: Posterior surgical instruments are in place with the localized
instrument at the L5-S1 level.
IMPRESSION: Intraoperative localization as above.

## 2016-01-11 IMAGING — DX DG SPINE 1V PORT
1 series · 1 of 1 positions shown · non-contrast
Comparison: Prior radiographs obtained earlier today at [DATE] a.m.

CLINICAL DATA: 24-year-old female undergoing L4-L5 and L5-S1
posterior lumbar interbody fusion

EXAM:
PORTABLE SPINE - 1 VIEW

[l-spine x-table]
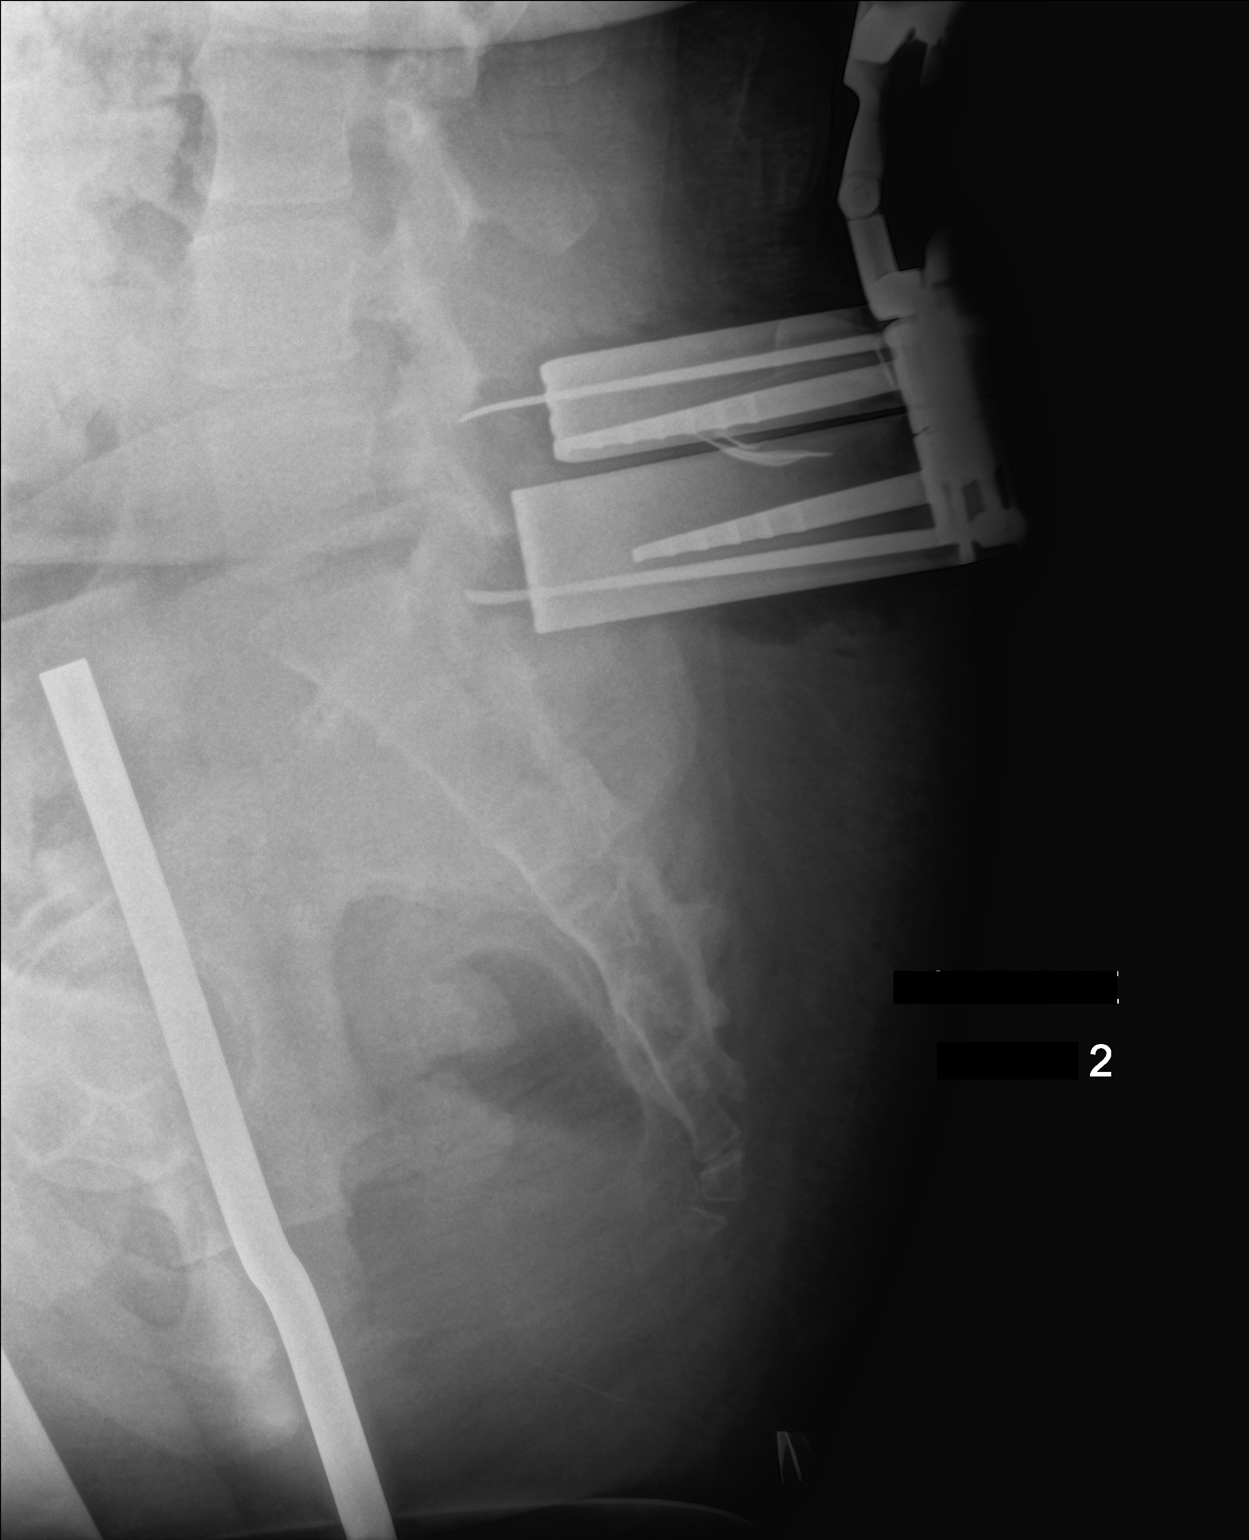

[1 of 1 positions shown; findings below may reference images not displayed]

FINDINGS: Single cross-table lateral radiograph demonstrates soft tissue
spreaders posterior to the L5-S1 level. Metallic probes are present
at the L4-L5 apophyseal joint, and at the L5-S1 apophyseal joint.
IMPRESSION: Intraoperative localization radiographs as above.

## 2016-01-28 ENCOUNTER — Ambulatory Visit (INDEPENDENT_AMBULATORY_CARE_PROVIDER_SITE_OTHER): Payer: PRIVATE HEALTH INSURANCE | Admitting: Family Medicine

## 2016-01-28 ENCOUNTER — Encounter: Payer: Self-pay | Admitting: Family Medicine

## 2016-01-28 VITALS — BP 136/88 | Ht 66.0 in | Wt 321.8 lb

## 2016-01-28 DIAGNOSIS — E119 Type 2 diabetes mellitus without complications: Secondary | ICD-10-CM | POA: Diagnosis not present

## 2016-01-28 DIAGNOSIS — Z79899 Other long term (current) drug therapy: Secondary | ICD-10-CM

## 2016-01-28 DIAGNOSIS — Z9119 Patient's noncompliance with other medical treatment and regimen: Secondary | ICD-10-CM

## 2016-01-28 DIAGNOSIS — I10 Essential (primary) hypertension: Secondary | ICD-10-CM | POA: Diagnosis not present

## 2016-01-28 DIAGNOSIS — Z13220 Encounter for screening for lipoid disorders: Secondary | ICD-10-CM

## 2016-01-28 DIAGNOSIS — Z91199 Patient's noncompliance with other medical treatment and regimen due to unspecified reason: Secondary | ICD-10-CM

## 2016-01-28 LAB — POCT GLYCOSYLATED HEMOGLOBIN (HGB A1C): Hemoglobin A1C: 10

## 2016-01-28 MED ORDER — METFORMIN HCL 500 MG PO TABS: 500.0000 mg | ORAL_TABLET | Freq: Two times a day (BID) | ORAL | Status: DC

## 2016-01-28 MED ORDER — ENALAPRIL MALEATE 10 MG PO TABS: 10.0000 mg | ORAL_TABLET | Freq: Every day | ORAL | Status: DC

## 2016-01-28 MED ORDER — GLUCOSE BLOOD VI STRP: ORAL_STRIP | Status: DC

## 2016-01-28 MED ORDER — GLIPIZIDE 5 MG PO TABS: 10.0000 mg | ORAL_TABLET | Freq: Two times a day (BID) | ORAL | Status: DC

## 2016-01-28 NOTE — Progress Notes (Signed)
   Subjective:    Patient ID: Brianna [name], female    DOB: 04/21/1991, [age] y.o.   MRN: [id number]  Diabetes She presents for her follow-up diabetic visit. She has type 2 diabetes mellitus. Risk factors for coronary artery disease include diabetes mellitus, obesity and hypertension. Current diabetic treatment includes oral agent (dual therapy). She is compliant with treatment all of the time. Her weight is stable. She is following a diabetic diet. She has not had a previous visit with a dietitian. She does not see a podiatrist.Eye exam is current.   Results for orders placed or performed in visit on 01/28/16  POCT glycosylated hemoglobin (Hb A1C)  Result Value Ref Range   Hemoglobin A1C 10   HM DIABETES EYE EXAM  Result Value Ref Range   HM Diabetic Eye Exam No Retinopathy No Retinopathy   Blood pressure medicine and blood pressure levels reviewed today with patient. Compliant with blood pressure medicine. States does not miss a dose. No obvious side effects. Blood pressure generally good when checked elsewhere. Watching salt intake.  On further history often is not taking her evening diabetes medicine. Just seems to forget it. Also unfortunately not checking her sugar in the morning. In addition after taking the metformin at the full dose feels nauseated and queasy. Sometimes with looser stools. Next  No low sugar spell Not checking the sugars   Review of Systems No headache, no major weight loss or weight gain, no chest pain no back pain abdominal pain no change in bowel habits complete ROS otherwise negative     Objective:   Physical Exam  Alert vital stable obesity present. HEENT normal lungs clear. Heart rare rhythm ankles without edema      Assessment & Plan:  Impression 1 type 2 diabetes poor control. Very poor compliance. Very worrisome for this patient. Extremely long discussion held about the importance of her to step up now that she is a young a tall intake care of her  cell. #2 hypertension good control plan appropriate blood work. Diet exercise discussed. Cut metformin down. Start checking sugars. Maintain compliance with meds. Follow-up in several months. No substantial improvement will initiate insulin discussed maintain same blood pressure medicine

## 2016-02-01 ENCOUNTER — Other Ambulatory Visit: Payer: Self-pay | Admitting: *Deleted

## 2016-02-01 MED ORDER — BLOOD GLUCOSE MONITOR KIT: PACK | Status: DC

## 2016-04-29 ENCOUNTER — Ambulatory Visit: Payer: PRIVATE HEALTH INSURANCE | Admitting: Family Medicine

## 2016-05-30 ENCOUNTER — Telehealth: Payer: Self-pay | Admitting: Family Medicine

## 2016-05-30 NOTE — Telephone Encounter (Signed)
Pt is currently uninsured and is needing her enalapril (VASOTEC) 10 MG tablet Changed to a medication on [facility] 4 dollar list.   LIST OF APPROVED MEDICATIONS WILL BE IN BLUE FOLDER IN OFFICE.    [facility] [place]

## 2016-06-02 MED ORDER — BENAZEPRIL HCL 10 MG PO TABS: 10.0000 mg | ORAL_TABLET | Freq: Every day | ORAL | 5 refills | Status: DC

## 2016-08-30 ENCOUNTER — Ambulatory Visit: Payer: PRIVATE HEALTH INSURANCE | Admitting: Family Medicine

## 2016-09-19 ENCOUNTER — Encounter: Payer: Self-pay | Admitting: Family Medicine

## 2016-09-19 ENCOUNTER — Ambulatory Visit (INDEPENDENT_AMBULATORY_CARE_PROVIDER_SITE_OTHER): Payer: BLUE CROSS/BLUE SHIELD | Admitting: Family Medicine

## 2016-09-19 VITALS — BP 122/80 | Ht 66.0 in | Wt 319.1 lb

## 2016-09-19 DIAGNOSIS — E119 Type 2 diabetes mellitus without complications: Secondary | ICD-10-CM

## 2016-09-19 DIAGNOSIS — Z79899 Other long term (current) drug therapy: Secondary | ICD-10-CM

## 2016-09-19 LAB — POCT GLYCOSYLATED HEMOGLOBIN (HGB A1C): Hemoglobin A1C: 8.2

## 2016-09-19 MED ORDER — GLIPIZIDE 5 MG PO TABS: 10.0000 mg | ORAL_TABLET | Freq: Two times a day (BID) | ORAL | 5 refills | Status: DC

## 2016-09-19 MED ORDER — METFORMIN HCL 500 MG PO TABS: 500.0000 mg | ORAL_TABLET | Freq: Two times a day (BID) | ORAL | 5 refills | Status: DC

## 2016-09-19 NOTE — Progress Notes (Signed)
   Subjective:    Patient ID: Brianna [name], female    DOB: 07/11/1991, 26 y.o.   MRN: [id number]  Diabetes  She presents for her follow-up diabetic visit. She has type 2 diabetes mellitus. No MedicAlert identification noted. She has not had a previous visit with a dietitian. She does not see a podiatrist.Eye exam is current.   Results for orders placed or performed in visit on 01/28/16  POCT glycosylated hemoglobin (Hb A1C)  Result Value Ref Range   Hemoglobin A1C 10   HM DIABETES EYE EXAM  Result Value Ref Range   HM Diabetic Eye Exam No Retinopathy No Retinopathy   not checking sugar  Trying to watch her diet  Works at [employer]    Six yr in march A1C  Patient claims compliance with diabetes medication. No obvious side effects. Reports no substantial low sugar spells. Most numbers are generally in good range when checked fasting. Generally does not miss a dose of medication. Watching diabetic diet closely  Blood pressure medicine and blood pressure levels reviewed today with patient. Compliant with blood pressure medicine. States does not miss a dose. No obvious side effects. Blood pressure generally good when checked elsewhere. Watching salt intake.  Patient continues to gain access weight. Admits to dietary noncompliance.  There is a Psychologist, occupationalcoach thru [employer]   Taking meds every day  8.2 per cent  9[Patient states no concerns this visit.  Review of Systems No headache, no major weight loss or weight gain, no chest pain no back pain abdominal pain no change in bowel habits complete ROS otherwise negative     Objective:   Physical Exam Alert vitals stable, NAD. Blood pressure good on repeat. HEENT normal. Lungs clear. Heart regular rate and rhythm. C diabetic foot exam      Assessment & Plan:  Impression type 2 diabetes discussed control still suboptimal though overall improved compared to before #2 hypertension good control discussed maintain same meds #3  morbid obesity discussed with high need to lose weight plan patient strongly encouraged to get blood work maintain same dose medications. Diet exercise discussed. Check in 6 months WSL

## 2016-09-20 LAB — LIPID PANEL
CHOL/HDL RATIO: 4.4 ratio (ref 0.0–4.4)
Cholesterol, Total: 203 mg/dL — ABNORMAL HIGH (ref 100–199)
HDL: 46 mg/dL (ref >39)
LDL CALC: 143 mg/dL — AB (ref 0–99)
Triglycerides: 68 mg/dL (ref 0–149)
VLDL CHOLESTEROL CAL: 14 mg/dL (ref 5–40)

## 2016-09-20 LAB — HEPATIC FUNCTION PANEL
ALBUMIN: 4.4 g/dL (ref 3.5–5.5)
ALT: 17 IU/L (ref 0–32)
AST: 11 IU/L (ref 0–40)
Alkaline Phosphatase: 61 IU/L (ref 39–117)
Bilirubin Total: 0.3 mg/dL (ref 0.0–1.2)
Bilirubin, Direct: 0.12 mg/dL (ref 0.00–0.40)
Total Protein: 7.6 g/dL (ref 6.0–8.5)

## 2016-09-20 LAB — BASIC METABOLIC PANEL
BUN/Creatinine Ratio: 20 (ref 9–23)
BUN: 10 mg/dL (ref 6–20)
CALCIUM: 9.5 mg/dL (ref 8.7–10.2)
CO2: 23 mmol/L (ref 18–29)
CREATININE: 0.49 mg/dL — AB (ref 0.57–1.00)
Chloride: 97 mmol/L (ref 96–106)
GFR calc Af Amer: 157 mL/min/{1.73_m2} (ref >59)
GFR, EST NON AFRICAN AMERICAN: 136 mL/min/{1.73_m2} (ref >59)
GLUCOSE: 218 mg/dL — AB (ref 65–99)
Potassium: 4.4 mmol/L (ref 3.5–5.2)
SODIUM: 136 mmol/L (ref 134–144)

## 2016-09-20 LAB — MICROALBUMIN / CREATININE URINE RATIO
CREATININE, UR: 69.1 mg/dL
MICROALB/CREAT RATIO: 27.2 mg/g{creat} (ref 0.0–30.0)
MICROALBUM., U, RANDOM: 18.8 ug/mL

## 2016-09-25 ENCOUNTER — Encounter: Payer: Self-pay | Admitting: Family Medicine

## 2016-10-17 ENCOUNTER — Encounter: Payer: Self-pay | Admitting: Family Medicine

## 2016-10-18 ENCOUNTER — Other Ambulatory Visit: Payer: Self-pay | Admitting: *Deleted

## 2016-10-18 MED ORDER — METFORMIN HCL 500 MG PO TABS: 500.0000 mg | ORAL_TABLET | Freq: Two times a day (BID) | ORAL | 1 refills | Status: DC

## 2016-10-18 MED ORDER — GLIPIZIDE 5 MG PO TABS: 10.0000 mg | ORAL_TABLET | Freq: Two times a day (BID) | ORAL | 1 refills | Status: DC

## 2016-10-18 MED ORDER — ENALAPRIL MALEATE 10 MG PO TABS: 10.0000 mg | ORAL_TABLET | Freq: Every day | ORAL | 1 refills | Status: DC

## 2017-02-15 LAB — HM DIABETES EYE EXAM

## 2017-03-20 ENCOUNTER — Ambulatory Visit: Payer: BLUE CROSS/BLUE SHIELD | Admitting: Family Medicine

## 2017-04-15 ENCOUNTER — Other Ambulatory Visit: Payer: Self-pay | Admitting: Family Medicine

## 2017-04-18 ENCOUNTER — Ambulatory Visit (INDEPENDENT_AMBULATORY_CARE_PROVIDER_SITE_OTHER): Payer: BLUE CROSS/BLUE SHIELD | Admitting: Family Medicine

## 2017-04-18 ENCOUNTER — Encounter: Payer: Self-pay | Admitting: Family Medicine

## 2017-04-18 VITALS — BP 132/90 | Ht 66.0 in | Wt 319.0 lb

## 2017-04-18 DIAGNOSIS — I10 Essential (primary) hypertension: Secondary | ICD-10-CM

## 2017-04-18 DIAGNOSIS — E785 Hyperlipidemia, unspecified: Secondary | ICD-10-CM

## 2017-04-18 DIAGNOSIS — E1169 Type 2 diabetes mellitus with other specified complication: Secondary | ICD-10-CM | POA: Diagnosis not present

## 2017-04-18 DIAGNOSIS — Z23 Encounter for immunization: Secondary | ICD-10-CM

## 2017-04-18 DIAGNOSIS — E119 Type 2 diabetes mellitus without complications: Secondary | ICD-10-CM | POA: Diagnosis not present

## 2017-04-18 DIAGNOSIS — Z6841 Body Mass Index (BMI) 40.0 and over, adult: Secondary | ICD-10-CM

## 2017-04-18 LAB — POCT GLYCOSYLATED HEMOGLOBIN (HGB A1C): HEMOGLOBIN A1C: 8.4

## 2017-04-18 MED ORDER — FREESTYLE LANCETS MISC: 5 refills | Status: DC

## 2017-04-18 MED ORDER — BLOOD GLUCOSE MONITOR KIT: PACK | 5 refills | Status: DC

## 2017-04-18 MED ORDER — GLIPIZIDE 5 MG PO TABS: ORAL_TABLET | ORAL | 1 refills | Status: DC

## 2017-04-18 MED ORDER — ENALAPRIL MALEATE 10 MG PO TABS: 10.0000 mg | ORAL_TABLET | Freq: Every day | ORAL | 1 refills | Status: DC

## 2017-04-18 MED ORDER — METFORMIN HCL 500 MG PO TABS: 500.0000 mg | ORAL_TABLET | Freq: Two times a day (BID) | ORAL | 1 refills | Status: DC

## 2017-04-18 NOTE — Progress Notes (Signed)
   Subjective:    Patient ID: Brianna [name], female    DOB: 26-Sep-1990, 26 y.o.   MRN: [id number]  Diabetes  She presents for her follow-up diabetic visit. She has type 2 diabetes mellitus. Risk factors for coronary artery disease include diabetes mellitus and obesity. Current diabetic treatment includes oral agent (dual therapy). She is compliant with treatment all of the time. Her weight is stable. She is following a diabetic diet. She does not see a podiatrist.Eye exam is current (july 2018).   Results for orders placed or performed in visit on 04/18/17  POCT glycosylated hemoglobin (Hb A1C)  Result Value Ref Range   Hemoglobin A1C 8.4   HM DIABETES EYE EXAM  Result Value Ref Range   HM Diabetic Eye Exam No Retinopathy No Retinopathy   Flu vaccine given today.   Blood pressure medicine and blood pressure levels reviewed today with patient. Compliant with blood pressure medicine. States does not miss a dose. No obvious side effects. Blood pressure generally good when checked elsewhere. Watching salt intake.  Patient continues to take lipid medication regularly. No obvious side effects from it. Generally does not miss a dose. Prior blood work results are reviewed with patient. Patient continues to work on fat intake in diet  Glu in the morn Review of Systems No headache, no major weight loss or weight gain, no chest pain no back pain abdominal pain no change in bowel habits complete ROS otherwise negative     Objective:   Physical Exam  Alert and oriented, vitals reviewed and stable, NAD ENT-TM's and ext canals WNL bilat via otoscopic exam Soft palate, tonsils and post pharynx WNL via oropharyngeal exam Neck-symmetric, no masses; thyroid nonpalpable and nontender Pulmonary-no tachypnea or accessory muscle use; Clear without wheezes via auscultation Card--no abnrml murmurs, rhythm reg and rate WNL Carotid pulses symmetric, without bruits       Assessment & Plan:  Impression  1 type 2 diabetes A1c not good discussed at length. Compliance very poor often not taking her medication.  #2hypertension. Discussed.  Control maintain same meds  #3 hyperlipidemia. Challenge this. Encouraged to cut down fats in her diet/LDL continues remain elevated will need meds  #4 morbid obesity. Not exercising long discussion held patient to work on Flu shot today Number  Greater than 50% of this 25 minute face to face visit was spent in counseling and discussion and coordination of care regarding the above diagnosis/diagnosies

## 2017-04-19 DIAGNOSIS — E785 Hyperlipidemia, unspecified: Secondary | ICD-10-CM

## 2017-04-19 DIAGNOSIS — E1169 Type 2 diabetes mellitus with other specified complication: Secondary | ICD-10-CM | POA: Insufficient documentation

## 2017-10-16 ENCOUNTER — Ambulatory Visit: Payer: BLUE CROSS/BLUE SHIELD | Admitting: Family Medicine

## 2017-10-16 ENCOUNTER — Encounter: Payer: Self-pay | Admitting: Family Medicine

## 2017-10-16 VITALS — BP 132/84 | Ht 66.0 in | Wt 324.2 lb

## 2017-10-16 DIAGNOSIS — E119 Type 2 diabetes mellitus without complications: Secondary | ICD-10-CM | POA: Diagnosis not present

## 2017-10-16 LAB — POCT GLYCOSYLATED HEMOGLOBIN (HGB A1C): Hemoglobin A1C: 9.9

## 2017-10-16 MED ORDER — ENALAPRIL MALEATE 10 MG PO TABS: 10.0000 mg | ORAL_TABLET | Freq: Every day | ORAL | 1 refills | Status: DC

## 2017-10-16 MED ORDER — METFORMIN HCL 500 MG PO TABS: 500.0000 mg | ORAL_TABLET | Freq: Two times a day (BID) | ORAL | 1 refills | Status: DC

## 2017-10-16 MED ORDER — BLOOD GLUCOSE MONITOR KIT: PACK | 5 refills | Status: AC

## 2017-10-16 MED ORDER — FREESTYLE LANCETS MISC: 5 refills | Status: AC

## 2017-10-16 MED ORDER — AMOXICILLIN 500 MG PO CAPS: 500.0000 mg | ORAL_CAPSULE | Freq: Three times a day (TID) | ORAL | 0 refills | Status: DC

## 2017-10-16 MED ORDER — GLIPIZIDE 5 MG PO TABS: ORAL_TABLET | ORAL | 1 refills | Status: DC

## 2017-10-16 NOTE — Progress Notes (Signed)
   Subjective:    Patient ID: Brianna [name], female    DOB: 08/19/1990, 27 y.o.   MRN: [id number]  Diabetes  She presents for her follow-up diabetic visit. She has type 2 diabetes mellitus. Risk factors for coronary artery disease include diabetes mellitus and obesity. Current diabetic treatment includes oral agent (dual therapy). She is compliant with treatment all of the time. Her weight is stable. She is following a diabetic diet. She has not had a previous visit with a dietitian. She does not see a podiatrist.  Results for orders placed or performed in visit on 04/18/17  POCT glycosylated hemoglobin (Hb A1C)  Result Value Ref Range   Hemoglobin A1C 8.4   HM DIABETES EYE EXAM  Result Value Ref Range   HM Diabetic Eye Exam No Retinopathy No Retinopathy    9.9 %  Glu quite high   Pt takes all of her night meds but not morn ones  Reports diet is terriblemom cooking in the house, mo has   driks water  Couple ginger ales per day  Review of Systems No headache, no major weight loss or weight gain, no chest pain no back pain abdominal pain no change in bowel habits complete ROS otherwise negative     Objective:   Physical Exam  Alert vitals stable, NAD. Blood pressure good on repeat. HEENT normal. Lungs clear. Heart regular rate and rhythm.       Assessment & Plan:  Impression type 2 diabetes with terrible compliance.  Multiple discussions before this regard.  Still amazingly not taking her morning medications.  Also in her words eats terrible.  Also no exercise long discussion once again.  Patient simply requested to take her morning medicines as already ordered also will re-prescribe monitor sugars as advised recheck in 3 months

## 2017-12-04 ENCOUNTER — Telehealth: Payer: Self-pay | Admitting: Family Medicine

## 2017-12-04 NOTE — Telephone Encounter (Signed)
Order in green folder in yellow box   Fax received from Dr. Raford Pitcher, DDS - Need signature from Dr. Brett Canales for a home sleep test    (never heard of this before through a dentist office so I called and spoke with Neysa Bonito - she explained that they are the only office in [facility] that will do this for the patient.  They have a device that with do a HST and treat mild sleep apnea.  They have a company that will process prior authorizations and supply patient with device for treatment if needed.  If the HST results show anything severe they will then refer our pt to [place] so that we may order a titration study and CPAP if necessary)  If there are any questions, please call Christy at Dr. Laveda Norman office [phone]   Please advise & forward signed order to Brendale to be documented & faxed

## 2017-12-05 LAB — HM DIABETES EYE EXAM

## 2017-12-07 NOTE — Telephone Encounter (Signed)
Faxed signed order, sent to be scanned, & filed

## 2018-01-16 ENCOUNTER — Ambulatory Visit: Payer: BLUE CROSS/BLUE SHIELD | Admitting: Family Medicine

## 2018-02-03 ENCOUNTER — Other Ambulatory Visit: Payer: Self-pay | Admitting: Family Medicine

## 2018-02-19 ENCOUNTER — Encounter: Payer: Self-pay | Admitting: *Deleted

## 2018-03-01 ENCOUNTER — Ambulatory Visit: Payer: BLUE CROSS/BLUE SHIELD | Admitting: Family Medicine

## 2018-05-29 ENCOUNTER — Ambulatory Visit: Payer: BLUE CROSS/BLUE SHIELD | Admitting: Family Medicine

## 2018-06-05 ENCOUNTER — Ambulatory Visit (INDEPENDENT_AMBULATORY_CARE_PROVIDER_SITE_OTHER): Payer: BLUE CROSS/BLUE SHIELD | Admitting: Family Medicine

## 2018-06-05 ENCOUNTER — Encounter: Payer: Self-pay | Admitting: Family Medicine

## 2018-06-05 VITALS — BP 138/82 | Ht 66.0 in | Wt 323.8 lb

## 2018-06-05 DIAGNOSIS — Z23 Encounter for immunization: Secondary | ICD-10-CM | POA: Diagnosis not present

## 2018-06-05 DIAGNOSIS — Z79899 Other long term (current) drug therapy: Secondary | ICD-10-CM

## 2018-06-05 DIAGNOSIS — Z13220 Encounter for screening for lipoid disorders: Secondary | ICD-10-CM | POA: Diagnosis not present

## 2018-06-05 DIAGNOSIS — I10 Essential (primary) hypertension: Secondary | ICD-10-CM | POA: Diagnosis not present

## 2018-06-05 DIAGNOSIS — E119 Type 2 diabetes mellitus without complications: Secondary | ICD-10-CM | POA: Diagnosis not present

## 2018-06-05 DIAGNOSIS — L609 Nail disorder, unspecified: Secondary | ICD-10-CM

## 2018-06-05 LAB — POCT GLYCOSYLATED HEMOGLOBIN (HGB A1C): Hemoglobin A1C: 9.4 % — AB (ref 4.0–5.6)

## 2018-06-05 MED ORDER — METFORMIN HCL ER 750 MG PO TB24: 750.0000 mg | ORAL_TABLET | Freq: Every day | ORAL | 0 refills | Status: DC

## 2018-06-05 MED ORDER — GLIPIZIDE ER 10 MG PO TB24: 10.0000 mg | ORAL_TABLET | Freq: Every day | ORAL | 0 refills | Status: DC

## 2018-06-05 MED ORDER — ENALAPRIL MALEATE 10 MG PO TABS: 10.0000 mg | ORAL_TABLET | Freq: Every day | ORAL | 1 refills | Status: DC

## 2018-06-05 NOTE — Progress Notes (Signed)
Subjective:    Patient ID: Brianna [name], female    DOB: [birth year], 27 y.o.   MRN: [id number]  Diabetes  She presents for her follow-up diabetic visit. She has type 2 diabetes mellitus. Pertinent negatives for hypoglycemia include no dizziness or headaches. Pertinent negatives for diabetes include no chest pain and no weakness. Compliance with diabetes treatment: pt has not been taking morning meds only at night. Home blood sugar record trend: does not check blood sugar. She does not see a podiatrist.Eye exam is current (May 2019).   Diabetes: Taking metformin 1 tablet at night. Taking 2 tablets of glipizide at night. Does not take morning doses of medications. States she is unable to take morning dose of metformin d/t diarrhea and not eating breakfast in the morning. Denies checking blood sugars at home, rare symptoms of hypoglycemia, states she just knows she needs to go eat something. States not eating healthy or exercising. States work keeps her from doing these things. Pt reports dark stripe to toe nail of left second toe x 1 year, denies change, otherwise no problems with feet.  Pt states no concerns today. Needs refill on enalapril.   HTN: compliant with current medication. Denies adverse effects. Does not check BP at home.   Results for orders placed or performed in visit on 06/05/18  POCT glycosylated hemoglobin (Hb A1C)  Result Value Ref Range   Hemoglobin A1C 9.4 (A) 4.0 - 5.6 %   HbA1c POC (<> result, manual entry)     HbA1c, POC (prediabetic range)     HbA1c, POC (controlled diabetic range)     Review of Systems  Constitutional: Negative for activity change, appetite change and unexpected weight change.  Eyes: Negative for visual disturbance.  Respiratory: Negative for shortness of breath.   Cardiovascular: Negative for chest pain.  Neurological: Negative for dizziness, syncope, weakness, light-headedness, numbness and headaches.       Objective:   Physical Exam    Constitutional: She is oriented to person, place, and time. She appears well-developed and well-nourished. No distress.  HENT:  Head: Normocephalic and atraumatic.  Neck: Neck supple. Carotid bruit is not present.  Cardiovascular: Normal rate, regular rhythm and normal heart sounds.  No murmur heard. Pulmonary/Chest: Effort normal and breath sounds normal. No respiratory distress.  Neurological: She is alert and oriented to person, place, and time.  Skin: Skin is warm and dry.  Left foot 2nd toe nail with dark stripe to medial border of nail, not painful, no sign of infection  Psychiatric: She has a normal mood and affect.  Nursing note and vitals reviewed.         Assessment & Plan:  1. Diabetes mellitus without complication (HCC) - Plan: POCT glycosylated hemoglobin (Hb A1C), Microalbumin / creatinine urine ratio The patient was seen today as part of a comprehensive visit for diabetes. Discussed the importance of keeping A1c at or below 7, as well as the importance of adherence to medication as prescribed.   Encouraged regular physical activity as well as a controlled low starch/sugar diet.   Standard follow-up visit recommended.  Patient aware that failure to keep diabetes under control increases the risk of complications.  Lengthy discussion with patient today regarding compliance with diabetes medications.  Discussed other treatment options including addition of a once weekly injectable.  Patient is not open to considering injections at this time.  We will change her metformin and glipizide to long-acting formulations to ensure better coverage over a 24-hour period with  once daily dosing.  Patient is agreeable to this.  Discussed at length the importance of diet and exercise, recommended lifestyle changes.  Patient states she will work on these.  2. Essential hypertension - Plan: Basic metabolic panel Blood pressure slightly elevated today but under decent control, will continue  current medication and check blood pressure at next appointment.  3. Nail abnormality - Plan: Ambulatory referral to Dermatology Referral to dermatology for further evaluation of dark stripe to medial border of 2nd left toe to rule out possibility of melanoma.  4. Need for vaccination - Plan: Flu Vaccine QUAD 6+ mos PF IM (Fluarix Quad PF)  5. Screening for lipid disorders - Plan: Lipid panel  6. High risk medication use - Plan: Hepatic function panel  Lab work ordered today, pt will obtain when fasting, will notify of results. Highly encouraged annual wellness exam as patient has never had a pap smear, to schedule in the next month or two. F/u on diabetes in 4 months, will recheck A1c at that time, if still not under good control will need to consider an injectable medication for triple therapy. Pt verbalized understanding.  25 minutes was spent with the patient.  This statement verifies that 25 minutes was indeed spent with the patient.  More than 50% of this visit-total duration of the visit-was spent in counseling and coordination of care. The issues that the patient came in for today as reflected in the diagnosis (s) please refer to documentation for further details.  Dr. Lilyan PuntScott Luking was consulted on this case and is in agreement with the above treatment plan.

## 2018-06-19 LAB — BASIC METABOLIC PANEL
BUN / CREAT RATIO: 20 (ref 9–23)
BUN: 10 mg/dL (ref 6–20)
CO2: 21 mmol/L (ref 20–29)
CREATININE: 0.51 mg/dL — AB (ref 0.57–1.00)
Calcium: 9.3 mg/dL (ref 8.7–10.2)
Chloride: 101 mmol/L (ref 96–106)
GFR calc Af Amer: 153 mL/min/{1.73_m2} (ref >59)
GFR calc non Af Amer: 133 mL/min/{1.73_m2} (ref >59)
GLUCOSE: 236 mg/dL — AB (ref 65–99)
POTASSIUM: 4.5 mmol/L (ref 3.5–5.2)
SODIUM: 138 mmol/L (ref 134–144)

## 2018-06-19 LAB — LIPID PANEL
CHOLESTEROL TOTAL: 172 mg/dL (ref 100–199)
Chol/HDL Ratio: 4 ratio (ref 0.0–4.4)
HDL: 43 mg/dL (ref >39)
LDL Calculated: 117 mg/dL — ABNORMAL HIGH (ref 0–99)
TRIGLYCERIDES: 59 mg/dL (ref 0–149)
VLDL Cholesterol Cal: 12 mg/dL (ref 5–40)

## 2018-06-19 LAB — HEPATIC FUNCTION PANEL
ALT: 12 IU/L (ref 0–32)
AST: 9 IU/L (ref 0–40)
Albumin: 4.2 g/dL (ref 3.5–5.5)
Alkaline Phosphatase: 66 IU/L (ref 39–117)
BILIRUBIN, DIRECT: 0.1 mg/dL (ref 0.00–0.40)
Bilirubin Total: 0.2 mg/dL (ref 0.0–1.2)
Total Protein: 7.2 g/dL (ref 6.0–8.5)

## 2018-06-19 LAB — MICROALBUMIN / CREATININE URINE RATIO
CREATININE, UR: 87.8 mg/dL
MICROALBUM., U, RANDOM: 26.3 ug/mL
Microalb/Creat Ratio: 30 mg/g creat (ref 0.0–30.0)

## 2018-06-27 ENCOUNTER — Encounter: Payer: BLUE CROSS/BLUE SHIELD | Admitting: Family Medicine

## 2018-07-05 ENCOUNTER — Encounter: Payer: Self-pay | Admitting: Family Medicine

## 2018-07-05 ENCOUNTER — Other Ambulatory Visit: Payer: Self-pay | Admitting: Family Medicine

## 2018-07-05 ENCOUNTER — Ambulatory Visit (INDEPENDENT_AMBULATORY_CARE_PROVIDER_SITE_OTHER): Payer: BLUE CROSS/BLUE SHIELD | Admitting: Family Medicine

## 2018-07-05 VITALS — BP 132/82 | Ht 66.0 in | Wt 320.4 lb

## 2018-07-05 DIAGNOSIS — Z124 Encounter for screening for malignant neoplasm of cervix: Secondary | ICD-10-CM

## 2018-07-05 DIAGNOSIS — E669 Obesity, unspecified: Secondary | ICD-10-CM | POA: Diagnosis not present

## 2018-07-05 DIAGNOSIS — E785 Hyperlipidemia, unspecified: Secondary | ICD-10-CM | POA: Diagnosis not present

## 2018-07-05 DIAGNOSIS — Z8639 Personal history of other endocrine, nutritional and metabolic disease: Secondary | ICD-10-CM

## 2018-07-05 DIAGNOSIS — E1169 Type 2 diabetes mellitus with other specified complication: Secondary | ICD-10-CM | POA: Diagnosis not present

## 2018-07-05 DIAGNOSIS — Z01419 Encounter for gynecological examination (general) (routine) without abnormal findings: Secondary | ICD-10-CM

## 2018-07-05 DIAGNOSIS — L700 Acne vulgaris: Secondary | ICD-10-CM | POA: Diagnosis not present

## 2018-07-05 MED ORDER — BENZOYL PEROXIDE-ERYTHROMYCIN 5-3 % EX GEL: Freq: Two times a day (BID) | CUTANEOUS | 2 refills | Status: DC

## 2018-07-05 NOTE — Progress Notes (Signed)
Subjective:    Patient ID: Brianna [name], female    DOB: Apr 14, 1991, 27 y.o.   MRN: [id number]  HPI  The patient comes in today for a wellness visit.  Not sexually active per patient-last cycle last week, hx of irregular cycles. Has been monthly for the last year. Menses x 3-4 days, reports a lot of clots with bleeding. Some cramping on day 1 but not severe.   A review of their health history was completed. A review of medications was also completed.  Any needed refills; no  Eating habits: sometimes healthy-not often; not doing so well   Falls/  MVA accidents in past few months: none  Regular exercise: no  Specialist pt sees on regular basis: no  Preventative health issues were discussed. Thinks she got a tetanus shot 3-4 years ago.   Additional concerns: upper back pain and acne  Reports pain to left upper back x 2 days, denies injury but thinks she slept on it funny, pain only with certain movements. Denies any paresthesias or weakness.   Pt concerned about acne and dark spots to her face from previous acne and wants to know what she can do for this. Is not interested in the birth control pill.    Review of Systems  Constitutional: Negative for chills, fatigue, fever and unexpected weight change.  HENT: Negative for congestion, ear pain, sinus pressure, sinus pain and sore throat.   Eyes: Negative for discharge and visual disturbance.  Respiratory: Negative for cough, shortness of breath and wheezing.   Cardiovascular: Negative for chest pain and leg swelling.  Gastrointestinal: Negative for abdominal pain, blood in stool, constipation, diarrhea, nausea and vomiting.  Genitourinary: Negative for difficulty urinating and hematuria.  Neurological: Negative for dizziness, weakness, light-headedness and headaches.  All other systems reviewed and are negative.      Objective:   Physical Exam Vitals signs and nursing note reviewed. Exam conducted with a chaperone  present.  Constitutional:      General: She is not in acute distress.    Appearance: She is well-developed.  HENT:     Head: Normocephalic and atraumatic.     Right Ear: Tympanic membrane normal.     Left Ear: Tympanic membrane normal.     Nose: Nose normal.     Mouth/Throat:     Pharynx: Uvula midline.  Eyes:     General:        Right eye: No discharge.        Left eye: No discharge.     Conjunctiva/sclera: Conjunctivae normal.     Pupils: Pupils are equal, round, and reactive to light.  Neck:     Musculoskeletal: Neck supple.     Thyroid: No thyromegaly.  Cardiovascular:     Rate and Rhythm: Normal rate and regular rhythm.     Heart sounds: Normal heart sounds. No murmur.  Pulmonary:     Effort: Pulmonary effort is normal. No respiratory distress.     Breath sounds: Normal breath sounds. No wheezing.  Chest:     Breasts: Breasts are symmetrical.        Right: Normal.        Left: Normal.  Abdominal:     General: Bowel sounds are normal. There is no distension.     Palpations: Abdomen is soft. There is no mass.     Tenderness: There is no abdominal tenderness.  Genitourinary:    General: Normal vulva.     Labia:  Right: No rash, tenderness or lesion.        Left: No rash, tenderness or lesion.      Comments: Pelvic exam limited d/t pt's virginity and abdominal girth. Visualized only part of the cervix and did a blind pap smear sample. No abnormalities noted, no tenderness or obvious masses appreciated on bimanual exam though again limited d/t abdominal girth. Musculoskeletal:        General: No tenderness or deformity.  Lymphadenopathy:     Cervical: No cervical adenopathy.  Skin:    General: Skin is warm and dry.     Comments: Multiple small areas of hyperpigmentation noted to face along with few scattered small pustules.   Neurological:     Mental Status: She is alert and oriented to person, place, and time.     Coordination: Coordination normal.    Psychiatric:        Mood and Affect: Mood normal.    Results for orders placed or performed in visit on 06/05/18  Lipid panel  Result Value Ref Range   Cholesterol, Total 172 100 - 199 mg/dL   Triglycerides 59 0 - 149 mg/dL   HDL 43 >40>39 mg/dL   VLDL Cholesterol Cal 12 5 - 40 mg/dL   LDL Calculated 981117 (H) 0 - 99 mg/dL   Chol/HDL Ratio 4.0 0.0 - 4.4 ratio  Hepatic function panel  Result Value Ref Range   Total Protein 7.2 6.0 - 8.5 g/dL   Albumin 4.2 3.5 - 5.5 g/dL   Bilirubin Total 0.2 0.0 - 1.2 mg/dL   Bilirubin, Direct 1.910.10 0.00 - 0.40 mg/dL   Alkaline Phosphatase 66 39 - 117 IU/L   AST 9 0 - 40 IU/L   ALT 12 0 - 32 IU/L  Basic metabolic panel  Result Value Ref Range   Glucose 236 (H) 65 - 99 mg/dL   BUN 10 6 - 20 mg/dL   Creatinine, Ser 4.780.51 (L) 0.57 - 1.00 mg/dL   GFR calc non Af Amer 133 >59 mL/min/1.73   GFR calc Af Amer 153 >59 mL/min/1.73   BUN/Creatinine Ratio 20 9 - 23   Sodium 138 134 - 144 mmol/L   Potassium 4.5 3.5 - 5.2 mmol/L   Chloride 101 96 - 106 mmol/L   CO2 21 20 - 29 mmol/L   Calcium 9.3 8.7 - 10.2 mg/dL  Microalbumin / creatinine urine ratio  Result Value Ref Range   Creatinine, Urine 87.8 Not Estab. mg/dL   Microalbumin, Urine 29.526.3 Not Estab. ug/mL   Microalb/Creat Ratio 30.0 0.0 - 30.0 mg/g creat  POCT glycosylated hemoglobin (Hb A1C)  Result Value Ref Range   Hemoglobin A1C 9.4 (A) 4.0 - 5.6 %   HbA1c POC (<> result, manual entry)     HbA1c, POC (prediabetic range)     HbA1c, POC (controlled diabetic range)          Assessment & Plan:  1. Well woman exam Adult wellness-complete.wellness physical was conducted today. Importance of diet and exercise were discussed in detail.  In addition to this a discussion regarding safety was also covered. We also reviewed over immunizations and gave recommendations regarding current immunization needed for age.  In addition to this additional areas were also touched on including: Preventative health  exams needed:  Pap smear done today.  Patient was advised yearly wellness exam. Reviewed with patient recent lab work.  2. Screening for cervical cancer - Plan: Pap IG w/ reflex to HPV when ASC-U Pap smear done  today, blind sweep d/t limited exam.  3. History of thyroid disease - Plan: TSH Pt reports hx of taking thyroid medication. Requesting TSH levels be checked. Will notify of results.  4. Morbid obesity (HCC) Lengthy discussion with patient regarding need for weight loss, working on healthy diet and exercise. Suggested bringing healthy snack options to work, walking on treadmill for 10 minutes three days a week and building from there. Pt states she will try these.  5. Hyperlipidemia associated with type 2 diabetes mellitus (HCC) LDL is still elevated at 117. Recommendation for pt's with diabetes is < 70. Discussed likelihood of needing to start a statin medication for this. Pt states she will think this over and let us know what she decides to do.  6. Diabetes mellitus type 2 in obese (HCC) Pt reports compliance with change in medication therapy. Still not checking blood sugars at home. Recommended she start logging her blood sugars at home and bring those with her at her next appt.   7. Acne Recommended oral contraceptive pills to help with her acne and regulating her cycles better, but pt declines. Would like to try a topical acne medication. Benzomycin gel rx, will let us know how this is doing.   Keep her scheduled f/u appt for her DM in the next couple months.

## 2018-07-06 ENCOUNTER — Other Ambulatory Visit: Payer: Self-pay | Admitting: Family Medicine

## 2018-07-06 DIAGNOSIS — Z8639 Personal history of other endocrine, nutritional and metabolic disease: Secondary | ICD-10-CM

## 2018-07-06 LAB — TSH: TSH: 5.75 u[IU]/mL — ABNORMAL HIGH (ref 0.450–4.500)

## 2018-07-10 ENCOUNTER — Encounter: Payer: BLUE CROSS/BLUE SHIELD | Admitting: Family Medicine

## 2018-07-10 LAB — PAP IG W/ RFLX HPV ASCU

## 2018-07-16 ENCOUNTER — Other Ambulatory Visit: Payer: Self-pay | Admitting: *Deleted

## 2018-07-16 DIAGNOSIS — R7989 Other specified abnormal findings of blood chemistry: Secondary | ICD-10-CM

## 2018-08-26 ENCOUNTER — Other Ambulatory Visit: Payer: Self-pay | Admitting: Family Medicine

## 2018-10-03 ENCOUNTER — Other Ambulatory Visit: Payer: Self-pay

## 2018-10-03 ENCOUNTER — Encounter: Payer: Self-pay | Admitting: Family Medicine

## 2018-10-03 ENCOUNTER — Ambulatory Visit (INDEPENDENT_AMBULATORY_CARE_PROVIDER_SITE_OTHER): Payer: BLUE CROSS/BLUE SHIELD | Admitting: Family Medicine

## 2018-10-03 VITALS — BP 170/92 | Ht 66.0 in | Wt 307.0 lb

## 2018-10-03 DIAGNOSIS — E669 Obesity, unspecified: Secondary | ICD-10-CM

## 2018-10-03 DIAGNOSIS — E1169 Type 2 diabetes mellitus with other specified complication: Secondary | ICD-10-CM

## 2018-10-03 DIAGNOSIS — I10 Essential (primary) hypertension: Secondary | ICD-10-CM | POA: Diagnosis not present

## 2018-10-03 DIAGNOSIS — E785 Hyperlipidemia, unspecified: Secondary | ICD-10-CM

## 2018-10-03 LAB — POCT GLYCOSYLATED HEMOGLOBIN (HGB A1C): Hemoglobin A1C: 6.6 % — AB (ref 4.0–5.6)

## 2018-10-03 MED ORDER — ENALAPRIL MALEATE 20 MG PO TABS: 20.0000 mg | ORAL_TABLET | Freq: Every day | ORAL | 1 refills | Status: DC

## 2018-10-03 MED ORDER — METFORMIN HCL ER 750 MG PO TB24: ORAL_TABLET | ORAL | 1 refills | Status: DC

## 2018-10-03 MED ORDER — GLIPIZIDE ER 5 MG PO TB24: 5.0000 mg | ORAL_TABLET | Freq: Every day | ORAL | 1 refills | Status: DC

## 2018-10-03 NOTE — Progress Notes (Signed)
Subjective:    Patient ID: Brianna [name], female    DOB: 1991-06-29, 28 y.o.   MRN: [id number]  HPI Patient is here today to follow up on her chronic health issues.  Hypertension: She takes Enalapril 10 mg once per day. Has wrist cuff at home and states has been running high. Reports compliance with medication. Denies adverse effects.   Diabetes: Glipizide 10 mg once per day and Metformin 750 mg once per day. Taking both at night. Fasting blood sugars ranging from 80s-120. Denies side effects of hypoglycemia, has had some fasting blood sugars in the 70s.   Hyperlipidemia: Working on diet and exercise  Obesity: She is eating healthier and gets some exercise. Has been losing weight.   She does not see any specialist.  Review of Systems  Constitutional: Negative for chills and fever.  Eyes: Negative for visual disturbance.  Respiratory: Negative for shortness of breath.   Cardiovascular: Negative for chest pain and leg swelling.  Skin: Negative for color change and wound.  Neurological: Negative for dizziness, syncope, weakness, light-headedness, numbness and headaches.        Objective:   Physical Exam Vitals signs and nursing note reviewed.  Constitutional:      General: She is not in acute distress.    Appearance: She is well-developed.  HENT:     Head: Normocephalic and atraumatic.  Neck:     Musculoskeletal: Neck supple.  Cardiovascular:     Rate and Rhythm: Normal rate and regular rhythm.     Heart sounds: Normal heart sounds. No murmur.  Pulmonary:     Effort: Pulmonary effort is normal. No respiratory distress.     Breath sounds: Normal breath sounds.  Skin:    General: Skin is warm and dry.  Neurological:     Mental Status: She is alert and oriented to person, place, and time.  Psychiatric:        Mood and Affect: Mood normal.        Behavior: Behavior normal.    Again dark stripe to left second toe nail noted. Pt states has been unchanged, has not been  able to go to dermatologist d/t cost.   Results for orders placed or performed in visit on 10/03/18  POCT glycosylated hemoglobin (Hb A1C)  Result Value Ref Range   Hemoglobin A1C 6.6 (A) 4.0 - 5.6 %   HbA1c POC (<> result, manual entry)     HbA1c, POC (prediabetic range)     HbA1c, POC (controlled diabetic range)           Assessment & Plan:  1. Diabetes mellitus type 2 in obese (HCC) - Plan: POCT glycosylated hemoglobin (Hb A1C) The patient was seen today as part of a comprehensive visit for diabetes. Discussed the importance of keeping A1c at or below 7, as well as the importance of adherence to medication as prescribed.   Encouraged regular physical activity as well as a controlled low starch/sugar diet.   Standard follow-up visit recommended.  Patient aware that failure to keep diabetes under control increases the risk of complications.  Pt with significant improvement in her A1c today, has been compliant with medication regimen since switching to long-acting formulations and working on diet and exercise. Has lost weight since last visit. Blood sugars are a little on the low side fasting, recommend decreasing glipizide to 5 mg daily, continue with metformin at current dose. She should continue to keep track of her blood sugars, if fasting continue to be less  than 80 she should notify us, discussed goal fasting is 90-130. Pt verbalized understanding.  2. Essential hypertension BP is significantly elevated today. Pt reports compliance with her medication. Will increase to 20 mg daily. Recommend DASH diet and encouraged her continued efforts at diet and exercise.   3. Hyperlipidemia associated with type 2 diabetes mellitus (HCC) Will recheck at next visit, if still elevated will need to consider statin.   4. Morbid obesity (HCC) Pt doing well with weight loss efforts. Encouragement given.   Pt with dark stripe to toe nail, has not been to see dermatologist as previously  recommended, strongly encouraged her to make that appointment in order to rule out possibility of melanoma. She verbalized understanding.  Dr. Lubertha South was consulted on this case and is in agreement with the above treatment plan.

## 2018-10-03 NOTE — Patient Instructions (Signed)
Increase dose of enalapril to 20 mg daily. Keep track of your blood pressure readings at home. Goal is less than 140/90.   Decrease the glipizide to 5 mg daily with meal. Continue metformin at current dose. Keep track of blood sugar readings. Goal fasting blood sugar is 90-130. If you continue to notice blood sugars less than 80 let us know.   Keep up the good work with healthy eating and exercise!   DASH Eating Plan DASH stands for "Dietary Approaches to Stop Hypertension." The DASH eating plan is a healthy eating plan that has been shown to reduce high blood pressure (hypertension). It may also reduce your risk for type 2 diabetes, heart disease, and stroke. The DASH eating plan may also help with weight loss. What are tips for following this plan?  General guidelines  Avoid eating more than 2,300 mg (milligrams) of salt (sodium) a day. If you have hypertension, you may need to reduce your sodium intake to 1,500 mg a day.  Limit alcohol intake to no more than 1 drink a day for nonpregnant women and 2 drinks a day for men. One drink equals 12 oz of beer, 5 oz of wine, or 1 oz of hard liquor.  Work with your health care provider to maintain a healthy body weight or to lose weight. Ask what an ideal weight is for you.  Get at least 30 minutes of exercise that causes your heart to beat faster (aerobic exercise) most days of the week. Activities may include walking, swimming, or biking.  Work with your health care provider or diet and nutrition specialist (dietitian) to adjust your eating plan to your individual calorie needs. Reading food labels   Check food labels for the amount of sodium per serving. Choose foods with less than 5 percent of the Daily Value of sodium. Generally, foods with less than 300 mg of sodium per serving fit into this eating plan.  To find whole grains, look for the word "whole" as the first word in the ingredient list. Shopping  Buy products labeled as  "low-sodium" or "no salt added."  Buy fresh foods. Avoid canned foods and premade or frozen meals. Cooking  Avoid adding salt when cooking. Use salt-free seasonings or herbs instead of table salt or sea salt. Check with your health care provider or pharmacist before using salt substitutes.  Do not fry foods. Cook foods using healthy methods such as baking, boiling, grilling, and broiling instead.  Cook with heart-healthy oils, such as olive, canola, soybean, or sunflower oil. Meal planning  Eat a balanced diet that includes: ? 5 or more servings of fruits and vegetables each day. At each meal, try to fill half of your plate with fruits and vegetables. ? Up to 6-8 servings of whole grains each day. ? Less than 6 oz of lean meat, poultry, or fish each day. A 3-oz serving of meat is about the same size as a deck of cards. One egg equals 1 oz. ? 2 servings of low-fat dairy each day. ? A serving of nuts, seeds, or beans 5 times each week. ? Heart-healthy fats. Healthy fats called Omega-3 fatty acids are found in foods such as flaxseeds and coldwater fish, like sardines, salmon, and mackerel.  Limit how much you eat of the following: ? Canned or prepackaged foods. ? Food that is high in trans fat, such as fried foods. ? Food that is high in saturated fat, such as fatty meat. ? Sweets, desserts, sugary drinks, and  other foods with added sugar. ? Full-fat dairy products.  Do not salt foods before eating.  Try to eat at least 2 vegetarian meals each week.  Eat more home-cooked food and less restaurant, buffet, and fast food.  When eating at a restaurant, ask that your food be prepared with less salt or no salt, if possible. What foods are recommended? The items listed may not be a complete list. Talk with your dietitian about what dietary choices are best for you. Grains Whole-grain or whole-wheat bread. Whole-grain or whole-wheat pasta. Brown rice. [place]. Bulgur. Whole-grain  and low-sodium cereals. Pita bread. Low-fat, low-sodium crackers. Whole-wheat flour tortillas. Vegetables Fresh or frozen vegetables (raw, steamed, roasted, or grilled). Low-sodium or reduced-sodium tomato and vegetable juice. Low-sodium or reduced-sodium tomato sauce and tomato paste. Low-sodium or reduced-sodium canned vegetables. Fruits All fresh, dried, or frozen fruit. Canned fruit in natural juice (without added sugar). Meat and other protein foods Skinless chicken or [place]. Ground chicken or [place]. Pork with fat trimmed off. Fish and seafood. Egg whites. Dried beans, peas, or lentils. Unsalted nuts, nut butters, and seeds. Unsalted canned beans. Lean cuts of beef with fat trimmed off. Low-sodium, lean deli meat. Dairy Low-fat (1%) or fat-free (skim) milk. Fat-free, low-fat, or reduced-fat cheeses. Nonfat, low-sodium ricotta or cottage cheese. Low-fat or nonfat yogurt. Low-fat, low-sodium cheese. Fats and oils Soft margarine without trans fats. Vegetable oil. Low-fat, reduced-fat, or light mayonnaise and salad dressings (reduced-sodium). Canola, safflower, olive, soybean, and sunflower oils. Avocado. Seasoning and other foods Herbs. Spices. Seasoning mixes without salt. Unsalted popcorn and pretzels. Fat-free sweets. What foods are not recommended? The items listed may not be a complete list. Talk with your dietitian about what dietary choices are best for you. Grains Baked goods made with fat, such as croissants, muffins, or some breads. Dry pasta or rice meal packs. Vegetables Creamed or fried vegetables. Vegetables in a cheese sauce. Regular canned vegetables (not low-sodium or reduced-sodium). Regular canned tomato sauce and paste (not low-sodium or reduced-sodium). Regular tomato and vegetable juice (not low-sodium or reduced-sodium). Angie Fava. Olives. Fruits Canned fruit in a light or heavy syrup. Fried fruit. Fruit in cream or butter sauce. Meat and other protein foods Fatty cuts  of meat. Ribs. Fried meat. Berniece Salines. Sausage. Bologna and other processed lunch meats. Salami. Fatback. Hotdogs. Bratwurst. Salted nuts and seeds. Canned beans with added salt. Canned or smoked fish. Whole eggs or egg yolks. Chicken or [place] with skin. Dairy Whole or 2% milk, cream, and half-and-half. Whole or full-fat cream cheese. Whole-fat or sweetened yogurt. Full-fat cheese. Nondairy creamers. Whipped toppings. Processed cheese and cheese spreads. Fats and oils Butter. Stick margarine. Lard. Shortening. Ghee. Bacon fat. Tropical oils, such as coconut, palm kernel, or palm oil. Seasoning and other foods Salted popcorn and pretzels. Onion salt, garlic salt, seasoned salt, table salt, and sea salt. Worcestershire sauce. Tartar sauce. Barbecue sauce. Teriyaki sauce. Soy sauce, including reduced-sodium. Steak sauce. Canned and packaged gravies. Fish sauce. Oyster sauce. Cocktail sauce. Horseradish that you find on the shelf. Ketchup. Mustard. Meat flavorings and tenderizers. Bouillon cubes. Hot sauce and Tabasco sauce. Premade or packaged marinades. Premade or packaged taco seasonings. Relishes. Regular salad dressings. Where to find more information:  National Heart, Lung, and [place]: https://wilson-eaton.com/  American Heart Association: www.heart.org Summary  The DASH eating plan is a healthy eating plan that has been shown to reduce high blood pressure (hypertension). It may also reduce your risk for type 2 diabetes, heart disease, and stroke.  With the DASH eating plan, you should limit salt (sodium) intake to 2,300 mg a day. If you have hypertension, you may need to reduce your sodium intake to 1,500 mg a day.  When on the DASH eating plan, aim to eat more fresh fruits and vegetables, whole grains, lean proteins, low-fat dairy, and heart-healthy fats.  Work with your health care provider or diet and nutrition specialist (dietitian) to adjust your eating plan to your individual calorie needs.  This information is not intended to replace advice given to you by your health care provider. Make sure you discuss any questions you have with your health care provider. Document Released: 06/30/2011 Document Revised: 07/04/2016 Document Reviewed: 07/04/2016 Elsevier Interactive Patient Education  2019 [employer].

## 2018-10-15 ENCOUNTER — Telehealth: Payer: Self-pay | Admitting: Family Medicine

## 2018-10-15 MED ORDER — KETOCONAZOLE 2 % EX CREA: 1.0000 "application " | TOPICAL_CREAM | Freq: Two times a day (BID) | CUTANEOUS | 0 refills | Status: DC

## 2018-10-15 NOTE — Telephone Encounter (Signed)
Please advise 

## 2018-10-15 NOTE — Telephone Encounter (Signed)
Patient is aware the medication has been sent in to the requested pharmacy.

## 2018-10-15 NOTE — Telephone Encounter (Signed)
Ketoconazole cr bid 15 grams

## 2018-10-15 NOTE — Telephone Encounter (Signed)
Pt requesting something to be called for ringworm    Had it for 4 weeks, tired OTC meds as Lillia Abed recommended & not helping  Please advise & call pt   [facility]

## 2018-12-24 ENCOUNTER — Other Ambulatory Visit: Payer: Self-pay

## 2018-12-24 MED ORDER — ENALAPRIL MALEATE 20 MG PO TABS: 20.0000 mg | ORAL_TABLET | Freq: Every day | ORAL | 1 refills | Status: DC

## 2019-02-01 ENCOUNTER — Other Ambulatory Visit: Payer: Self-pay

## 2019-02-01 ENCOUNTER — Ambulatory Visit: Payer: BC Managed Care – PPO | Admitting: Family Medicine

## 2019-02-05 ENCOUNTER — Other Ambulatory Visit: Payer: Self-pay

## 2019-02-06 ENCOUNTER — Ambulatory Visit (INDEPENDENT_AMBULATORY_CARE_PROVIDER_SITE_OTHER): Payer: BC Managed Care – PPO | Admitting: Family Medicine

## 2019-02-06 DIAGNOSIS — E1169 Type 2 diabetes mellitus with other specified complication: Secondary | ICD-10-CM | POA: Diagnosis not present

## 2019-02-06 DIAGNOSIS — E669 Obesity, unspecified: Secondary | ICD-10-CM | POA: Diagnosis not present

## 2019-02-06 DIAGNOSIS — I10 Essential (primary) hypertension: Secondary | ICD-10-CM | POA: Diagnosis not present

## 2019-02-06 DIAGNOSIS — E119 Type 2 diabetes mellitus without complications: Secondary | ICD-10-CM

## 2019-02-06 MED ORDER — GLIPIZIDE ER 5 MG PO TB24: 5.0000 mg | ORAL_TABLET | Freq: Every day | ORAL | 1 refills | Status: DC

## 2019-02-06 MED ORDER — METFORMIN HCL ER 750 MG PO TB24: ORAL_TABLET | ORAL | 1 refills | Status: DC

## 2019-02-06 MED ORDER — ENALAPRIL MALEATE 20 MG PO TABS: 20.0000 mg | ORAL_TABLET | Freq: Every day | ORAL | 1 refills | Status: DC

## 2019-02-06 NOTE — Progress Notes (Signed)
   Subjective:  Audio  Patient ID: Brianna [name], female    DOB: 1991/05/31, 28 y.o.   MRN: [id number]  Diabetes She presents for her follow-up diabetic visit. She has type 2 diabetes mellitus. There are no hypoglycemic associated symptoms. There are no diabetic associated symptoms. There are no hypoglycemic complications. There are no diabetic complications.  pt states she has not been checking sugars as directed. Pt states she is doing well;no additional questions or concerns.  Virtual Visit via Video Note  I connected with Brianna [name] on 02/06/19 at  3:00 PM EDT by a video enabled telemedicine application and verified that I am speaking with the correct person using two identifiers.  Location: Patient: home Provider: office   I discussed the limitations of evaluation and management by telemedicine and the availability of in person appointments. The patient expressed understanding and agreed to proceed.  History of Present Illness:    Observations/Objective:   Assessment and Plan:   Follow Up Instructions:    I discussed the assessment and treatment plan with the patient. The patient was provided an opportunity to ask questions and all were answered. The patient agreed with the plan and demonstrated an understanding of the instructions.   The patient was advised to call back or seek an in-person evaluation if the symptoms worsen or if the condition fails to improve as anticipated.  I provided 20 minutes of non-face-to-face time during this encounter.   Vicente Males, LPN  Patient claims compliance with diabetes medication. No obvious side effects. Reports no substantial low sugar spells. Most numbers are generally in good range when checked fasting. Generally does not miss a dose of medication. Watching diabetic diet closely    Review of Systems No headache, no major weight loss or weight gain, no chest pain no back pain abdominal pain no change in bowel habits  complete ROS otherwise negative     Objective:   Physical Exam   Virtual     Assessment & Plan:  Impression type 2 diabetes.  On further history patient notes some noncompliance.  Admits not always watching her diet.  Sometimes missing doses.  Long discussion held.  Patient promises she will do better.  Hold off on labs 6 months worth of meds follow-up then

## 2019-02-06 NOTE — Progress Notes (Signed)
   Subjective:    Patient ID: Brianna [name], female    DOB: May 27, 1991, 28 y.o.   MRN: [id number]  HPI    Review of Systems     Objective:   Physical Exam        Assessment & Plan:

## 2019-04-30 ENCOUNTER — Other Ambulatory Visit: Payer: BC Managed Care – PPO

## 2019-07-09 ENCOUNTER — Other Ambulatory Visit: Payer: Self-pay | Admitting: Family Medicine

## 2019-07-17 ENCOUNTER — Other Ambulatory Visit: Payer: Self-pay

## 2019-07-17 ENCOUNTER — Ambulatory Visit: Payer: PRIVATE HEALTH INSURANCE | Attending: Internal Medicine

## 2019-07-17 DIAGNOSIS — Z20822 Contact with and (suspected) exposure to covid-19: Secondary | ICD-10-CM

## 2019-07-18 LAB — NOVEL CORONAVIRUS, NAA: SARS-CoV-2, NAA: DETECTED — AB

## 2019-07-22 ENCOUNTER — Encounter: Payer: Self-pay | Admitting: Family Medicine

## 2019-07-22 NOTE — Telephone Encounter (Signed)
Called pt to schedule virtual appt. Pt states she cannot do visit because it cost $40. Tested positive on the 24th. Coughing up clear mucus. Wanted something for congestion. No fever, no sob.

## 2019-08-26 ENCOUNTER — Encounter: Payer: Self-pay | Admitting: Family Medicine

## 2019-09-04 ENCOUNTER — Other Ambulatory Visit: Payer: Self-pay

## 2019-09-04 ENCOUNTER — Ambulatory Visit (INDEPENDENT_AMBULATORY_CARE_PROVIDER_SITE_OTHER): Payer: BC Managed Care – PPO | Admitting: Family Medicine

## 2019-09-04 DIAGNOSIS — M5431 Sciatica, right side: Secondary | ICD-10-CM

## 2019-09-04 NOTE — Progress Notes (Signed)
   Subjective:    Patient ID: Brianna [name], female    DOB: [birth year], 29 y.o.   MRN: [id number]  HPIright leg pain. Started 4 days ago.  Pt states pain starts in hip and goes down to ankle. Not able to put weight on it much. Sitting makes pain worse.pain worse in the morning and as she walks it gets better.  Tried biofreeze and tylenol with little relief.   Virtual Visit via Telephone Note  I connected with Brianna [name] on 09/04/19 at 10:30 AM EST by telephone and verified that I am speaking with the correct person using two identifiers.  Location: Patient: home Provider: office   I discussed the limitations, risks, security and privacy concerns of performing an evaluation and management service by telephone and the availability of in person appointments. I also discussed with the patient that there may be a patient responsible charge related to this service. The patient expressed understanding and agreed to proceed.   History of Present Illness:    Observations/Objective:   Assessment and Plan:   Follow Up Instructions:    I discussed the assessment and treatment plan with the patient. The patient was provided an opportunity to ask questions and all were answered. The patient agreed with the plan and demonstrated an understanding of the instructions.   The patient was advised to call back or seek an in-person evaluation if the symptoms worsen or if the condition fails to improve as anticipated.  I provided 15 minutes of non-face-to-face time during this encounter.      Review of Systems Pain and discomfort in the right leg no swelling or tenderness denies back pain denies fever chills sweats.    Objective:   Physical Exam   It was determined after spending time on the phone that she needed to come to the office we will see her tomorrow for that in mild the appointment for tomorrow thank you     Assessment & Plan:  Pain and discomfort in her legs she had  previous surgery several years ago this sounds like sciatica.  Patient will come to the office for further evaluation

## 2019-09-05 ENCOUNTER — Ambulatory Visit (INDEPENDENT_AMBULATORY_CARE_PROVIDER_SITE_OTHER): Payer: BC Managed Care – PPO | Admitting: Family Medicine

## 2019-09-05 ENCOUNTER — Encounter: Payer: Self-pay | Admitting: Family Medicine

## 2019-09-05 VITALS — BP 138/86 | Ht 66.0 in | Wt 316.0 lb

## 2019-09-05 DIAGNOSIS — M5431 Sciatica, right side: Secondary | ICD-10-CM | POA: Diagnosis not present

## 2019-09-05 DIAGNOSIS — N6089 Other benign mammary dysplasias of unspecified breast: Secondary | ICD-10-CM | POA: Diagnosis not present

## 2019-09-05 MED ORDER — GABAPENTIN 100 MG PO CAPS: ORAL_CAPSULE | ORAL | 2 refills | Status: DC

## 2019-09-05 NOTE — Progress Notes (Signed)
   Subjective:    Patient ID: Brianna [name], female    DOB: 1991-04-09, 29 y.o.   MRN: [id number]  Leg Pain  Incident onset: 5 days. Pain location: right hip and radiates to right leg. She has tried acetaminophen (gabapentin ) for the symptoms.  Patient does relate leg pain along the lateral aspect of the leg down into the calf region.  Hurts to use her leg.  Worse when she bends.  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  She denies any specific injuries.  But does state she lifts a lot of water bottle cases as well as dog food bags she has had back surgery back in 2016  Bump under right breast. Not painful or itching. No treatment tried.  This patient is concerned about this area.  She states it started off as a small bump now it is a little more firm.  Its within the breast tissue on the right side.  No pain with it no numbness or tingling  Review of Systems  Constitutional: Negative for activity change, appetite change and fatigue.  HENT: Negative for congestion and rhinorrhea.   Respiratory: Negative for cough and shortness of breath.   Cardiovascular: Negative for chest pain and leg swelling.  Gastrointestinal: Negative for abdominal pain, nausea and vomiting.  Skin: Negative for color change.  Neurological: Negative for dizziness, weakness and headaches.  Psychiatric/Behavioral: Negative for agitation, behavioral problems and confusion.       Objective:   Physical Exam Vitals reviewed.  Constitutional:      General: She is not in acute distress. HENT:     Head: Normocephalic and atraumatic.  Eyes:     General:        Right eye: No discharge.        Left eye: No discharge.  Neck:     Trachea: No tracheal deviation.  Cardiovascular:     Rate and Rhythm: Normal rate and regular rhythm.     Heart sounds: Normal heart sounds. No murmur.  Pulmonary:     Effort: Pulmonary effort is normal. No respiratory distress.     Breath sounds: Normal breath sounds.    Lymphadenopathy:     Cervical: No cervical adenopathy.  Skin:    General: Skin is warm and dry.  Neurological:     Mental Status: She is alert.     Coordination: Coordination normal.  Psychiatric:        Behavior: Behavior normal.    Negative straight leg raise bilateral subjective discomfort along the lateral aspect right side Breast exam on the right side reveals a nodule in the lower anterior aspect that appears to be a sebaceous cyst but because it is within the breast tissue consultation with general surgery is recommended       Assessment & Plan:  Sebaceous cyst possible breast nodule referral to general surgery patient may need to have this area cut out to be 100% certain  Sciatica gabapentin start off 100 mg twice daily next several days then 3 times daily if causes excessive drowsiness stop medicine and call us give [place] feedback within 10 to 14 days how she is doing follow-up in 1 6 weeks if not resolved hold off on any type of x-rays or MRI currently  Work restrictions written for the patient

## 2019-09-06 ENCOUNTER — Telehealth: Payer: Self-pay | Admitting: Family Medicine

## 2019-09-06 DIAGNOSIS — N6089 Other benign mammary dysplasias of unspecified breast: Secondary | ICD-10-CM

## 2019-09-06 NOTE — Telephone Encounter (Signed)
Dr. Henreitta Leber (general surgery) office states they need imaging of pt's breast cyst to make sure it's a cyst  Please advise

## 2019-09-06 NOTE — Telephone Encounter (Signed)
Dr Nolene Bernheim would like an ultrasound of area to ensure it is a cyst

## 2019-09-06 NOTE — Telephone Encounter (Signed)
Breast ultrasound scheduled 09/10/19 at 11:20am at [facility]. Patient notified and verbalized understanding.

## 2019-09-06 NOTE — Telephone Encounter (Signed)
Breast U/S ordered in EPIC

## 2019-09-06 NOTE — Telephone Encounter (Signed)
Perfect Lets go ahead and order an ultrasound of it inform the patient have the ultrasound completed before Dr. Henreitta Leber sees her thank you

## 2019-09-06 NOTE — Telephone Encounter (Signed)
Please find out from Dr. Henreitta Leber office at patient's age 29 and given that this is a surface nodule do they want [place] to do a breast ultrasound or a diagnostic mammogram and an ultrasound?  Please see previous message from Kriste Basque that was from Dr. Henreitta Leber office Please send me the response afterwards thank you-Dr. Lorin Picket

## 2019-09-10 ENCOUNTER — Other Ambulatory Visit: Payer: Self-pay

## 2019-09-10 ENCOUNTER — Ambulatory Visit (HOSPITAL_COMMUNITY)
Admission: RE | Admit: 2019-09-10 | Discharge: 2019-09-10 | Disposition: A | Discharge status: Home / Self Care | Payer: BC Managed Care – PPO | Source: Ambulatory Visit | Attending: Family Medicine | Admitting: Family Medicine

## 2019-09-10 DIAGNOSIS — N6089 Other benign mammary dysplasias of unspecified breast: Secondary | ICD-10-CM | POA: Diagnosis not present

## 2019-09-18 ENCOUNTER — Other Ambulatory Visit: Payer: Self-pay | Admitting: *Deleted

## 2019-09-18 ENCOUNTER — Encounter: Payer: Self-pay | Admitting: Family Medicine

## 2019-09-18 MED ORDER — MELOXICAM 7.5 MG PO TABS: 7.5000 mg | ORAL_TABLET | Freq: Every day | ORAL | 0 refills | Status: DC

## 2019-09-18 NOTE — Telephone Encounter (Signed)
Nurses  Please have patient go ahead with increasing the gabapentin 1 in the morning, 1 midday, 2 in the evening Will need to do a new prescription for 120 with 2 refills  Also meloxicam 7.5 take 1 daily, #14 Also I would recommend a follow-up visit in 2 weeks with myself or Dr. Brett Canales  Patient was also seen for a small sebaceous cyst in the breast tissue Please find out from the patient did she do a follow-up with Dr. Henreitta Leber at this point?  The ultrasound shows that it is a sebaceous cyst not a cancer but if the patient is interested in having it removed she will need to see Dr. Henreitta Leber  Finally find out from the patient when she states she is having a hard time putting her shoe on is at because of weakness in the leg or difficult time bending?  If she is having weakness in the leg we will need to see her within the next several days either myself or Dr. Brett Canales thank you

## 2019-09-18 NOTE — Telephone Encounter (Signed)
Based upon all this I would try the meloxicam, gentle stretching exercises, follow-up in approximately 2 weeks for recheck either with myself or Dr. Brett Canales thank you

## 2019-09-18 NOTE — Telephone Encounter (Signed)
Called and discussed with pt. Pt states she was never able to get gabapentin from pharm. Requires PA. One was done on 2/12. I called pharm to see if med would go through now since we never heard back if it was approved or denied. Pharm states it is still requiring a PA. Pt states she has been taking her mom's gabapentin that was 100mg  one in the morning and one at night. I sent in meloxicam and notified pt. She has seen dr bridges and discussed the cyst. States she was told it was fine to do nothing and discussed removing it if she wanted to. Pt states trouble getting on her shoe not from weakness but because she cannot bend.

## 2019-10-08 ENCOUNTER — Encounter: Payer: Self-pay | Admitting: Family Medicine

## 2019-10-08 ENCOUNTER — Other Ambulatory Visit: Payer: Self-pay | Admitting: Family Medicine

## 2019-10-08 NOTE — Telephone Encounter (Signed)
Scheduled 4/13

## 2019-10-08 NOTE — Telephone Encounter (Signed)
Call pt, sched appt with me within next 6 wks

## 2019-10-11 NOTE — Telephone Encounter (Signed)
Contacted pt and informed her that with the symptoms pain and warmth to the touch, she will ned to be seen at ER or Urgent Care. No appt available for today. Pt verbalized understanding.

## 2019-10-16 ENCOUNTER — Other Ambulatory Visit: Payer: Self-pay | Admitting: Family Medicine

## 2019-11-05 ENCOUNTER — Ambulatory Visit: Payer: BC Managed Care – PPO | Admitting: Family Medicine

## 2019-11-05 ENCOUNTER — Encounter: Payer: Self-pay | Admitting: Family Medicine

## 2019-11-05 MED ORDER — KETOCONAZOLE 2 % EX CREA: 1.0000 "application " | TOPICAL_CREAM | Freq: Two times a day (BID) | CUTANEOUS | 0 refills | Status: DC

## 2019-11-05 NOTE — Telephone Encounter (Signed)
Merlyn Albert, MD     Ketovonazole cr 15 g bid to affected area

## 2019-12-06 ENCOUNTER — Other Ambulatory Visit: Payer: Self-pay

## 2019-12-06 ENCOUNTER — Ambulatory Visit: Payer: BC Managed Care – PPO | Admitting: Nurse Practitioner

## 2019-12-06 VITALS — BP 132/78 | Temp 97.6°F | Wt 310.8 lb

## 2019-12-06 DIAGNOSIS — R5383 Other fatigue: Secondary | ICD-10-CM | POA: Diagnosis not present

## 2019-12-06 DIAGNOSIS — E1169 Type 2 diabetes mellitus with other specified complication: Secondary | ICD-10-CM | POA: Diagnosis not present

## 2019-12-06 DIAGNOSIS — E669 Obesity, unspecified: Secondary | ICD-10-CM

## 2019-12-06 DIAGNOSIS — Z79899 Other long term (current) drug therapy: Secondary | ICD-10-CM

## 2019-12-06 DIAGNOSIS — E785 Hyperlipidemia, unspecified: Secondary | ICD-10-CM

## 2019-12-06 DIAGNOSIS — I10 Essential (primary) hypertension: Secondary | ICD-10-CM | POA: Diagnosis not present

## 2019-12-06 NOTE — Progress Notes (Signed)
   Subjective:    Patient ID: Brianna [name], female    DOB: [birth year], 29 y.o.   MRN: [id number]  HPI Presents today for a follow up on her diabetes. States her diet is terrible. Has not been exercising. Does not check her blood sugars routinely even with given materials. She does have routine eye exams and dental exams. Takes her blood preasure and diabetes medication as directed. She declines her pneumonia vaccine today.No other concerns or problems at this time.    Review of Systems  Constitutional: Negative for activity change, appetite change and unexpected weight change.  Eyes: Negative for visual disturbance.  Respiratory: Negative for cough, shortness of breath and wheezing.   Cardiovascular: Negative for chest pain and palpitations.  Gastrointestinal: Negative for constipation, diarrhea, nausea and vomiting.  Endocrine: Negative for polydipsia, polyphagia and polyuria.  Genitourinary: Negative for frequency and urgency.       Objective:   Physical Exam Vitals reviewed.  Constitutional:      Appearance: Normal appearance. She is obese.  Neck:     Thyroid: No thyroid mass or thyroid tenderness.  Cardiovascular:     Rate and Rhythm: Normal rate and regular rhythm.     Heart sounds: Normal heart sounds.  Pulmonary:     Breath sounds: Normal breath sounds.  Feet:     Right foot:     Skin integrity: No skin breakdown or dry skin.     Left foot:     Skin integrity: No skin breakdown or dry skin.     Comments: Monofilament exam normal  No tingling or numbness Lymphadenopathy:     Cervical: No cervical adenopathy.  Skin:    General: Skin is warm and dry.     Findings: No lesion or wound.  Neurological:     Mental Status: She is alert.    Diabetic Foot Exam - Simple   Simple Foot Form Diabetic Foot exam was performed with the following findings: Yes 12/06/2019 11:30 AM  Visual Inspection No deformities, no ulcerations, no other skin breakdown bilaterally: Yes  Sensation Testing Intact to touch and monofilament testing bilaterally: Yes Pulse Check Posterior Tibialis and Dorsalis pulse intact bilaterally: Yes Comments            Assessment & Plan:   Problem List Items Addressed This Visit      Cardiovascular and Mediastinum   HTN (hypertension)   Relevant Orders   Basic metabolic panel   Creatine     Endocrine   Diabetes mellitus type 2 in obese (HCC) - Primary   Relevant Orders   Hemoglobin A1c   Microalbumin / creatinine urine ratio   Hyperlipidemia associated with type 2 diabetes mellitus (HCC)    Other Visit Diagnoses    High risk medication use       Relevant Orders   TSH   Fatigue, unspecified type       Relevant Orders   CBC with Differential/Platelet   VITAMIN D 25 Hydroxy (Vit-D Deficiency, Fractures)   Creatine     Discussed options regarding blood sugar and weight loss. Patient to see which is preferred and let us know: -Ozempic -Trulicity -Victoza Discussed bariatric surgery. Fasting lab work today.  Continue your routine eye exams. Diet Goal -Cut out sugary drinks Exercise Goal -15 min walk on treadmill 4 days a week  Consider pneumonia vaccine due to diabetes. Defers today.  Return in about 3 months (around 03/07/2020). Sooner depending on labs.

## 2019-12-06 NOTE — Progress Notes (Signed)
   Subjective:    Patient ID: Brianna [name], female    DOB: Jul 23, 1991, 29 y.o.   MRN: [id number]  Diabetes She presents for her follow-up diabetic visit. She has type 2 diabetes mellitus. There are no hypoglycemic associated symptoms. There are no diabetic associated symptoms. There are no hypoglycemic complications. There are no diabetic complications. She is compliant with treatment all of the time. She does not see a podiatrist.Eye exam is current.      Review of Systems     Objective:   Physical Exam        Assessment & Plan:

## 2019-12-06 NOTE — Patient Instructions (Signed)
Call your insurance to see which medication is covered for your blood sugar and weight loss -Ozempic -Trulicity -Victoza  Fasting lab work today.  Diet Goal? -Cut out sugary drinks Exercise Goal? -15 min walk on treadmill 4 days a week

## 2019-12-07 LAB — CBC WITH DIFFERENTIAL/PLATELET
Basophils Absolute: 0 10*3/uL (ref 0.0–0.2)
Basos: 1 %
EOS (ABSOLUTE): 0.1 10*3/uL (ref 0.0–0.4)
Eos: 2 %
Hematocrit: 35.5 % (ref 34.0–46.6)
Hemoglobin: 11.4 g/dL (ref 11.1–15.9)
Immature Grans (Abs): 0 10*3/uL (ref 0.0–0.1)
Immature Granulocytes: 0 %
Lymphocytes Absolute: 1.4 10*3/uL (ref 0.7–3.1)
Lymphs: 22 %
MCH: 27.5 pg (ref 26.6–33.0)
MCHC: 32.1 g/dL (ref 31.5–35.7)
MCV: 86 fL (ref 79–97)
Monocytes Absolute: 0.4 10*3/uL (ref 0.1–0.9)
Monocytes: 6 %
Neutrophils Absolute: 4.5 10*3/uL (ref 1.4–7.0)
Neutrophils: 69 %
Platelets: 268 10*3/uL (ref 150–450)
RBC: 4.14 x10E6/uL (ref 3.77–5.28)
RDW: 13.4 % (ref 11.7–15.4)
WBC: 6.5 10*3/uL (ref 3.4–10.8)

## 2019-12-07 LAB — HEMOGLOBIN A1C
Est. average glucose Bld gHb Est-mCnc: 246 mg/dL
Hgb A1c MFr Bld: 10.2 % — ABNORMAL HIGH (ref 4.8–5.6)

## 2019-12-07 LAB — BASIC METABOLIC PANEL
BUN/Creatinine Ratio: 20 (ref 9–23)
BUN: 11 mg/dL (ref 6–20)
CO2: 23 mmol/L (ref 20–29)
Calcium: 9.9 mg/dL (ref 8.7–10.2)
Chloride: 100 mmol/L (ref 96–106)
Creatinine, Ser: 0.55 mg/dL — ABNORMAL LOW (ref 0.57–1.00)
GFR calc Af Amer: 148 mL/min/{1.73_m2} (ref >59)
GFR calc non Af Amer: 128 mL/min/{1.73_m2} (ref >59)
Glucose: 257 mg/dL — ABNORMAL HIGH (ref 65–99)
Potassium: 4.7 mmol/L (ref 3.5–5.2)
Sodium: 136 mmol/L (ref 134–144)

## 2019-12-07 LAB — VITAMIN D 25 HYDROXY (VIT D DEFICIENCY, FRACTURES): Vit D, 25-Hydroxy: 78.7 ng/mL (ref 30.0–100.0)

## 2019-12-07 LAB — MICROALBUMIN / CREATININE URINE RATIO
Creatinine, Urine: 111.3 mg/dL
Microalb/Creat Ratio: 37 mg/g creat — ABNORMAL HIGH (ref 0–29)
Microalbumin, Urine: 41.5 ug/mL

## 2019-12-07 LAB — TSH: TSH: 5.68 u[IU]/mL — ABNORMAL HIGH (ref 0.450–4.500)

## 2019-12-08 ENCOUNTER — Encounter: Payer: Self-pay | Admitting: Nurse Practitioner

## 2019-12-09 ENCOUNTER — Other Ambulatory Visit: Payer: Self-pay | Admitting: Nurse Practitioner

## 2019-12-09 ENCOUNTER — Encounter: Payer: Self-pay | Admitting: Nurse Practitioner

## 2019-12-09 DIAGNOSIS — E039 Hypothyroidism, unspecified: Secondary | ICD-10-CM

## 2019-12-09 MED ORDER — LEVOTHYROXINE SODIUM 25 MCG PO TABS: 25.0000 ug | ORAL_TABLET | Freq: Every day | ORAL | 2 refills | Status: DC

## 2019-12-09 MED ORDER — OZEMPIC (0.25 OR 0.5 MG/DOSE) 2 MG/1.5ML ~~LOC~~ SOPN: 0.2500 mg | PEN_INJECTOR | SUBCUTANEOUS | 2 refills | Status: DC

## 2019-12-10 LAB — CREATINE: Creatine, Serum: 0.7 mg/dL (ref 0.1–1.0)

## 2019-12-18 ENCOUNTER — Encounter: Payer: Self-pay | Admitting: Nurse Practitioner

## 2020-01-02 ENCOUNTER — Other Ambulatory Visit: Payer: Self-pay | Admitting: Family Medicine

## 2020-01-05 ENCOUNTER — Other Ambulatory Visit: Payer: Self-pay | Admitting: Family Medicine

## 2020-03-03 ENCOUNTER — Other Ambulatory Visit: Payer: Self-pay | Admitting: Nurse Practitioner

## 2020-03-13 ENCOUNTER — Ambulatory Visit: Payer: BC Managed Care – PPO | Admitting: Nurse Practitioner

## 2020-03-29 ENCOUNTER — Other Ambulatory Visit: Payer: Self-pay | Admitting: Nurse Practitioner

## 2020-04-02 ENCOUNTER — Other Ambulatory Visit: Payer: Self-pay | Admitting: *Deleted

## 2020-04-02 MED ORDER — ENALAPRIL MALEATE 20 MG PO TABS: 20.0000 mg | ORAL_TABLET | Freq: Every day | ORAL | 0 refills | Status: DC

## 2020-04-07 ENCOUNTER — Other Ambulatory Visit: Payer: Self-pay | Admitting: Nurse Practitioner

## 2020-04-10 ENCOUNTER — Ambulatory Visit: Payer: BC Managed Care – PPO | Admitting: Nurse Practitioner

## 2020-04-30 ENCOUNTER — Other Ambulatory Visit: Payer: Self-pay | Admitting: Nurse Practitioner

## 2020-05-01 ENCOUNTER — Encounter: Payer: Self-pay | Admitting: *Deleted

## 2020-05-01 NOTE — Telephone Encounter (Signed)
Will send in 30 day supply. She needs repeat labs to see if the med is working. Thanks. (labs are already in the system)

## 2020-05-15 ENCOUNTER — Other Ambulatory Visit: Payer: Self-pay | Admitting: Nurse Practitioner

## 2020-05-18 NOTE — Telephone Encounter (Signed)
Sent my chart message to schedule appointment.

## 2020-05-22 ENCOUNTER — Other Ambulatory Visit: Payer: Self-pay

## 2020-05-22 ENCOUNTER — Ambulatory Visit (INDEPENDENT_AMBULATORY_CARE_PROVIDER_SITE_OTHER): Payer: BC Managed Care – PPO | Admitting: Nurse Practitioner

## 2020-05-22 ENCOUNTER — Encounter: Payer: Self-pay | Admitting: Nurse Practitioner

## 2020-05-22 VITALS — BP 160/108 | HR 108 | Temp 98.1°F | Wt 302.8 lb

## 2020-05-22 DIAGNOSIS — E1169 Type 2 diabetes mellitus with other specified complication: Secondary | ICD-10-CM

## 2020-05-22 DIAGNOSIS — I10 Essential (primary) hypertension: Secondary | ICD-10-CM | POA: Diagnosis not present

## 2020-05-22 DIAGNOSIS — Z23 Encounter for immunization: Secondary | ICD-10-CM | POA: Diagnosis not present

## 2020-05-22 DIAGNOSIS — E039 Hypothyroidism, unspecified: Secondary | ICD-10-CM | POA: Diagnosis not present

## 2020-05-22 DIAGNOSIS — E669 Obesity, unspecified: Secondary | ICD-10-CM

## 2020-05-22 MED ORDER — GLIPIZIDE ER 10 MG PO TB24: 10.0000 mg | ORAL_TABLET | Freq: Every day | ORAL | 0 refills | Status: DC

## 2020-05-22 NOTE — Progress Notes (Signed)
Subjective:    Patient ID: Brianna [name], female    DOB: [birth year], 29 y.o.   MRN: [id number]  Hypertension This is a chronic problem. Pertinent negatives include no chest pain, headaches, palpitations or shortness of breath. Treatments tried: enalapril.   Hypothyroidism- Taking Synthroid daily, reports no issues.  Diabetes- started Ozempic in May, pt reports taking it for 1 month and stopping. Reports mild diarrhea while taking but no other issues. Reports no definitive reason for stopping. She is taking Metformin and glipizide as directed, no issues with this. She is not taking her blood sugars at home, last CBG was a month ago, she reports it was in the 300's.   Review of Systems  Constitutional: Negative for fatigue and fever.  HENT: Negative for sore throat and trouble swallowing.   Respiratory: Negative for cough, chest tightness, shortness of breath and wheezing.   Cardiovascular: Negative for chest pain, palpitations and leg swelling.  Gastrointestinal: Negative for abdominal distention, abdominal pain, constipation, diarrhea, nausea and vomiting.  Neurological: Negative for dizziness, tremors, syncope, weakness, light-headedness and headaches.  Psychiatric/Behavioral: Negative for self-injury, sleep disturbance and suicidal ideas. The patient is not nervous/anxious.      Office Visit from 05/22/2020 in [facility]  PHQ-9 Total Score 3     24 Hr diet recall: B: chicken salad and crackers. L: Pork chop, rice, and turnip greens. D: Fried chicken, potatoes, turnip greens, and a brownie.     Objective:   Physical Exam CV: Normal heart sounds without murmur gallop, or bruit. Regular rate and rhythm. Skin is warm and dry, no lower extremity edema.  Pulm: breath sounds equal and clear bilaterally without wheezing or rhonchi  Neck: Thyroid without mass, tenderness, or swelling. No cervical adenopathy noted.    Today's Vitals   05/22/20 0913  BP: (!) 160/108    Pulse: (!) 108  Temp: 98.1 F (36.7 C)  SpO2: 99%  Weight: (!) 137.3 kg   Body mass index is 48.87 kg/m.     Assessment & Plan:   Problem List Items Addressed This Visit      Endocrine   Diabetes mellitus type 2 in obese (HCC) - Primary   Relevant Medications   glipiZIDE (GLIPIZIDE XL) 10 MG 24 hr tablet   Other Relevant Orders   Hemoglobin A1c   Hypothyroidism   Relevant Orders   TSH   T4, free    Other Visit Diagnoses    Essential hypertension       Relevant Orders   Comprehensive Metabolic Panel (CMET)   Need for vaccination       Relevant Orders   Flu Vaccine QUAD 6+ mos PF IM (Fluarix Quad PF) (Completed)     Meds ordered this encounter  Medications  . glipiZIDE (GLIPIZIDE XL) 10 MG 24 hr tablet    Sig: Take 1 tablet (10 mg total) by mouth daily with breakfast.    Dispense:  30 tablet    Refill:  0    Order Specific Question:   Supervising Provider    Answer:   Lilyan Punt A [[id number]]    Instructed patient to take 2 tablets of the 5 mg glipizide daily to be a total of 10 mg/day. Prescription sent for 10 mg tablets, but use the remainder of your 5 mg until then.   Recommend monitoring BP outside the office. Freestyle libre applied and instructions given.  Encouraged healthy diet with moderation in carbohydrates and starchy vegetables. Decrease sugary drink intake, and aim  for 30 minutes of physical activity daily.  Return in about 3 months (around 08/22/2020) for Diabetes check up.

## 2020-05-22 NOTE — Patient Instructions (Addendum)
Take 2 tablets of the 5 mg glipizide daily to be a total of 10 mg/day. We will send in a prescription for 10 mg tablets, but use the remainder of your 5 mg until then.

## 2020-05-22 NOTE — Telephone Encounter (Signed)
Patient had appointment today.

## 2020-05-23 LAB — COMPREHENSIVE METABOLIC PANEL
ALT: 25 IU/L (ref 0–32)
AST: 18 IU/L (ref 0–40)
Albumin/Globulin Ratio: 1.5 (ref 1.2–2.2)
Albumin: 4.5 g/dL (ref 3.9–5.0)
Alkaline Phosphatase: 72 IU/L (ref 44–121)
BUN/Creatinine Ratio: 18 (ref 9–23)
BUN: 9 mg/dL (ref 6–20)
Bilirubin Total: 0.3 mg/dL (ref 0.0–1.2)
CO2: 22 mmol/L (ref 20–29)
Calcium: 9.4 mg/dL (ref 8.7–10.2)
Chloride: 99 mmol/L (ref 96–106)
Creatinine, Ser: 0.49 mg/dL — ABNORMAL LOW (ref 0.57–1.00)
GFR calc Af Amer: 153 mL/min/{1.73_m2} (ref >59)
GFR calc non Af Amer: 133 mL/min/{1.73_m2} (ref >59)
Globulin, Total: 3 g/dL (ref 1.5–4.5)
Glucose: 283 mg/dL — ABNORMAL HIGH (ref 65–99)
Potassium: 4.5 mmol/L (ref 3.5–5.2)
Sodium: 134 mmol/L (ref 134–144)
Total Protein: 7.5 g/dL (ref 6.0–8.5)

## 2020-05-23 LAB — T4, FREE: Free T4: 1.28 ng/dL (ref 0.82–1.77)

## 2020-05-23 LAB — HEMOGLOBIN A1C
Est. average glucose Bld gHb Est-mCnc: 275 mg/dL
Hgb A1c MFr Bld: 11.2 % — ABNORMAL HIGH (ref 4.8–5.6)

## 2020-05-23 LAB — TSH: TSH: 3.43 u[IU]/mL (ref 0.450–4.500)

## 2020-05-25 ENCOUNTER — Encounter: Payer: Self-pay | Admitting: Nurse Practitioner

## 2020-05-25 NOTE — Progress Notes (Signed)
   Subjective:    Patient ID: Brianna [name], female    DOB: Feb 04, 1991, 29 y.o.   MRN: [id number]  HPI    Review of Systems     Objective:   Physical Exam        Assessment & Plan:

## 2020-05-27 ENCOUNTER — Telehealth: Payer: Self-pay | Admitting: *Deleted

## 2020-05-27 NOTE — Telephone Encounter (Signed)
Pt returned call. Pt states she will not be able to come to office visit due to co pay. Please advise. Thank you

## 2020-05-27 NOTE — Telephone Encounter (Signed)
Lm for pt to return call.  Campbell Riches, NP  Brittin Janik, Shara Blazing, LPN Recommend office visit in 2 weeks to recheck BP. Also ask her to bring her glucose log to see if new dose of medicine is working. Thanks.

## 2020-05-29 ENCOUNTER — Telehealth: Payer: Self-pay | Admitting: Nurse Practitioner

## 2020-05-29 NOTE — Telephone Encounter (Signed)
Patient is being contacted by phone today to review her recent labs and develop a plan.

## 2020-06-16 ENCOUNTER — Other Ambulatory Visit: Payer: Self-pay | Admitting: Nurse Practitioner

## 2020-06-29 ENCOUNTER — Other Ambulatory Visit: Payer: Self-pay | Admitting: Nurse Practitioner

## 2020-06-30 ENCOUNTER — Other Ambulatory Visit: Payer: Self-pay | Admitting: Family Medicine

## 2020-07-02 ENCOUNTER — Other Ambulatory Visit: Payer: Self-pay | Admitting: Nurse Practitioner

## 2020-08-21 ENCOUNTER — Ambulatory Visit (INDEPENDENT_AMBULATORY_CARE_PROVIDER_SITE_OTHER): Payer: BC Managed Care – PPO | Admitting: Nurse Practitioner

## 2020-08-21 ENCOUNTER — Other Ambulatory Visit: Payer: Self-pay

## 2020-08-21 ENCOUNTER — Encounter: Payer: Self-pay | Admitting: Nurse Practitioner

## 2020-08-21 VITALS — BP 118/92 | HR 107 | Temp 97.4°F | Ht 66.0 in | Wt 291.4 lb

## 2020-08-21 DIAGNOSIS — Z309 Encounter for contraceptive management, unspecified: Secondary | ICD-10-CM

## 2020-08-21 DIAGNOSIS — Z124 Encounter for screening for malignant neoplasm of cervix: Secondary | ICD-10-CM

## 2020-08-21 DIAGNOSIS — Z01419 Encounter for gynecological examination (general) (routine) without abnormal findings: Secondary | ICD-10-CM

## 2020-08-21 DIAGNOSIS — Z113 Encounter for screening for infections with a predominantly sexual mode of transmission: Secondary | ICD-10-CM

## 2020-08-21 DIAGNOSIS — Z1151 Encounter for screening for human papillomavirus (HPV): Secondary | ICD-10-CM

## 2020-08-21 DIAGNOSIS — N912 Amenorrhea, unspecified: Secondary | ICD-10-CM | POA: Diagnosis not present

## 2020-08-21 DIAGNOSIS — E669 Obesity, unspecified: Secondary | ICD-10-CM

## 2020-08-21 DIAGNOSIS — E785 Hyperlipidemia, unspecified: Secondary | ICD-10-CM

## 2020-08-21 DIAGNOSIS — E1169 Type 2 diabetes mellitus with other specified complication: Secondary | ICD-10-CM

## 2020-08-21 MED ORDER — MEDROXYPROGESTERONE ACETATE 150 MG/ML IM SUSP
150.0000 mg | Freq: Once | INTRAMUSCULAR | Status: AC
  Administered 2020-08-21: 150 mg via INTRAMUSCULAR

## 2020-08-21 NOTE — Progress Notes (Deleted)
Acute Office Visit  Subjective:    Patient ID: Brianna [name], female    DOB: February 28, 1991, 30 y.o.   MRN: [id number]  Chief Complaint  Patient presents with  . Annual Exam    HPI Patient is in today for her annual physical.  She has lost 11 lbs.  Been eating a keto diet for a couple of weeks.  She walks approximately 30 min a day when the weather is good.  She occasionally checks her fasting blood sugar in the morning.    Reports it is typically just over 100, sometimes 200 depending what she eats the night before.  A1C from 07/29/20 is 5.7. Blood pressure 118/92; she has a bp cuff at home but does not typically check it.   Continues employment at the [employer].  She is requesting a prescription for birth control today.   Review of Systems  Constitutional:       Denies unexpected weight change, diaphoresis, appetite change.   Eyes:       Denies vision changes.  Had eye exam done in November 2021.  Respiratory:       Denies cough, SOB, dyspnea, chest tightness.  Cardiovascular:       Denies leg swelling, chest pain.  Gastrointestinal:       Denies abdominal pain, constipation, rectal bleeding, nausea/vomiting. Reports diarrhea approx 1x/week related to eating greasy food.   Endocrine:       Denies cold or heat intolerance, excessive thirst or appetite, excessive voiding.   Genitourinary:       Denies dysuria, urgency, frequency, genital sores, vaginal discharge, vaginal pain, issues with menstruation.  Reports last cycle was on 07/30/20. Reports 2 new sexual partners, uses condoms the majority of time.  Musculoskeletal:       Denies arthralgias, joint swelling, back pain.  Skin:       Denies rashes, wounds.  Allergic/Immunologic:       Denies allergies.  Neurological:       Denies numbness or tingling, headaches, tremors, light-headedness, dizziness, weakness.  Hematological:       Denies bruising easily.  Psychiatric/Behavioral:       Denies depression or anxiousness, sleep  disturbance.         Objective:    Physical Exam Constitutional:      Comments: Obese, alert.   Cardiovascular:     Comments: Heart rate regular rate and rhythm, s1, s2. Pulmonary:     Comments: No rales, wheezing, chest tenderness.Normal respiratory effort. Chest:     Comments: 1cm x 1cm sebaceous cyst in lower middle R breast at approximately midclavicular line. No nipple discharge, skin changes, lumps or masses noted.  Abdominal:     Comments: No abdominal tenderness.  Genitourinary:    Comments: No vaginal discharge, no lesions, cervix pink and moist.  Musculoskeletal:     Comments: No edema present.  Skin:    Comments: Skin warm and dry.  No rashes, erythema, lesions.  Neurological:     Comments: Oriented x4.  Psychiatric:     Comments: Mood, behavior, thought content normal.     BP (!) 118/92   Pulse (!) 107   Temp (!) 97.4 F (36.3 C)   Ht 5\' 6"  (1.676 m)   Wt 291 lb 6.4 oz (132.2 kg)   SpO2 99%   BMI 47.03 kg/m  Wt Readings from Last 3 Encounters:  08/21/20 291 lb 6.4 oz (132.2 kg)  05/22/20 (!) 302 lb 12.8 oz (137.3 kg)  12/06/19 12/08/19)  310 lb 12.8 oz (141 kg)      Assessment & Plan:   Problem List Items Addressed This Visit   None   Visit Diagnoses    Amenorrhea    -  Primary   Relevant Orders   POCT urine pregnancy   Well woman exam       Relevant Orders   IGP, Aptima HPV   Screening for cervical cancer       Relevant Orders   IGP, Aptima HPV   Screening for HPV (human papillomavirus)       Relevant Orders   IGP, Aptima HPV   Screen for STD (sexually transmitted disease)       Relevant Orders   Chlamydia/Gonococcus/Trichomonas, NAA   Encounter for contraceptive management, unspecified type       Relevant Medications   medroxyPROGESTERone (DEPO-PROVERA) injection 150 mg (Completed)       Meds ordered this encounter  Medications  . medroxyPROGESTERone (DEPO-PROVERA) injection 150 mg   Patient to follow-up for depo-provera in 3  months. Will also check A1C and Lipid panel.  Encouraged to continue with physical activity and monitoring diet.   Antonietta Barcelona, RN

## 2020-08-21 NOTE — Patient Instructions (Signed)
Medroxyprogesterone injection [Contraceptive] What is this medicine? MEDROXYPROGESTERONE (me DROX ee proe JES te rone) contraceptive injections prevent pregnancy. They provide effective birth control for 3 months. Depo-SubQ Provera 104 injection is also used for treating pain related to endometriosis. This medicine may be used for other purposes; ask your health care provider or pharmacist if you have questions. COMMON BRAND NAME(S): Depo-Provera, Depo-subQ Provera 104 What should I tell my health care provider before I take this medicine? They need to know if you have any of these conditions:  asthma  blood clots  breast cancer or family history of breast cancer  depression  diabetes  eating disorder (anorexia nervosa)  heart attack  high blood pressure  HIV infection or AIDS  if you often drink alcohol  kidney disease  liver disease  migraine headaches  osteoporosis, weak bones  seizures  stroke  tobacco smoker  vaginal bleeding  an unusual or allergic reaction to medroxyprogesterone, other hormones, medicines, foods, dyes, or preservatives  pregnant or trying to get pregnant  breast-feeding How should I use this medicine? Depo-Provera CI contraceptive injection is given into a muscle. Depo-subQ Provera 104 injection is given under the skin. It is given by a health care provider in a hospital or clinic setting. The injection is usually given during the first 5 days after the start of a menstrual period or 6 weeks after delivery of a baby. A patient package insert for the product will be given with each prescription and refill. Be sure to read this information carefully each time. The sheet may change often. Talk to your pediatrician regarding the use of this medicine in children. Special care may be needed. These injections have been used in female children who have started having menstrual periods. Overdosage: If you think you have taken too much of this  medicine contact a poison control center or emergency room at once. NOTE: This medicine is only for you. Do not share this medicine with others. What if I miss a dose? Keep appointments for follow-up doses. You must get an injection once every 3 months. It is important not to miss your dose. Call your health care provider if you are unable to keep an appointment. What may interact with this medicine?  antibiotics or medicines for infections, especially rifampin and griseofulvin  antivirals for HIV or hepatitis  aprepitant  armodafinil  bexarotene  bosentan  medicines for seizures like carbamazepine, felbamate, oxcarbazepine, phenytoin, phenobarbital, primidone, topiramate  mitotane  modafinil  St. John's wort This list may not describe all possible interactions. Give your health care provider a list of all the medicines, herbs, non-prescription drugs, or dietary supplements you use. Also tell them if you smoke, drink alcohol, or use illegal drugs. Some items may interact with your medicine. What should I watch for while using this medicine? This drug does not protect you against HIV infection (AIDS) or other sexually transmitted diseases. Use of this product may cause you to lose calcium from your bones. Loss of calcium may cause weak bones (osteoporosis). Only use this product for more than 2 years if other forms of birth control are not right for you. The longer you use this product for birth control the more likely you will be at risk for weak bones. Ask your health care professional how you can keep strong bones. You may have a change in bleeding pattern or irregular periods. Many females stop having periods while taking this drug. If you have received your injections on time, your   chance of being pregnant is very low. If you think you may be pregnant, see your health care professional as soon as possible. Tell your health care professional if you want to get pregnant within the  next year. The effect of this medicine may last a long time after you get your last injection. What side effects may I notice from receiving this medicine? Side effects that you should report to your doctor or health care professional as soon as possible:  allergic reactions like skin rash, itching or hives, swelling of the face, lips, or tongue  blood clot (chest pain; shortness of breath; pain, swelling, or warmth in the leg)  breast tenderness or discharge  changes in emotions or moods  changes in vision  liver injury (dark yellow or brown urine; general ill feeling or flu-like symptoms; loss of appetite, right upper belly pain; unusually weak or tired, yellowing of the eyes or skin)  persistent pain, pus, or bleeding at the injection site  stroke (changes in vision; confusion; trouble speaking or understanding; severe headaches; sudden numbness or weakness of the face, arm or leg; trouble walking; dizziness; loss of balance or coordination)  trouble breathing Side effects that usually do not require medical attention (report to your doctor or health care professional if they continue or are bothersome):  change in sex drive  dizziness  fluid retention  headache  irregular periods, spotting, or absent periods  pain, redness, or irritation at site where injected  stomach pain  weight gain This list may not describe all possible side effects. Call your doctor for medical advice about side effects. You may report side effects to FDA at 1-800-FDA-1088. Where should I keep my medicine? This injection is only given by a health care provider. It will not be stored at home. NOTE: This sheet is a summary. It may not cover all possible information. If you have questions about this medicine, talk to your doctor, pharmacist, or health care provider.  2021 Elsevier/Gold Standard (2019-08-28 10:29:21)  

## 2020-08-21 NOTE — Progress Notes (Signed)
   Subjective:    Patient ID: Brianna [name], female    DOB: 1991-07-15, 30 y.o.   MRN: [id number]  HPI The patient comes in today for a wellness visit.    A review of their health history was completed.  A review of medications was also completed.  Eating habits: good  Falls/  MVA accidents in past few months: none  Regular exercise: walking daily  Specialist pt sees on regular basis: none  Preventative health issues were discussed.   Additional concerns: Patient interested in starting birth control.   Review of Systems     Objective:   Physical Exam        Assessment & Plan:

## 2020-08-21 NOTE — Progress Notes (Addendum)
Subjective:    Patient ID: Brianna [name], female    DOB: May 17, 1991, 30 y.o.   MRN: [id number] HPI Patient is in today for her annual physical.  She has lost 11 lbs.  Been eating a low carb, keto diet for a couple of weeks.  She walks approximately 30 min a day when the weather is good.  She occasionally checks her fasting blood sugar in the morning.    Reports it is typically just over 100, sometimes 200 depending what she eats the night before.  She has a bp cuff at home but does not typically check it.   Continues employment at  [employer].  She is requesting a prescription for birth control today.   Review of Systems  Constitutional:       Denies unexpected weight change, diaphoresis, appetite change.   Eyes:       Denies vision changes.  Had eye exam done in November 2021.  Respiratory:       Denies cough, SOB, dyspnea, chest tightness.  Cardiovascular:       Denies leg swelling, chest pain.  Gastrointestinal:       Denies abdominal pain, constipation, rectal bleeding, nausea/vomiting. Reports diarrhea approx 1x/week related to eating greasy food.   Endocrine:       Denies cold or heat intolerance, excessive thirst or appetite, excessive voiding.   Genitourinary:       Denies dysuria, urgency, frequency, genital sores, vaginal discharge, vaginal pain, issues with menstruation.  Reports last cycle was on 07/30/20. Reports 2 new sexual partners over the past year, uses condoms the majority of time.  Musculoskeletal:       Denies arthralgias, joint swelling, back pain.  Skin:       Denies rashes, wounds.  Allergic/Immunologic:       Denies allergies.  Neurological:       Denies numbness or tingling, headaches, tremors, light-headedness, dizziness, weakness.  Hematological:       Denies bruising easily.  Psychiatric/Behavioral:       Denies depression or anxiousness, sleep disturbance.      Objective:    Physical Exam Constitutional:      Comments: NAD. Alert, oriented.    Cardiovascular:     Comments: Heart rate regular rate and rhythm. No murmur.  Pulmonary:     Comments: Lungs clear. No rales, wheezing. Normal respiratory effort. Chest:     Comments: 1cm x 1cm non tender, mobile, sebaceous cyst in lower middle R breast at approx 6 o'clock. Patient is chronic lesion with no changes. No masses, skin changes or tenderness noted. Axillae: no adenopathy.  Abdominal:     Comments: Soft, non distended. Non tender.  Genitourinary:    Comments: External GU: no rashes or lesions. No vaginal discharge, no lesions, cervix pink and moist. No lesions. Bimanual exam: no tenderness or obvious masses; exam limited due to abdominal girth.  Musculoskeletal:     Comments: No edema present.  Skin:    Comments: Skin warm and dry.  No rashes, erythema, lesions.  Neurological:     Comments: Oriented x4.  Psychiatric:     Comments: Mood, behavior, thought content normal.     BP (!) 118/92   Pulse (!) 107   Temp (!) 97.4 F (36.3 C)   Ht 5\' 6"  (1.676 m)   Wt 291 lb 6.4 oz (132.2 kg)   SpO2 99%   BMI 47.03 kg/m  Wt Readings from Last 3 Encounters:  08/21/20 291 lb 6.4  oz (132.2 kg)  05/22/20 (!) 302 lb 12.8 oz (137.3 kg)  12/06/19 (!) 310 lb 12.8 oz (141 kg)   Diabetic Foot Exam - Simple   Simple Foot Form Visual Inspection No deformities, no ulcerations, no other skin breakdown bilaterally: Yes Sensation Testing Intact to touch and monofilament testing bilaterally: Yes Pulse Check Posterior Tibialis and Dorsalis pulse intact bilaterally: Yes Comments Skin warm and dry. Cap refill <3 seconds.    Results for orders placed or performed in visit on 08/21/20  POCT urine pregnancy  Result Value Ref Range   Preg Test, Ur Negative Negative      Assessment & Plan:   Problem List Items Addressed This Visit   None   Visit Diagnoses    Amenorrhea    -  Primary   Relevant Orders   POCT urine pregnancy   Well woman exam       Relevant Orders   IGP, Aptima  HPV   Screening for cervical cancer       Relevant Orders   IGP, Aptima HPV   Screening for HPV (human papillomavirus)       Relevant Orders   IGP, Aptima HPV   Screen for STD (sexually transmitted disease)       Relevant Orders   Chlamydia/Gonococcus/Trichomonas, NAA   Encounter for contraceptive management, unspecified type       Relevant Medications   medroxyPROGESTERone (DEPO-PROVERA) injection 150 mg (Completed)       Meds ordered this encounter  Medications  . medroxyPROGESTERone (DEPO-PROVERA) injection 150 mg   Discussed different methods of contraceptives with patient including oral, injection, Nexplanon and IUD. Adverse effects discussed with patient, patient to call if she has any prolonged or heavy bleeding.  Handout given to patient. Encouraged safe sex practices and use of condoms to prevent STIs. Patient to follow-up for depo-provera in 3 months. Will also check A1C and Lipid panel.  Encouraged to continue with physical activity and diet changes to promote healthy lifestyle.   Patient declined HIV screening, HEP C screening, Pneumovax, and TDAP at this visit.  Return in about 3 months (around 11/19/2020) for depo provera, recheck with labs.   Antonietta Barcelona, RN

## 2020-08-22 ENCOUNTER — Encounter: Payer: Self-pay | Admitting: Nurse Practitioner

## 2020-08-22 LAB — POCT URINE PREGNANCY: Preg Test, Ur: NEGATIVE

## 2020-08-23 LAB — CHLAMYDIA/GONOCOCCUS/TRICHOMONAS, NAA
Chlamydia by NAA: NEGATIVE
Gonococcus by NAA: NEGATIVE
Trich vag by NAA: NEGATIVE

## 2020-08-23 LAB — SPECIMEN STATUS REPORT

## 2020-08-26 LAB — IGP, APTIMA HPV: HPV Aptima: NEGATIVE

## 2020-09-07 ENCOUNTER — Other Ambulatory Visit: Payer: Self-pay | Admitting: Nurse Practitioner

## 2020-09-09 MED ORDER — GLIPIZIDE ER 10 MG PO TB24: 10.0000 mg | ORAL_TABLET | Freq: Every day | ORAL | 0 refills | Status: DC

## 2020-10-08 ENCOUNTER — Encounter: Payer: Self-pay | Admitting: Nurse Practitioner

## 2020-10-14 ENCOUNTER — Telehealth (INDEPENDENT_AMBULATORY_CARE_PROVIDER_SITE_OTHER): Payer: BC Managed Care – PPO | Admitting: Family Medicine

## 2020-10-14 ENCOUNTER — Telehealth: Payer: Self-pay | Admitting: *Deleted

## 2020-10-14 DIAGNOSIS — N920 Excessive and frequent menstruation with regular cycle: Secondary | ICD-10-CM

## 2020-10-14 NOTE — Telephone Encounter (Signed)
Brianna [name], Brianna [name] are scheduled for a virtual visit with your provider today.    Just as we do with appointments in the office, we must obtain your consent to participate.  Your consent will be active for this visit and any virtual visit you may have with one of our providers in the next 365 days.    If you have a MyChart account, I can also send a copy of this consent to you electronically.  All virtual visits are billed to your insurance company just like a traditional visit in the office.  As this is a virtual visit, video technology does not allow for your provider to perform a traditional examination.  This may limit your provider's ability to fully assess your condition.  If your provider identifies any concerns that need to be evaluated in person or the need to arrange testing such as labs, EKG, etc, we will make arrangements to do so.    Although advances in technology are sophisticated, we cannot ensure that it will always work on either your end or our end.  If the connection with a video visit is poor, we may have to switch to a telephone visit.  With either a video or telephone visit, we are not always able to ensure that we have a secure connection.   I need to obtain your verbal consent now.   Are you willing to proceed with your visit today?   Brianna [name] has provided verbal consent on 10/14/2020 for a virtual visit (video or telephone).

## 2020-10-14 NOTE — Progress Notes (Signed)
Patient ID: Brianna [name], female    DOB: 1991-03-09, 30 y.o.   MRN: [id number]  Virtual Visit via Telephone Note  I connected with Brianna [name] on 10/14/20 at  2:10 PM EDT by telephone and verified that I am speaking with the correct person using two identifiers.  Location: Patient: home Provider: office   I discussed the limitations, risks, security and privacy concerns of performing an evaluation and management service by telephone and the availability of in person appointments. I also discussed with the patient that there may be a patient responsible charge related to this service. The patient expressed understanding and agreed to proceed.  Chief Complaint  Patient presents with  . Menorrhagia    Cycle has not stopped since starting Depo Provera in January   Subjective:    HPI  Pt is new to having depo provera injection, first one in 1/22.  Started this month with heavy bleeding.  Didn't have any bleeding last month.  Depo given in Jan'22.  Never had dep provera before or bcp.  Period and has had it for 13 days.  4-5 pad changes per day. Not getting lighter.  Taking ibuprofen.  Normally with period has some clotting even before the bleeding. No history of blood clotting disorder for her or family.  Due end of April for 2nd depo provera injection. Last hb in 5/21- normal 11.4.   No excessive pain. Taking ibuprofen    Medical History Brianna [name] has a past medical history of Diabetes mellitus without complication ([place]), Obesity, and Pain.   Outpatient Encounter Medications as of 10/14/2020  Medication Sig  . blood glucose meter kit and supplies KIT Dispense based on patient and insurance preference. Test blood sugar once daily. Diabetes type 2. E11.9  . enalapril (VASOTEC) 20 MG tablet TAKE 1 TABLET BY MOUTH EVERY DAY  . glipiZIDE (GLUCOTROL XL) 10 MG 24 hr tablet Take 1 tablet (10 mg total) by mouth daily with breakfast.  . Lancets (FREESTYLE) lancets Use as  instructed  . levothyroxine (SYNTHROID) 25 MCG tablet TAKE 1 TABLET BY MOUTH EVERY DAY BEFORE BREAKFAST  . metFORMIN (GLUCOPHAGE-XR) 750 MG 24 hr tablet TAKE 1 TABLET BY MOUTH EVERY DAY WITH BREAKFAST  . OVER THE COUNTER MEDICATION Vit d Vit c  elderberry  . [DISCONTINUED] ketoconazole (NIZORAL) 2 % cream Apply 1 application topically 2 (two) times daily. Affected area   No facility-administered encounter medications on file as of 10/14/2020.     Review of Systems  Constitutional: Negative for chills and fever.  HENT: Negative for congestion, rhinorrhea and sore throat.   Respiratory: Negative for cough, shortness of breath and wheezing.   Cardiovascular: Negative for chest pain and leg swelling.  Gastrointestinal: Negative for abdominal pain, diarrhea, nausea and vomiting.  Genitourinary: Positive for menstrual problem (heavy, prolonged bleeding). Negative for dysuria and frequency.  Musculoskeletal: Negative for arthralgias and back pain.  Skin: Negative for rash.  Neurological: Negative for dizziness, weakness and headaches.     Vitals There were no vitals taken for this visit.  Objective:   Physical Exam  No PE due to phone visit.  Assessment and Plan   1. Menorrhagia with regular cycle - Ambulatory referral to Gynecology    Pt to get hb check for evaluation of menorrhagia, pt to return to office this week for Hb check. Pt given referral to gyn for depo provera with prolonged and heavy bleeding.  Pt not wanting to continue with depo provera for birth control.  Pt to call if having severe pain or bleeding more than 1 pad per hour.  Cont with ibuprofen for pain prn.  Pt in agreement.  Return in about 2 days (around 10/16/2020) for f/u nurse visit -hb check.    Follow Up Instructions:    I discussed the assessment and treatment plan with the patient. The patient was provided an opportunity to ask questions and all were answered. The patient agreed with the plan  and demonstrated an understanding of the instructions.   The patient was advised to call back or seek an in-person evaluation if the symptoms worsen or if the condition fails to improve as anticipated.  I provided 15 minutes of non-face-to-face time during this encounter.

## 2020-10-16 ENCOUNTER — Telehealth: Payer: Self-pay | Admitting: *Deleted

## 2020-10-16 ENCOUNTER — Other Ambulatory Visit: Payer: Self-pay

## 2020-10-16 ENCOUNTER — Other Ambulatory Visit (INDEPENDENT_AMBULATORY_CARE_PROVIDER_SITE_OTHER): Payer: BC Managed Care – PPO | Admitting: *Deleted

## 2020-10-16 ENCOUNTER — Other Ambulatory Visit: Payer: Self-pay | Admitting: Nurse Practitioner

## 2020-10-16 ENCOUNTER — Encounter: Payer: Self-pay | Admitting: Nurse Practitioner

## 2020-10-16 DIAGNOSIS — N921 Excessive and frequent menstruation with irregular cycle: Secondary | ICD-10-CM

## 2020-10-16 LAB — POCT HEMOGLOBIN: Hemoglobin: 10.3 g/dL — AB (ref 11–14.6)

## 2020-10-16 MED ORDER — MEGESTROL ACETATE 40 MG PO TABS: ORAL_TABLET | ORAL | 0 refills | Status: DC

## 2020-10-16 NOTE — Telephone Encounter (Signed)
Pt comes in for a nurse visit today to have hemoglobin checked. It was 10.3. had virtual visit with dr taylor 2 days ago and dr taylor not in today to review hemoglobin. pt states she has been on her cycle now for 15 days.  Consult with carolyn.

## 2020-10-16 NOTE — Progress Notes (Unsigned)
Pt comes in for a nurse visit today to have hemoglobin checked. It was 10.3. had virtual visit with dr taylor 2 days ago and dr taylor not in today to review hemoglobin. pt states she has been on her cycle now for 15 days.  Consult with carolyn.  

## 2020-10-16 NOTE — Progress Notes (Deleted)
   Subjective:    Patient ID: Brianna [name], female    DOB: Aug 19, 1990, 30 y.o.   MRN: [id number]  HPI pt arrives for visit to check hemoglobin due to heavy cycles. Has been on cycle for 15 days now. Hb today 10.3.     Review of Systems     Objective:   Physical Exam        Assessment & Plan:

## 2020-10-16 NOTE — Telephone Encounter (Signed)
See my chart message and lab result.

## 2020-11-07 ENCOUNTER — Other Ambulatory Visit: Payer: Self-pay | Admitting: Nurse Practitioner

## 2020-11-09 ENCOUNTER — Encounter: Payer: Self-pay | Admitting: Family Medicine

## 2020-11-09 ENCOUNTER — Ambulatory Visit: Payer: BC Managed Care – PPO | Admitting: Family Medicine

## 2020-11-09 ENCOUNTER — Other Ambulatory Visit: Payer: Self-pay

## 2020-11-09 VITALS — BP 128/84 | HR 89 | Temp 97.3°F | Wt 276.4 lb

## 2020-11-09 DIAGNOSIS — N898 Other specified noninflammatory disorders of vagina: Secondary | ICD-10-CM | POA: Diagnosis not present

## 2020-11-09 DIAGNOSIS — Z113 Encounter for screening for infections with a predominantly sexual mode of transmission: Secondary | ICD-10-CM | POA: Diagnosis not present

## 2020-11-09 NOTE — Progress Notes (Signed)
Patient ID: Brianna [name], female    DOB: 12-03-1990, 30 y.o.   MRN: [id number]   Chief Complaint  Patient presents with  . itching in private area-? yeast infection   Subjective:  Cc:  Vaginal itching  This is a new problem.  Presents today with a complaint of vaginal itching.  Reports that symptoms have been present for 1 week, has not had any itching today.  Denies any vaginal discharge.  Reports that she had a new sexual partner last week, did not wear a condom.  Symptoms started after that.  Denies fever, chills, chest pain, shortness of breath.  Endorses vaginal itching which has resolved today.    Medical History Brianna [name] has a past medical history of Diabetes mellitus without complication ([place]), Obesity, and Pain.   Outpatient Encounter Medications as of 11/09/2020  Medication Sig  . blood glucose meter kit and supplies KIT Dispense based on patient and insurance preference. Test blood sugar once daily. Diabetes type 2. E11.9  . enalapril (VASOTEC) 20 MG tablet TAKE 1 TABLET BY MOUTH EVERY DAY  . glipiZIDE (GLUCOTROL XL) 10 MG 24 hr tablet Take 1 tablet (10 mg total) by mouth daily with breakfast.  . Lancets (FREESTYLE) lancets Use as instructed  . levothyroxine (SYNTHROID) 25 MCG tablet TAKE 1 TABLET BY MOUTH EVERY DAY BEFORE BREAKFAST  . megestrol (MEGACE) 40 MG tablet Take 3 tabs po qd x 5 d then 2 qd x 5 d then one qd  . metFORMIN (GLUCOPHAGE-XR) 750 MG 24 hr tablet TAKE 1 TABLET BY MOUTH EVERY DAY WITH BREAKFAST  . OVER THE COUNTER MEDICATION Vit d Vit c  elderberry   No facility-administered encounter medications on file as of 11/09/2020.     Review of Systems  Constitutional: Negative for chills and fever.  Respiratory: Negative for shortness of breath.   Cardiovascular: Negative for chest pain, palpitations and leg swelling.  Gastrointestinal: Negative for abdominal pain, diarrhea, nausea and vomiting.  Genitourinary: Negative for dysuria, flank pain, genital  sores, hematuria, pelvic pain and vaginal discharge.       Vaginal itching.   Skin: Negative for rash.     Vitals BP 128/84   Pulse 89   Temp (!) 97.3 F (36.3 C) (Oral)   Wt 276 lb 6.4 oz (125.4 kg)   SpO2 99%   BMI 44.61 kg/m   Objective:   Physical Exam Vitals reviewed. Exam conducted with a chaperone present.  Constitutional:      Appearance: Normal appearance.  Cardiovascular:     Rate and Rhythm: Normal rate and regular rhythm.     Heart sounds: Normal heart sounds.  Pulmonary:     Effort: Pulmonary effort is normal.     Breath sounds: Normal breath sounds.  Genitourinary:    Labia:        Right: No rash.        Left: No rash.      Comments: Old bloody discharge noted in vaginal vault (on depo).  Skin:    General: Skin is warm and dry.  Neurological:     General: No focal deficit present.     Mental Status: She is alert.  Psychiatric:        Behavior: Behavior normal.      Assessment and Plan   1. Vaginal itching  2. Screen for STD (sexually transmitted disease) - GC/Chlamydia Probe Amp([facility])   Pelvic exam performed, old blood noted in vaginal vault (on depo), no vaginal discharge noted.  Swabs sent for GC chlamydia.  She will let me know if the vaginal itching returns, will not treat for yeast infection today.  Agrees with plan of care discussed today. Understands warning signs to seek further care: chest pain, shortness of breath, any significant change in health.  Understands to follow-up if symptoms return, will notify once lab results are available.    Pecolia Ades, NP 11/09/20

## 2020-11-09 NOTE — Patient Instructions (Signed)
Vaginal Yeast Infection, Adult  Vaginal yeast infection is a condition that causes vaginal discharge as well as soreness, swelling, and redness (inflammation) of the vagina. This is a common condition. Some women get this infection frequently. What are the causes? This condition is caused by a change in the normal balance of the yeast (candida) and bacteria that live in the vagina. This change causes an overgrowth of yeast, which causes the inflammation. What increases the risk? The condition is more likely to develop in women who:  Take antibiotic medicines.  Have diabetes.  Take birth control pills.  Are pregnant.  Douche often.  Have a weak body defense system (immune system).  Have been taking steroid medicines for a long time.  Frequently wear tight clothing. What are the signs or symptoms? Symptoms of this condition include:  White, thick, creamy vaginal discharge.  Swelling, itching, redness, and irritation of the vagina. The lips of the vagina (vulva) may be affected as well.  Pain or a burning feeling while urinating.  Pain during sex. How is this diagnosed? This condition is diagnosed based on:  Your medical history.  A physical exam.  A pelvic exam. Your health care provider will examine a sample of your vaginal discharge under a microscope. Your health care provider may send this sample for testing to confirm the diagnosis. How is this treated? This condition is treated with medicine. Medicines may be over-the-counter or prescription. You may be told to use one or more of the following:  Medicine that is taken by mouth (orally).  Medicine that is applied as a cream (topically).  Medicine that is inserted directly into the vagina (suppository). Follow these instructions at home: Lifestyle  Do not have sex until your health care provider approves. Tell your sex partner that you have a yeast infection. That person should go to his or her health care  provider and ask if they should also be treated.  Do not wear tight clothes, such as pantyhose or tight pants.  Wear breathable cotton underwear. General instructions  Take or apply over-the-counter and prescription medicines only as told by your health care provider.  Eat more yogurt. This may help to keep your yeast infection from returning.  Do not use tampons until your health care provider approves.  Try taking a sitz bath to help with discomfort. This is a warm water bath that is taken while you are sitting down. The water should only come up to your hips and should cover your buttocks. Do this 3-4 times per day or as told by your health care provider.  Do not douche.  If you have diabetes, keep your blood sugar levels under control.  Keep all follow-up visits as told by your health care provider. This is important.   Contact a health care provider if:  You have a fever.  Your symptoms go away and then return.  Your symptoms do not get better with treatment.  Your symptoms get worse.  You have new symptoms.  You develop blisters in or around your vagina.  You have blood coming from your vagina and it is not your menstrual period.  You develop pain in your abdomen. Summary  Vaginal yeast infection is a condition that causes discharge as well as soreness, swelling, and redness (inflammation) of the vagina.  This condition is treated with medicine. Medicines may be over-the-counter or prescription.  Take or apply over-the-counter and prescription medicines only as told by your health care provider.  Do not   douche. Do not have sex or use tampons until your health care provider approves.  Contact a health care provider if your symptoms do not get better with treatment or your symptoms go away and then return. This information is not intended to replace advice given to you by your health care provider. Make sure you discuss any questions you have with your health care  provider. Document Revised: 02/08/2019 Document Reviewed: 11/27/2017 Elsevier Patient Education  2021 Elsevier Inc.  

## 2020-11-10 ENCOUNTER — Other Ambulatory Visit: Payer: BC Managed Care – PPO

## 2020-11-11 LAB — SPECIMEN STATUS REPORT

## 2020-11-11 LAB — GC/CHLAMYDIA PROBE AMP
Chlamydia trachomatis, NAA: NEGATIVE
Neisseria Gonorrhoeae by PCR: NEGATIVE

## 2020-11-12 NOTE — Telephone Encounter (Signed)
Please check with her to see if the first prescription helped with her bleeding. Thanks.

## 2020-11-12 NOTE — Telephone Encounter (Signed)
Pt contacted. Pt states the first script did help with her bleeding. Please advise. Thank you

## 2020-11-19 ENCOUNTER — Ambulatory Visit: Payer: BC Managed Care – PPO | Admitting: Adult Health

## 2020-11-19 ENCOUNTER — Other Ambulatory Visit: Payer: Self-pay

## 2020-11-19 ENCOUNTER — Encounter: Payer: Self-pay | Admitting: Adult Health

## 2020-11-19 VITALS — BP 171/95 | HR 86 | Ht 65.0 in | Wt 273.0 lb

## 2020-11-19 DIAGNOSIS — N898 Other specified noninflammatory disorders of vagina: Secondary | ICD-10-CM | POA: Insufficient documentation

## 2020-11-19 DIAGNOSIS — N930 Postcoital and contact bleeding: Secondary | ICD-10-CM | POA: Diagnosis not present

## 2020-11-19 DIAGNOSIS — A599 Trichomoniasis, unspecified: Secondary | ICD-10-CM | POA: Insufficient documentation

## 2020-11-19 LAB — POCT WET PREP (WET MOUNT)
Clue Cells Wet Prep Whiff POC: POSITIVE
WBC, Wet Prep HPF POC: POSITIVE

## 2020-11-19 MED ORDER — METRONIDAZOLE 500 MG PO TABS: 500.0000 mg | ORAL_TABLET | Freq: Two times a day (BID) | ORAL | 0 refills | Status: DC

## 2020-11-19 NOTE — Patient Instructions (Signed)
Trichomoniasis Trichomoniasis is an STI (sexually transmitted infection) that can affect both women and men. In women, the outer area of the female genitalia (vulva) and the vagina are affected. In men, mainly the penis is affected, but the prostate and other reproductive organs can also be involved.  This condition can be treated with medicine. It often has no symptoms (is asymptomatic), especially in men. If not treated, trichomoniasis can last for months or years. What are the causes? This condition is caused by a parasite called Trichomonas vaginalis. Trichomoniasis most often spreads from person to person (is contagious) through sexual contact. What increases the risk? The following factors may make you more likely to develop this condition:  Having unprotected sex.  Having sex with a partner who has trichomoniasis.  Having multiple sexual partners.  Having had previous trichomoniasis infections or other STIs. What are the signs or symptoms? In women, symptoms of trichomoniasis include:  Abnormal vaginal discharge that is clear, white, gray, or yellow-green and foamy and has an unusual "fishy" odor.  Itching and irritation of the vagina and vulva.  Burning or pain during urination or sex.  Redness and swelling of the genitals. In men, symptoms of trichomoniasis include:  Penile discharge that may be foamy or contain pus.  Pain in the penis. This may happen only when urinating.  Itching or irritation inside the penis.  Burning after urination or ejaculation. How is this diagnosed? In women, this condition may be found during a routine Pap test or physical exam. It may be found in men during a routine physical exam. Your health care provider may do tests to help diagnose this infection, such as:  Urine tests (men and women).  The following in women: ? Testing the pH of the vagina. ? A vaginal swab test that checks for the Trichomonas vaginalis parasite. ? Testing vaginal  secretions. Your health care provider may test you for other STIs, including HIV (human immunodeficiency virus). How is this treated? This condition is treated with medicine taken by mouth (orally), such as metronidazole or tinidazole, to fight the infection. Your sexual partner(s) also need to be tested and treated.  If you are a woman and you plan to become pregnant or think you may be pregnant, tell your health care provider right away. Some medicines that are used to treat the infection should not be taken during pregnancy. Your health care provider may recommend over-the-counter medicines or creams to help relieve itching or irritation. You may be tested for infection again 3 months after treatment.   Follow these instructions at home:  Take and use over-the-counter and prescription medicines, including creams, only as told by your health care provider.  Take your antibiotic medicine as told by your health care provider. Do not stop taking the antibiotic even if you start to feel better.  Do not have sex until 7-10 days after you finish your medicine, or until your health care provider approves. Ask your health care provider when you may start to have sex again.  (Women) Do not douche or wear tampons while you have the infection.  Discuss your infection with your sexual partner(s). Make sure that your partner gets tested and treated, if necessary.  Keep all follow-up visits as told by your health care provider. This is important. How is this prevented?  Use condoms every time you have sex. Using condoms correctly and consistently can help protect against STIs.  Avoid having multiple sexual partners.  Talk with your sexual partner about   any symptoms that either of you may have, as well as any history of STIs.  Get tested for STIs and STDs (sexually transmitted diseases) before you have sex. Ask your partner to do the same.  Do not have sexual contact if you have symptoms of  trichomoniasis or another STI.   Contact a health care provider if:  You still have symptoms after you finish your medicine.  You develop pain in your abdomen.  You have pain when you urinate.  You have bleeding after sex.  You develop a rash.  You feel nauseous or you vomit.  You plan to become pregnant or think you may be pregnant. Summary  Trichomoniasis is an STI (sexually transmitted infection) that can affect both women and men.  This condition often has no symptoms (is asymptomatic), especially in men.  Without treatment, this condition can last for months or years.  You should not have sex until 7-10 days after you finish your medicine, or until your health care provider approves. Ask your health care provider when you may start to have sex again.  Discuss your infection with your sexual partner(s). Make sure that your partner gets tested and treated, if necessary. This information is not intended to replace advice given to you by your health care provider. Make sure you discuss any questions you have with your health care provider. Document Revised: 04/24/2018 Document Reviewed: 04/24/2018 Elsevier Patient Education  2021 Elsevier Inc.  

## 2020-11-19 NOTE — Progress Notes (Signed)
  Subjective:     Patient ID: Elane Fritz, female   DOB: [birth year], 29 y.o.   MRN: [id number]  HPI Estrella is a 30 year old black female,single, G0P0, in complaining of bleeding after sex. She had been on depo and decided to stop had bleeding with it.Saw PCP 11/09/20 for vaginal itching and had negative GC/CHL.  PCP is Dr Ladona Ridgel   Review of Systems Had bleeding on depo, used megace it stopped and she decide not on getting depo again +bleeding after sex Has 2 sex partners  Reviewed past medical,surgical, social and family history. Reviewed medications and allergies.     Objective:   Physical Exam BP (!) 171/95 (BP Location: Left Arm, Patient Position: Sitting, Cuff Size: Normal)   Pulse 86   Ht 5\' 5"  (1.651 m)   Wt 273 lb (123.8 kg)   LMP 10/29/2020 (Exact Date)   BMI 45.43 kg/m  BP was normal 11/09/20 at PCP Skin warm and dry. Lungs: clear to ausculation bilaterally. Cardiovascular: regular rate and rhythm. Skin warm and dry.Pelvic: external genitalia is normal in appearance no lesions, vagina: gray frothy discharge with odor,urethra has no lesions or masses noted, cervix:smooth, uterus: normal size, shape and contour, non tender, no masses felt, adnexa: no masses or tenderness noted. Bladder is non tender and no masses felt. Wet prep:+trich, + few clue cells and +WBCs. Had negative GC/CHL 11/09/20 AA is 3  Fall risk is low PHQ 9 score is 3 GAD 7 score is 0  [facility] - 11/19/20 0912      Pregnancy Intention Screening   Does the patient want to become pregnant in the next year? Unsure    Does the patient's partner want to become pregnant in the next year? Unsure      Contraception Wrap Up   Current Method Hormonal Injection   past due   End Method Female Condom    Contraception Counseling Provided Yes         Examination chaperoned by [facility] LPN.    Assessment:     1. PCB (post coital bleeding) Probably due to trich irritation When resumes sex use condoms   2. Vaginal  discharge Wet prep:+trich, pt looked at slide too  3. Trichimoniasis Will rx flagyl,no sex or alcohol  Meds ordered this encounter  Medications  . metroNIDAZOLE (FLAGYL) 500 MG tablet    Sig: Take 1 tablet (500 mg total) by mouth 2 (two) times daily.    Dispense:  14 tablet    Refill:  0    Order Specific Question:   Supervising Provider    Answer:   [facility] [[id number]]  -gave her prescription for [facility] for flagyl 500 mg #4 4 po now,no alcohol   Handout given on trich Plan:     Return in 11 days for POC with me    Follow up with PCP in BP

## 2020-11-30 ENCOUNTER — Encounter: Payer: Self-pay | Admitting: Adult Health

## 2020-11-30 ENCOUNTER — Ambulatory Visit: Payer: BC Managed Care – PPO | Admitting: Adult Health

## 2020-11-30 ENCOUNTER — Other Ambulatory Visit: Payer: Self-pay

## 2020-11-30 VITALS — BP 174/99 | HR 98 | Ht 65.0 in | Wt 273.2 lb

## 2020-11-30 DIAGNOSIS — I10 Essential (primary) hypertension: Secondary | ICD-10-CM | POA: Diagnosis not present

## 2020-11-30 DIAGNOSIS — A599 Trichomoniasis, unspecified: Secondary | ICD-10-CM

## 2020-11-30 LAB — POCT WET PREP (WET MOUNT): Trichomonas Wet Prep HPF POC: ABSENT

## 2020-11-30 NOTE — Progress Notes (Signed)
  Subjective:     Patient ID: Brianna [name], female   DOB: 19-Jul-1991, 30 y.o.   MRN: [id number]  HPI Brianna [name] is a 30 year old black female,single, G0P0, in for proof of cure for recent +trich. PCP is Dr Ladona Ridgel.  Review of Systems No sex since being treated, no discharge Reviewed past medical,surgical, social and family history. Reviewed medications and allergies.     Objective:   Physical Exam BP (!) 174/99 (BP Location: Left Arm, Cuff Size: Large)   Pulse 98   Ht 5\' 5"  (1.651 m)   Wt 273 lb 3.2 oz (123.9 kg)   LMP 10/29/2020   BMI 45.46 kg/m  Skin warm and dry.Pelvic: external genitalia is normal in appearance no lesions, vagina: scant white discharge without odor,urethra has no lesions or masses noted, cervix:smooth, uterus: normal size, shape and contour, non tender, no masses felt, adnexa: no masses or tenderness noted. Bladder is non tender and no masses felt. Wet prep: few WBC,no trich seen PHQ 2 score is 0  [facility] - 11/30/20 0919      Pregnancy Intention Screening   Does the patient want to become pregnant in the next year? Unsure    Does the patient's partner want to become pregnant in the next year? Unsure    Would the patient like to discuss contraceptive options today? No      Contraception Wrap Up   Current Method Female Condom    End Method Female Condom    Contraception Counseling Provided Yes         Examination chaperoned by 01/30/21.    Assessment:    1. Trichimoniasis, resolved Use condoms  2. Hypertension, unspecified type Go home and take meds    Plan:     Follow up prn

## 2020-12-10 ENCOUNTER — Encounter: Payer: Self-pay | Admitting: Nurse Practitioner

## 2020-12-10 ENCOUNTER — Other Ambulatory Visit: Payer: Self-pay | Admitting: Nurse Practitioner

## 2020-12-10 ENCOUNTER — Other Ambulatory Visit: Payer: Self-pay | Admitting: Adult Health

## 2020-12-10 MED ORDER — NORETHINDRONE 0.35 MG PO TABS: 1.0000 | ORAL_TABLET | Freq: Every day | ORAL | 11 refills | Status: DC

## 2020-12-10 NOTE — Progress Notes (Signed)
Will rx micronor 

## 2020-12-18 ENCOUNTER — Encounter: Payer: Self-pay | Admitting: Nurse Practitioner

## 2020-12-18 DIAGNOSIS — Z79899 Other long term (current) drug therapy: Secondary | ICD-10-CM

## 2020-12-18 DIAGNOSIS — I10 Essential (primary) hypertension: Secondary | ICD-10-CM

## 2020-12-18 DIAGNOSIS — E039 Hypothyroidism, unspecified: Secondary | ICD-10-CM

## 2020-12-18 DIAGNOSIS — E1169 Type 2 diabetes mellitus with other specified complication: Secondary | ICD-10-CM

## 2020-12-23 MED ORDER — LEVOTHYROXINE SODIUM 25 MCG PO TABS: ORAL_TABLET | ORAL | 0 refills | Status: DC

## 2020-12-23 MED ORDER — GLIPIZIDE ER 10 MG PO TB24: 10.0000 mg | ORAL_TABLET | Freq: Every day | ORAL | 0 refills | Status: DC

## 2020-12-23 MED ORDER — METFORMIN HCL ER 750 MG PO TB24: ORAL_TABLET | ORAL | 0 refills | Status: DC

## 2020-12-25 ENCOUNTER — Encounter: Payer: Self-pay | Admitting: Nurse Practitioner

## 2020-12-25 ENCOUNTER — Ambulatory Visit: Payer: BC Managed Care – PPO | Admitting: Nurse Practitioner

## 2020-12-25 VITALS — BP 132/84 | HR 100 | Temp 97.3°F | Wt 268.4 lb

## 2020-12-25 DIAGNOSIS — E039 Hypothyroidism, unspecified: Secondary | ICD-10-CM | POA: Diagnosis not present

## 2020-12-25 DIAGNOSIS — I10 Essential (primary) hypertension: Secondary | ICD-10-CM

## 2020-12-25 DIAGNOSIS — E669 Obesity, unspecified: Secondary | ICD-10-CM | POA: Diagnosis not present

## 2020-12-25 DIAGNOSIS — R0683 Snoring: Secondary | ICD-10-CM

## 2020-12-25 DIAGNOSIS — E1169 Type 2 diabetes mellitus with other specified complication: Secondary | ICD-10-CM

## 2020-12-25 DIAGNOSIS — E785 Hyperlipidemia, unspecified: Secondary | ICD-10-CM | POA: Diagnosis not present

## 2020-12-25 DIAGNOSIS — R5383 Other fatigue: Secondary | ICD-10-CM | POA: Diagnosis not present

## 2020-12-25 DIAGNOSIS — Z79899 Other long term (current) drug therapy: Secondary | ICD-10-CM | POA: Diagnosis not present

## 2020-12-25 MED ORDER — ENALAPRIL MALEATE 20 MG PO TABS: 20.0000 mg | ORAL_TABLET | Freq: Every day | ORAL | 0 refills | Status: DC

## 2020-12-25 MED ORDER — LEVOTHYROXINE SODIUM 25 MCG PO TABS: ORAL_TABLET | ORAL | 0 refills | Status: DC

## 2020-12-25 MED ORDER — METFORMIN HCL ER 750 MG PO TB24: ORAL_TABLET | ORAL | 0 refills | Status: DC

## 2020-12-25 MED ORDER — GLIPIZIDE ER 10 MG PO TB24: 10.0000 mg | ORAL_TABLET | Freq: Every day | ORAL | 0 refills | Status: DC

## 2020-12-25 NOTE — Patient Instructions (Signed)
Rybelsus 

## 2020-12-25 NOTE — Telephone Encounter (Signed)
See note for 12/25/20. Was seen in the office.

## 2020-12-25 NOTE — Progress Notes (Signed)
Subjective:    Patient ID: Brianna [name], female    DOB: 1991/07/05, 30 y.o.   MRN: [id number]  Diabetes She presents for her follow-up diabetic visit. She has type 2 diabetes mellitus. There are no hypoglycemic associated symptoms. There are no diabetic associated symptoms. There are no hypoglycemic complications. There are no diabetic complications. She is compliant with treatment most of the time (not checking sugars often ). She has not had a previous visit with a dietitian. She rarely participates in exercise. She does not see a podiatrist.Eye exam is current.   Pt would like to have a sleep study done due to snoring and fatigue.  [place] questionnaire completed today which was positive, see scanned copy. Does not check her sugars at home.  No major changes in her diet.  Has been staying very busy with work. Just started norethindrone prescribed through gynecology.  Has only been a few days.  Continues to have some bleeding with some clots. Denies chest pain/ischemic type pain or shortness of breath.  No polydipsia polyuria or polyphagia.  Depression screen [facility] 2/9 12/25/2020 11/30/2020 11/19/2020 11/09/2020 10/14/2020  Decreased Interest 0 0 0 0 0  Down, Depressed, Hopeless 0 0 0 0 0  PHQ - 2 Score 0 0 0 0 0  Altered sleeping - - 1 - -  Tired, decreased energy - - 1 - -  Change in appetite - - 1 - -  Feeling bad or failure about yourself  - - 0 - -  Trouble concentrating - - 0 - -  Moving slowly or fidgety/restless - - 0 - -  Suicidal thoughts - - 0 - -  PHQ-9 Score - - 3 - -  Difficult doing work/chores - - - - -       Objective:   Physical Exam NAD.  Alert, oriented.  Lungs clear.  Heart regular rate and rhythm. Today's Vitals   12/25/20 1059  BP: 132/84  Pulse: 100  Temp: (!) 97.3 F (36.3 C)  SpO2: 99%  Weight: 268 lb 6.4 oz (121.7 kg)   Body mass index is 44.66 kg/m. Diabetic Foot Exam - Simple   Simple Foot Form Diabetic Foot exam was performed with the following  findings: Yes 12/25/2020 11:00 AM  Visual Inspection No deformities, no ulcerations, no other skin breakdown bilaterally: Yes Sensation Testing Intact to touch and monofilament testing bilaterally: Yes Pulse Check See comments: Yes Comments DP pulses present bilateral.  Skin warm with normal capillary refill.          Assessment & Plan:   Problem List Items Addressed This Visit      Cardiovascular and Mediastinum   HTN (hypertension) - Primary   Relevant Medications   enalapril (VASOTEC) 20 MG tablet     Endocrine   Hyperlipidemia associated with type 2 diabetes mellitus (HCC)   Relevant Medications   enalapril (VASOTEC) 20 MG tablet   glipiZIDE (GLUCOTROL XL) 10 MG 24 hr tablet   metFORMIN (GLUCOPHAGE-XR) 750 MG 24 hr tablet     Other   Morbid obesity (HCC)   Relevant Medications   glipiZIDE (GLUCOTROL XL) 10 MG 24 hr tablet   metFORMIN (GLUCOPHAGE-XR) 750 MG 24 hr tablet   Other Relevant Orders   Ambulatory referral to Sleep Studies    Other Visit Diagnoses    Snoring       Relevant Orders   Ambulatory referral to Sleep Studies   Fatigue, unspecified type       Relevant Orders  Ambulatory referral to Sleep Studies     Meds ordered this encounter  Medications  . enalapril (VASOTEC) 20 MG tablet    Sig: Take 1 tablet (20 mg total) by mouth daily.    Dispense:  90 tablet    Refill:  0    Order Specific Question:   Supervising Provider    Answer:   Lilyan Punt A [[id number]]  . glipiZIDE (GLUCOTROL XL) 10 MG 24 hr tablet    Sig: Take 1 tablet (10 mg total) by mouth daily with breakfast.    Dispense:  90 tablet    Refill:  0    Order Specific Question:   Supervising Provider    Answer:   Lilyan Punt A [[id number]]  . levothyroxine (SYNTHROID) 25 MCG tablet    Sig: TAKE 1 TABLET BY MOUTH EVERY DAY BEFORE BREAKFAST    Dispense:  90 tablet    Refill:  0    Order Specific Question:   Supervising Provider    Answer:   Lilyan Punt A [[id number]]  . metFORMIN  (GLUCOPHAGE-XR) 750 MG 24 hr tablet    Sig: Take 1 tab p.o. with meal.    Dispense:  90 tablet    Refill:  0    Order Specific Question:   Supervising Provider    Answer:   Lilyan Punt A [[id number]]   Patient to get fasting labs when she leaves the office.  For now continue current medications as directed pending results. Positive [place] questionnaire.  Will refer for home-based sleep testing.  Encourage patient to continue work on healthy diet regular activity and weight loss.  Patient had difficulty with GLP-1 injectables.  Recommend that she look into the cost for Rybelsus.  If this is an option patient to contact office so we can prescribe. Patient should expect slow gradual resolution of vaginal bleeding over the next 2 to 3 weeks.  Call back if bleeding worsens or persists. Return in about 3 months (around 03/27/2021).

## 2020-12-26 ENCOUNTER — Encounter: Payer: Self-pay | Admitting: Nurse Practitioner

## 2020-12-26 LAB — HEMOGLOBIN A1C
Est. average glucose Bld gHb Est-mCnc: 200 mg/dL
Hgb A1c MFr Bld: 8.6 % — ABNORMAL HIGH (ref 4.8–5.6)

## 2020-12-26 LAB — LIPID PANEL
Chol/HDL Ratio: 3.8 ratio (ref 0.0–4.4)
Cholesterol, Total: 171 mg/dL (ref 100–199)
HDL: 45 mg/dL (ref >39)
LDL Chol Calc (NIH): 118 mg/dL — ABNORMAL HIGH (ref 0–99)
Triglycerides: 39 mg/dL (ref 0–149)
VLDL Cholesterol Cal: 8 mg/dL (ref 5–40)

## 2020-12-26 LAB — COMPREHENSIVE METABOLIC PANEL
ALT: 16 IU/L (ref 0–32)
AST: 10 IU/L (ref 0–40)
Albumin/Globulin Ratio: 1.3 (ref 1.2–2.2)
Albumin: 4.5 g/dL (ref 3.9–5.0)
Alkaline Phosphatase: 90 IU/L (ref 44–121)
BUN/Creatinine Ratio: 20 (ref 9–23)
BUN: 11 mg/dL (ref 6–20)
Bilirubin Total: 0.2 mg/dL (ref 0.0–1.2)
CO2: 19 mmol/L — ABNORMAL LOW (ref 20–29)
Calcium: 9.8 mg/dL (ref 8.7–10.2)
Chloride: 99 mmol/L (ref 96–106)
Creatinine, Ser: 0.56 mg/dL — ABNORMAL LOW (ref 0.57–1.00)
Globulin, Total: 3.4 g/dL (ref 1.5–4.5)
Glucose: 212 mg/dL — ABNORMAL HIGH (ref 65–99)
Potassium: 4.7 mmol/L (ref 3.5–5.2)
Sodium: 139 mmol/L (ref 134–144)
Total Protein: 7.9 g/dL (ref 6.0–8.5)
eGFR: 127 mL/min/{1.73_m2} (ref >59)

## 2020-12-26 LAB — CBC WITH DIFFERENTIAL/PLATELET
Basophils Absolute: 0 10*3/uL (ref 0.0–0.2)
Basos: 1 %
EOS (ABSOLUTE): 0.1 10*3/uL (ref 0.0–0.4)
Eos: 2 %
Hematocrit: 33.5 % — ABNORMAL LOW (ref 34.0–46.6)
Hemoglobin: 10.8 g/dL — ABNORMAL LOW (ref 11.1–15.9)
Immature Grans (Abs): 0 10*3/uL (ref 0.0–0.1)
Immature Granulocytes: 0 %
Lymphocytes Absolute: 1.4 10*3/uL (ref 0.7–3.1)
Lymphs: 21 %
MCH: 27.9 pg (ref 26.6–33.0)
MCHC: 32.2 g/dL (ref 31.5–35.7)
MCV: 87 fL (ref 79–97)
Monocytes Absolute: 0.4 10*3/uL (ref 0.1–0.9)
Monocytes: 7 %
Neutrophils Absolute: 4.5 10*3/uL (ref 1.4–7.0)
Neutrophils: 69 %
Platelets: 245 10*3/uL (ref 150–450)
RBC: 3.87 x10E6/uL (ref 3.77–5.28)
RDW: 12.5 % (ref 11.7–15.4)
WBC: 6.5 10*3/uL (ref 3.4–10.8)

## 2020-12-26 LAB — TSH: TSH: 2.99 u[IU]/mL (ref 0.450–4.500)

## 2020-12-28 ENCOUNTER — Encounter: Payer: Self-pay | Admitting: Nurse Practitioner

## 2020-12-29 ENCOUNTER — Other Ambulatory Visit: Payer: Self-pay

## 2020-12-29 DIAGNOSIS — E1169 Type 2 diabetes mellitus with other specified complication: Secondary | ICD-10-CM

## 2020-12-29 DIAGNOSIS — E785 Hyperlipidemia, unspecified: Secondary | ICD-10-CM

## 2020-12-29 DIAGNOSIS — I10 Essential (primary) hypertension: Secondary | ICD-10-CM

## 2021-01-07 NOTE — Telephone Encounter (Signed)
Called pt she states last time she checked sugar was at last visit and it was 220. She does not check her sugars. Asked if she had a way to check her sugar because she feels like it has been running higher than 220 and she said no because she is at work and will check when she gets home.does take metformin and glipize every day.

## 2021-01-07 NOTE — Telephone Encounter (Signed)
Urgent care or may be scheduled with me if I have an opening (currently being the only provider very limited openings)

## 2021-01-11 ENCOUNTER — Encounter: Payer: Self-pay | Admitting: Nurse Practitioner

## 2021-01-13 ENCOUNTER — Other Ambulatory Visit (INDEPENDENT_AMBULATORY_CARE_PROVIDER_SITE_OTHER): Payer: BC Managed Care – PPO

## 2021-01-13 ENCOUNTER — Other Ambulatory Visit (HOSPITAL_COMMUNITY)
Admission: RE | Admit: 2021-01-13 | Discharge: 2021-01-13 | Disposition: A | Discharge status: Home / Self Care | Payer: BC Managed Care – PPO | Source: Ambulatory Visit | Attending: Obstetrics & Gynecology | Admitting: Obstetrics & Gynecology

## 2021-01-13 ENCOUNTER — Other Ambulatory Visit: Payer: Self-pay

## 2021-01-13 DIAGNOSIS — Z3200 Encounter for pregnancy test, result unknown: Secondary | ICD-10-CM | POA: Diagnosis not present

## 2021-01-13 DIAGNOSIS — N898 Other specified noninflammatory disorders of vagina: Secondary | ICD-10-CM | POA: Diagnosis not present

## 2021-01-13 LAB — POCT URINE PREGNANCY: Preg Test, Ur: NEGATIVE

## 2021-01-13 NOTE — Progress Notes (Signed)
Chart reviewed for nurse visit. Agree with plan of care.  Adline Potter, NP 01/13/2021 1:34 PM

## 2021-01-13 NOTE — Progress Notes (Signed)
   NURSE VISIT- VAGINITIS/STD/POC  SUBJECTIVE:  Christana Angelica is a 30 y.o. G0P0000 GYN patientfemale here for a vaginal swab for vaginitis screening, STD screen.  She reports the following symptoms: discharge described as white for 4 days. Denies abnormal vaginal bleeding, significant pelvic pain, fever, or UTI symptoms.  OBJECTIVE:  There were no vitals taken for this visit.  Appears well, in no apparent distress  ASSESSMENT: Vaginal swab for  vaginitis & STD screening  PLAN: Self-collected vaginal probe for Gonorrhea, Chlamydia, Trichomonas, Bacterial Vaginosis, Yeast sent to lab Treatment: to be determined once results are received Follow-up as needed if symptoms persist/worsen, or new symptoms develop  Jolly Bleicher A Zamia Tyminski  01/13/2021 10:18 AM

## 2021-01-14 ENCOUNTER — Telehealth: Payer: Self-pay | Admitting: Adult Health

## 2021-01-14 LAB — CERVICOVAGINAL ANCILLARY ONLY
Bacterial Vaginitis (gardnerella): POSITIVE — AB
Candida Glabrata: NEGATIVE
Candida Vaginitis: POSITIVE — AB
Chlamydia: NEGATIVE
Comment: NEGATIVE
Comment: NEGATIVE
Comment: NEGATIVE
Comment: NEGATIVE
Comment: NEGATIVE
Comment: NORMAL
Neisseria Gonorrhea: NEGATIVE
Trichomonas: NEGATIVE

## 2021-01-14 MED ORDER — FLUCONAZOLE 150 MG PO TABS: ORAL_TABLET | ORAL | 1 refills | Status: DC

## 2021-01-14 MED ORDER — METRONIDAZOLE 500 MG PO TABS: 500.0000 mg | ORAL_TABLET | Freq: Two times a day (BID) | ORAL | 0 refills | Status: DC

## 2021-01-14 NOTE — Telephone Encounter (Signed)
Pt aware vaginal swab +BV and yeast will rx flagyl and diflucan

## 2021-01-15 ENCOUNTER — Encounter: Payer: Self-pay | Admitting: Nurse Practitioner

## 2021-01-18 ENCOUNTER — Ambulatory Visit: Payer: BC Managed Care – PPO | Admitting: Family Medicine

## 2021-01-18 ENCOUNTER — Other Ambulatory Visit: Payer: Self-pay | Admitting: *Deleted

## 2021-01-18 DIAGNOSIS — R0683 Snoring: Secondary | ICD-10-CM

## 2021-01-18 DIAGNOSIS — R5383 Other fatigue: Secondary | ICD-10-CM

## 2021-03-01 ENCOUNTER — Telehealth: Payer: Self-pay | Admitting: Family Medicine

## 2021-03-01 NOTE — Telephone Encounter (Signed)
Campbell Riches, NP  Marlowe Shores, LPN Kenney Houseman, can you check on the referral to sleep lab for this patient? I referred her for a home based sleep test the first week of June and she has not heardfrom them. Thanks! Priscille Heidelberg message to referral coordinator to see if she has heard anything.

## 2021-03-03 NOTE — Telephone Encounter (Signed)
Contacted [facility] and they state is was routed to [facility] but did not drop in the work queue. Receptionist at sleep center states she will contact patient insurance and then reach out to [facility].

## 2021-03-14 ENCOUNTER — Other Ambulatory Visit: Payer: Self-pay

## 2021-03-14 ENCOUNTER — Ambulatory Visit: Payer: BC Managed Care – PPO | Attending: Nurse Practitioner | Admitting: Neurology

## 2021-03-14 DIAGNOSIS — G4733 Obstructive sleep apnea (adult) (pediatric): Secondary | ICD-10-CM | POA: Insufficient documentation

## 2021-03-14 DIAGNOSIS — R5383 Other fatigue: Secondary | ICD-10-CM

## 2021-03-14 DIAGNOSIS — R0683 Snoring: Secondary | ICD-10-CM

## 2021-03-17 ENCOUNTER — Other Ambulatory Visit: Payer: Self-pay | Admitting: Adult Health

## 2021-03-17 MED ORDER — FLUCONAZOLE 150 MG PO TABS: ORAL_TABLET | ORAL | 1 refills | Status: DC

## 2021-03-17 MED ORDER — METRONIDAZOLE 500 MG PO TABS: 500.0000 mg | ORAL_TABLET | Freq: Two times a day (BID) | ORAL | 0 refills | Status: DC

## 2021-03-17 NOTE — Progress Notes (Signed)
Refilled flagyl and diflucan 

## 2021-03-29 NOTE — Procedures (Signed)
   [place]ent[place] A. Merlene Laughter, MD     [email or web address]             HOME SLEEP STUDY  LOCATION: [place]  Patient Name: Brianna [name], Brianna [name] Date: 03/14/2021 Gender: Female D.O.B: [birth year] Age (years): 30 Referring Provider: Nilda Simmer NP Height (inches): 53 Interpreting Physician: Phillips Odor MD, ABSM Weight (lbs): 268 RPSGT: Rosebud Poles BMI: 45 MRN: [id number] Neck Size: CLINICAL INFORMATION Sleep Study Type: HST     Indication for sleep study: N/A     Epworth Sleepiness Score: N/A  SLEEP STUDY TECHNIQUE A multi-channel overnight portable sleep study was performed. The channels recorded were: nasal airflow, thoracic respiratory movement, and oxygen saturation with a pulse oximetry. Snoring was also monitored.  MEDICATIONS Patient self administered medications include: N/A.  Current Outpatient Medications:    blood glucose meter kit and supplies KIT, Dispense based on patient and insurance preference. Test blood sugar once daily. Diabetes type 2. E11.9, Disp: 1 each, Rfl: 5   enalapril (VASOTEC) 20 MG tablet, Take 1 tablet (20 mg total) by mouth daily., Disp: 90 tablet, Rfl: 0   fluconazole (DIFLUCAN) 150 MG tablet, Take 1 now and 1 in 3 days, Disp: 2 tablet, Rfl: 1   glipiZIDE (GLUCOTROL XL) 10 MG 24 hr tablet, Take 1 tablet (10 mg total) by mouth daily with breakfast., Disp: 90 tablet, Rfl: 0   Lancets (FREESTYLE) lancets, Use as instructed, Disp: 100 each, Rfl: 5   levothyroxine (SYNTHROID) 25 MCG tablet, TAKE 1 TABLET BY MOUTH EVERY DAY BEFORE BREAKFAST, Disp: 90 tablet, Rfl: 0   metFORMIN (GLUCOPHAGE-XR) 750 MG 24 hr tablet, Take 1 tab p.o. with meal., Disp: 90 tablet, Rfl: 0   metroNIDAZOLE (FLAGYL) 500 MG tablet, Take 1 tablet (500 mg total) by mouth 2 (two) times daily., Disp: 14 tablet, Rfl: 0   norethindrone (MICRONOR) 0.35 MG tablet, Take 1 tablet (0.35 mg total) by mouth daily., Disp: 28 tablet, Rfl: 11   OVER THE  COUNTER MEDICATION, Vit d Vit c  elderberry, Disp: , Rfl:    SLEEP ARCHITECTURE Patient was studied for 401.5 minutes. The sleep efficiency was 83.7 % and the patient was supine for 5.2%. The arousal index was 0.1 per hour.  RESPIRATORY PARAMETERS The overall AHI was 27.9 per hour, with a central apnea index of 0.1 per hour.  The oxygen nadir was 81% during sleep.     CARDIAC DATA Mean heart rate during sleep was 99.3 bpm.  IMPRESSIONS  Moderate obstructive sleep apnea is documented with this study. AutoPAP 8-20 is recommended.    Delano Metz, MD Diplomate, American Board of Sleep Medicine.  ELECTRONICALLY SIGNED ON:  03/29/2021, 6:02 PM [place] PH: [phone]   FX: [phone] [place]

## 2021-03-30 ENCOUNTER — Other Ambulatory Visit: Payer: Self-pay

## 2021-03-30 ENCOUNTER — Encounter (HOSPITAL_COMMUNITY): Payer: Self-pay

## 2021-03-30 DIAGNOSIS — E1169 Type 2 diabetes mellitus with other specified complication: Secondary | ICD-10-CM | POA: Diagnosis not present

## 2021-03-30 DIAGNOSIS — Z79899 Other long term (current) drug therapy: Secondary | ICD-10-CM | POA: Insufficient documentation

## 2021-03-30 DIAGNOSIS — E039 Hypothyroidism, unspecified: Secondary | ICD-10-CM | POA: Insufficient documentation

## 2021-03-30 DIAGNOSIS — J111 Influenza due to unidentified influenza virus with other respiratory manifestations: Secondary | ICD-10-CM | POA: Insufficient documentation

## 2021-03-30 DIAGNOSIS — M542 Cervicalgia: Secondary | ICD-10-CM | POA: Diagnosis not present

## 2021-03-30 DIAGNOSIS — Z7984 Long term (current) use of oral hypoglycemic drugs: Secondary | ICD-10-CM | POA: Diagnosis not present

## 2021-03-30 DIAGNOSIS — R509 Fever, unspecified: Secondary | ICD-10-CM | POA: Diagnosis not present

## 2021-03-30 DIAGNOSIS — I10 Essential (primary) hypertension: Secondary | ICD-10-CM | POA: Insufficient documentation

## 2021-03-30 DIAGNOSIS — J209 Acute bronchitis, unspecified: Secondary | ICD-10-CM | POA: Diagnosis not present

## 2021-03-30 DIAGNOSIS — J069 Acute upper respiratory infection, unspecified: Secondary | ICD-10-CM | POA: Diagnosis not present

## 2021-03-30 DIAGNOSIS — Z20822 Contact with and (suspected) exposure to covid-19: Secondary | ICD-10-CM | POA: Diagnosis not present

## 2021-03-30 MED ORDER — IBUPROFEN 800 MG PO TABS
800.0000 mg | ORAL_TABLET | Freq: Once | ORAL | Status: AC
  Administered 2021-03-30: 800 mg via ORAL
  Filled 2021-03-30: qty 1

## 2021-03-30 NOTE — ED Triage Notes (Addendum)
Pov from home with cc of headache, fever and high HR. Went to UC today and they started her on a Z-pack 128hr and 101.3 in triage.  Tylenol at 5pm

## 2021-03-31 ENCOUNTER — Emergency Department (HOSPITAL_COMMUNITY)
Admission: EM | Admit: 2021-03-31 | Discharge: 2021-03-31 | Disposition: A | Discharge status: Home / Self Care | Payer: BC Managed Care – PPO | Attending: Emergency Medicine | Admitting: Emergency Medicine

## 2021-03-31 DIAGNOSIS — I10 Essential (primary) hypertension: Secondary | ICD-10-CM | POA: Diagnosis not present

## 2021-03-31 DIAGNOSIS — J111 Influenza due to unidentified influenza virus with other respiratory manifestations: Secondary | ICD-10-CM

## 2021-03-31 LAB — RESP PANEL BY RT-PCR (FLU A&B, COVID) ARPGX2
Influenza A by PCR: NEGATIVE
Influenza B by PCR: NEGATIVE
SARS Coronavirus 2 by RT PCR: NEGATIVE

## 2021-03-31 MED ORDER — ONDANSETRON HCL 4 MG PO TABS: 4.0000 mg | ORAL_TABLET | Freq: Four times a day (QID) | ORAL | 0 refills | Status: DC | PRN

## 2021-03-31 MED ORDER — LACTATED RINGERS IV BOLUS
1000.0000 mL | Freq: Once | INTRAVENOUS | Status: AC
  Administered 2021-03-31: 1000 mL via INTRAVENOUS

## 2021-03-31 MED ORDER — ONDANSETRON HCL 4 MG/2ML IJ SOLN
4.0000 mg | Freq: Once | INTRAMUSCULAR | Status: AC
  Administered 2021-03-31: 4 mg via INTRAVENOUS
  Filled 2021-03-31: qty 2

## 2021-03-31 NOTE — Discharge Instructions (Addendum)
Drink plenty of fluids.  Take ibuprofen and/or acetaminophen as needed for fever or aching.  Return if symptoms are getting worse.  Your test for COVID-19 is not back yet.  Please check for the results on MyChart.  If it is positive, you will need to isolate yourself for at least another 3 days.

## 2021-03-31 NOTE — ED Provider Notes (Signed)
[facility] EMERGENCY DEPARTMENT Provider Note   CSN: [id number] Arrival date & time: 03/30/21  2221     History Chief Complaint  Patient presents with   Headache   Fever    Brianna [name] is a 30 y.o. female.  The history is provided by the patient.  Headache Associated symptoms: fever   Fever Associated symptoms: headaches   She has history of hypertension, diabetes and comes in with fever and headache for the last 3 days.  Headache is diffuse and she is also complaining of some soreness in her neck.  There has been associated nausea and vomiting.  Temperature has been as high as 100.0 at home.  She denies rhinorrhea, sore throat, cough.  She denies any dysuria.  She denies any sick contacts.  She has been vaccinated against COVID-19.  While she was in the emergency department, she was given a dose of ibuprofen.  Following that, temperature has come down and headache is completely resolved as is the neck pain.   Past Medical History:  Diagnosis Date   Diabetes mellitus without complication ([place])    Hypertension    Obesity    Pain    back pain, dx. herniated nucleus polpusos   Thyroid disease     Patient Active Problem List   Diagnosis Date Noted   Trichimoniasis 11/19/2020   Vaginal discharge 11/19/2020   PCB (post coital bleeding) 11/19/2020   Vaginal itching 11/09/2020   Screen for STD (sexually transmitted disease) 11/09/2020   Hypothyroidism 12/09/2019   Hyperlipidemia associated with type 2 diabetes mellitus ([place]) 04/19/2017   Fever presenting with conditions classified elsewhere 09/04/2014   HNP (herniated nucleus pulposus), lumbar 09/03/2014   Morbid obesity ([place]) 09/03/2014   Diabetes mellitus type 2 in obese ([place]) 09/03/2014   HTN (hypertension) 09/03/2014    Past Surgical History:  Procedure Laterality Date   LUMBAR LAMINECTOMY/DECOMPRESSION MICRODISCECTOMY N/A 09/03/2014   Procedure: MICROLUMBAR DECOMPRESSION LUMBAR FOUR TO FIVE, LUMBAR FIVE TO SACRAL  ONE;  Surgeon: Johnn Hai, MD;  Location: [facility];  Service: Orthopedics;  Laterality: N/A;   WISDOM TOOTH EXTRACTION       OB History     Gravida  0   Para  0   Term  0   Preterm  0   AB  0   Living  0      SAB  0   IAB  0   Ectopic  0   Multiple  0   Live Births  0           Family History  Problem Relation Age of Onset   Diabetes Paternal Grandfather    Hypertension Paternal Grandfather    Diabetes Paternal Grandmother    Hypertension Paternal Grandmother    Colon cancer Paternal Grandmother    Diabetes Maternal Grandmother    Hypertension Maternal Grandmother    Cancer Maternal Grandmother    Diabetes Maternal Grandfather    Hypertension Maternal Grandfather    Hypertension Father    Diabetes Mother    Hypertension Brother     Social History   Tobacco Use   Smoking status: Never   Smokeless tobacco: Never  Vaping Use   Vaping Use: Never used  Substance Use Topics   Alcohol use: Yes   Drug use: No    Home Medications Prior to Admission medications   Medication Sig Start Date End Date Taking? Authorizing Provider  blood glucose meter kit and supplies KIT Dispense based on patient and  insurance preference. Test blood sugar once daily. Diabetes type 2. E11.9 10/16/17   Mikey Kirschner, MD  enalapril (VASOTEC) 20 MG tablet Take 1 tablet (20 mg total) by mouth daily. 12/25/20   Nilda Simmer, NP  fluconazole (DIFLUCAN) 150 MG tablet Take 1 now and 1 in 3 days 03/17/21   Estill Dooms, NP  glipiZIDE (GLUCOTROL XL) 10 MG 24 hr tablet Take 1 tablet (10 mg total) by mouth daily with breakfast. 12/25/20   Nilda Simmer, NP  Lancets (FREESTYLE) lancets Use as instructed 10/16/17   Mikey Kirschner, MD  levothyroxine (SYNTHROID) 25 MCG tablet TAKE 1 TABLET BY MOUTH EVERY DAY BEFORE BREAKFAST 12/25/20   Nilda Simmer, NP  metFORMIN (GLUCOPHAGE-XR) 750 MG 24 hr tablet Take 1 tab p.o. with meal. 12/25/20   Nilda Simmer, NP   metroNIDAZOLE (FLAGYL) 500 MG tablet Take 1 tablet (500 mg total) by mouth 2 (two) times daily. 03/17/21   Estill Dooms, NP  norethindrone (MICRONOR) 0.35 MG tablet Take 1 tablet (0.35 mg total) by mouth daily. 12/10/20   Estill Dooms, NP  OVER THE COUNTER MEDICATION Vit d Vit c  elderberry    [provider]    Allergies    Patient has no known allergies.  Review of Systems   Review of Systems  Constitutional:  Positive for fever.  Neurological:  Positive for headaches.  All other systems reviewed and are negative.  Physical Exam Updated Vital Signs BP (!) 132/91   Pulse 100   Temp 98.7 F (37.1 C) (Oral)   Resp 18   Ht 5' 5"  (1.651 m)   Wt 121.7 kg   LMP 03/21/2021 (Approximate)   SpO2 99%   BMI 44.65 kg/m   Physical Exam Vitals and nursing note reviewed.  30 year old female, resting comfortably and in no acute distress. Vital signs are significant for initial fever and tachycardia, both of which have resolved. Oxygen saturation is 99%, which is normal. Head is normocephalic and atraumatic. PERRLA, EOMI. Oropharynx is clear. Neck is nontender and supple without adenopathy or JVD. Back is nontender and there is no CVA tenderness. Lungs are clear without rales, wheezes, or rhonchi. Chest is nontender. Heart has regular rate and rhythm without murmur. Abdomen is soft, flat, nontender without masses or hepatosplenomegaly and peristalsis is hypoactive. Extremities have no cyanosis or edema, full range of motion is present. Skin is warm and dry without rash. Neurologic: Mental status is normal, cranial nerves are intact, moves all extremities equally.  ED Results / Procedures / Treatments   Labs (all labs ordered are listed, but only abnormal results are displayed) Labs Reviewed  RESP PANEL BY RT-PCR (FLU A&B, COVID) [id number]    Procedures Procedures   Medications Ordered in ED Medications  ibuprofen (ADVIL) tablet 800 mg (800 mg Oral Given  03/30/21 2248)    ED Course  I have reviewed the triage vital signs and the nursing notes.  Pertinent lab results that were available during my care of the patient were reviewed by me and considered in my medical decision making (see chart for details).  MDM Rules/Calculators/A&P                         Fever, headache, neck pain with associated nausea and vomiting.  Overall, picture is consistent with a viral syndrome.  There is no meningismus on exam, no findings to suggest meningitis.  Symptoms seem to be  present only when she has a fever.  Will check respiratory pathogen panel and give IV fluids and ondansetron.  Old records are reviewed, and she has no relevant past visits.  She did note modest improvement with above-noted treatment.  Neck pain is gone and nausea is gone.  Minimal headache persists.  COVID-19 PCR is still pending.  She is discharged with a prescription for ondansetron and advised to take over-the-counter antipyretics/analgesics as needed.  Return if symptoms worsen.  Also, advised that if COVID-19 PCR is positive, she will need to isolate for an additional 3 days.  Final Clinical Impression(s) / ED Diagnoses Final diagnoses:  Influenza-like illness  Elevated blood pressure reading with diagnosis of hypertension    Rx / DC Orders ED Discharge Orders          Ordered    ondansetron (ZOFRAN) 4 MG tablet  Every 6 hours PRN        03/31/21 0630             Delora Fuel, MD 16/01/09 0725

## 2021-04-01 ENCOUNTER — Other Ambulatory Visit: Payer: Self-pay

## 2021-04-01 ENCOUNTER — Other Ambulatory Visit (INDEPENDENT_AMBULATORY_CARE_PROVIDER_SITE_OTHER): Payer: BC Managed Care – PPO

## 2021-04-01 ENCOUNTER — Other Ambulatory Visit (HOSPITAL_COMMUNITY)
Admission: RE | Admit: 2021-04-01 | Discharge: 2021-04-01 | Disposition: A | Discharge status: Home / Self Care | Payer: BC Managed Care – PPO | Source: Ambulatory Visit | Attending: Obstetrics & Gynecology | Admitting: Obstetrics & Gynecology

## 2021-04-01 DIAGNOSIS — Z113 Encounter for screening for infections with a predominantly sexual mode of transmission: Secondary | ICD-10-CM | POA: Insufficient documentation

## 2021-04-01 NOTE — Progress Notes (Signed)
   NURSE VISIT- VAGINITIS/STD/POC  SUBJECTIVE:  Brianna [name] is a 30 y.o. G0P0000 GYN patientfemale here for a vaginal swab for vaginitis screening, STD screen.  She reports the following symptoms: burning for 1 week. Denies abnormal vaginal bleeding, significant pelvic pain, fever, or UTI symptoms.  OBJECTIVE:  LMP 03/21/2021 (Approximate)   Appears well, in no apparent distress  ASSESSMENT: Vaginal swab for  vaginitis & STD screening  PLAN: Self-collected vaginal probe for Gonorrhea, Chlamydia, Trichomonas, Bacterial Vaginosis, Yeast sent to lab Treatment: to be determined once results are received Follow-up as needed if symptoms persist/worsen, or new symptoms develop  Mohogany Toppins A Zong Mcquarrie  04/01/2021 10:12 AM

## 2021-04-01 NOTE — Progress Notes (Signed)
Chart reviewed for nurse visit. Agree with plan of care.  Adline Potter, NP 04/01/2021 1:31 PM

## 2021-04-02 ENCOUNTER — Encounter: Payer: Self-pay | Admitting: Nurse Practitioner

## 2021-04-02 DIAGNOSIS — G4733 Obstructive sleep apnea (adult) (pediatric): Secondary | ICD-10-CM | POA: Insufficient documentation

## 2021-04-02 LAB — CERVICOVAGINAL ANCILLARY ONLY
Bacterial Vaginitis (gardnerella): NEGATIVE
Candida Glabrata: NEGATIVE
Candida Vaginitis: NEGATIVE
Chlamydia: NEGATIVE
Comment: NEGATIVE
Comment: NEGATIVE
Comment: NEGATIVE
Comment: NEGATIVE
Comment: NEGATIVE
Comment: NORMAL
Neisseria Gonorrhea: NEGATIVE
Trichomonas: NEGATIVE

## 2021-04-13 ENCOUNTER — Telehealth: Payer: Self-pay | Admitting: *Deleted

## 2021-04-13 NOTE — Telephone Encounter (Signed)
Per Sherie Don NP: Please schedule patient appt with me to discuss results of recent sleep study Thanks

## 2021-04-14 NOTE — Telephone Encounter (Signed)
Left message with patient that schedule appoint on 9/30 at 9:20 before your schedule got full

## 2021-04-23 ENCOUNTER — Ambulatory Visit: Payer: BC Managed Care – PPO | Admitting: Nurse Practitioner

## 2021-04-30 ENCOUNTER — Encounter: Payer: Self-pay | Admitting: Nurse Practitioner

## 2021-04-30 ENCOUNTER — Ambulatory Visit (INDEPENDENT_AMBULATORY_CARE_PROVIDER_SITE_OTHER): Payer: BC Managed Care – PPO | Admitting: Nurse Practitioner

## 2021-04-30 ENCOUNTER — Other Ambulatory Visit: Payer: Self-pay

## 2021-04-30 VITALS — BP 137/91 | Ht 65.0 in | Wt 269.2 lb

## 2021-04-30 DIAGNOSIS — E669 Obesity, unspecified: Secondary | ICD-10-CM

## 2021-04-30 DIAGNOSIS — B372 Candidiasis of skin and nail: Secondary | ICD-10-CM | POA: Diagnosis not present

## 2021-04-30 DIAGNOSIS — G4733 Obstructive sleep apnea (adult) (pediatric): Secondary | ICD-10-CM

## 2021-04-30 DIAGNOSIS — E1169 Type 2 diabetes mellitus with other specified complication: Secondary | ICD-10-CM | POA: Diagnosis not present

## 2021-04-30 MED ORDER — NYSTATIN 100000 UNIT/GM EX POWD: 1.0000 "application " | Freq: Three times a day (TID) | CUTANEOUS | 0 refills | Status: DC

## 2021-04-30 MED ORDER — TIRZEPATIDE 2.5 MG/0.5ML ~~LOC~~ SOAJ: 2.5000 mg | SUBCUTANEOUS | 0 refills | Status: DC

## 2021-04-30 NOTE — Progress Notes (Signed)
   Subjective:    Patient ID: Brianna [name], female    DOB: 1990/08/08, 30 y.o.   MRN: [id number]  HPI  Patient arrives to discuss results of recent sleep study. Adherent to medication regimen.  Is due for flu vaccine, patient wishes to hold on this until next Wednesday when she sees Dr. Adriana Simas. Lab work ordered in June is to particularly A1c.  Has had trouble starting GLP-1 medications due to side effects of costs. Also complaints of an itchy slightly burning rash under the abdomen and the breast.      Objective:   Physical Exam NAD.  Alert, oriented.  Lungs clear.  Heart regular rate rhythm.  Confluent shiny rash noted covering most of the intertriginous area smaller patches noted beneath the breast. Sleep study dated 03/14/2021 indicates moderate OSA. Today's Vitals   04/30/21 0913  BP: (!) 137/91  Weight: 269 lb 3.2 oz (122.1 kg)  Height: 5\' 5"  (1.651 m)   Body mass index is 44.8 kg/m.      Assessment & Plan:  Obstructive sleep apnea  Diabetes mellitus type 2 in obese (HCC)  Candidiasis of skin Meds ordered this encounter  Medications   tirzepatide (MOUNJARO) 2.5 MG/0.5ML Pen    Sig: Inject 2.5 mg into the skin once a week.    Dispense:  2 mL    Refill:  0    Patient will be using the discount through [employer]. Thanks.    Order Specific Question:   Supervising Provider    Answer:   A [[id number]]   nystatin powder    Sig: Apply 1 application topically 3 (three) times daily. Prn yeast infection    Dispense:  30 g    Refill:  0    Order Specific Question:   Supervising Provider    Answer:   Lilyan Punt A Lilyan Punt   Patient referred for CPAP with auto titration 8-20 through [facility]. Start Mounjaro as directed once a week.  We will start with the lowest dose and slowly titrate each month as needed.  Again note patient denies any family history of endocrine or thyroid cancer.  No personal history of pancreatitis. Nystatin powder to affected areas. Recheck  with Dr. [facility] next week as planned, call back sooner if needed.

## 2021-04-30 NOTE — Patient Instructions (Signed)
Victoza  Trulicity Ozmepic

## 2021-05-04 ENCOUNTER — Other Ambulatory Visit: Payer: Self-pay | Admitting: Nurse Practitioner

## 2021-05-04 MED ORDER — TIRZEPATIDE 2.5 MG/0.5ML ~~LOC~~ SOAJ: 2.5000 mg | SUBCUTANEOUS | 0 refills | Status: DC

## 2021-05-05 ENCOUNTER — Ambulatory Visit: Payer: BC Managed Care – PPO | Admitting: Family Medicine

## 2021-05-05 ENCOUNTER — Other Ambulatory Visit: Payer: Self-pay

## 2021-05-05 VITALS — BP 142/89 | HR 92 | Ht 65.0 in | Wt 271.6 lb

## 2021-05-05 DIAGNOSIS — Z23 Encounter for immunization: Secondary | ICD-10-CM | POA: Diagnosis not present

## 2021-05-05 DIAGNOSIS — I10 Essential (primary) hypertension: Secondary | ICD-10-CM | POA: Diagnosis not present

## 2021-05-05 DIAGNOSIS — E785 Hyperlipidemia, unspecified: Secondary | ICD-10-CM

## 2021-05-05 DIAGNOSIS — E1169 Type 2 diabetes mellitus with other specified complication: Secondary | ICD-10-CM

## 2021-05-05 DIAGNOSIS — E669 Obesity, unspecified: Secondary | ICD-10-CM

## 2021-05-05 MED ORDER — METFORMIN HCL ER 750 MG PO TB24: ORAL_TABLET | ORAL | 1 refills | Status: DC

## 2021-05-05 NOTE — Progress Notes (Signed)
Subjective:  Patient ID: Brianna [name], female    DOB: 1990-11-29  Age: 30 y.o. MRN: [id number]  CC: Chief Complaint  Patient presents with   Diabetes    Follow up Patient needs [facility] sent to [facility]- has not started med yet    HPI:  30 year old presents for follow-up.  DM-2 Uncontrolled.  Last A1c was 8.6. Is not checking her blood sugars at home. Currently on metformin and glipizide.  Was recently prescribed Mounjaro (has not yet started. Denies hypoglycemia.  HTN BP mildly elevated today. Currently on enalapril.  Denies side effects.  Doing well on this.  HLD Patient is not currently on statin therapy (likely due to age).  Last LDL was 118.  Patient Active Problem List   Diagnosis Date Noted   Obstructive sleep apnea 04/02/2021   Screen for STD (sexually transmitted disease) 11/09/2020   Hypothyroidism 12/09/2019   Hyperlipidemia associated with type 2 diabetes mellitus (HCC) 04/19/2017   HNP (herniated nucleus pulposus), lumbar 09/03/2014   Morbid obesity (HCC) 09/03/2014   Diabetes mellitus type 2 in obese (HCC) 09/03/2014   HTN (hypertension) 09/03/2014    Social Hx   Social History   Socioeconomic History   Marital status: Single    Spouse name: Not on file   Number of children: Not on file   Years of education: Not on file   Highest education level: Not on file  Occupational History   Not on file  Tobacco Use   Smoking status: Never   Smokeless tobacco: Never  Vaping Use   Vaping Use: Never used  Substance and Sexual Activity   Alcohol use: Yes   Drug use: No   Sexual activity: Yes    Birth control/protection: Pill  Other Topics Concern   Not on file  Social History Narrative   Not on file   Social Determinants of Health   Financial Resource Strain: Low Risk    Difficulty of Paying Living Expenses: Not hard at all  Food Insecurity: No Food Insecurity   Worried About Programme researcher, broadcasting/film/video in the Last Year: Never true    Ran Out of Food in the Last Year: Never true  Transportation Needs: No Transportation Needs   Lack of Transportation (Medical): No   Lack of Transportation (Non-Medical): No  Physical Activity: Insufficiently Active   Days of Exercise per Week: 2 days   Minutes of Exercise per Session: 30 min  Stress: No Stress Concern Present   Feeling of Stress : Only a little  Social Connections: Moderately Isolated   Frequency of Communication with Friends and Family: More than three times a week   Frequency of Social Gatherings with Friends and Family: Three times a week   Attends Religious Services: 1 to 4 times per year   Active Member of Clubs or Organizations: No   Attends Banker Meetings: Never   Marital Status: Never married    Review of Systems  Constitutional: Negative.   Respiratory: Negative.    Cardiovascular: Negative.   Gastrointestinal: Negative.     Objective:  BP (!) 142/89   Pulse 92   Ht 5\' 5"  (1.651 m)   Wt 271 lb 9.6 oz (123.2 kg)   SpO2 100%   BMI 45.20 kg/m   BP/Weight 05/05/2021 04/30/2021 03/31/2021  Systolic BP 142 137 131  Diastolic BP 89 91 80  Wt. (Lbs) 271.6 269.2 -  BMI 45.2 44.8 44.65    Physical Exam Vitals and nursing note reviewed.  Constitutional:      General: She is not in acute distress.    Appearance: Normal appearance. She is obese. She is not ill-appearing.  HENT:     Head: Normocephalic and atraumatic.  Cardiovascular:     Rate and Rhythm: Normal rate and regular rhythm.     Heart sounds: No murmur heard. Pulmonary:     Effort: Pulmonary effort is normal.     Breath sounds: Normal breath sounds. No wheezing, rhonchi or rales.  Neurological:     Mental Status: She is alert.  Psychiatric:        Mood and Affect: Mood normal.        Behavior: Behavior normal.    Lab Results  Component Value Date   WBC 6.5 12/25/2020   HGB 10.8 (L) 12/25/2020   HCT 33.5 (L) 12/25/2020   PLT 245 12/25/2020   GLUCOSE 212 (H)  12/25/2020   CHOL 171 12/25/2020   TRIG 39 12/25/2020   HDL 45 12/25/2020   LDLCALC 118 (H) 12/25/2020   ALT 16 12/25/2020   AST 10 12/25/2020   NA 139 12/25/2020   K 4.7 12/25/2020   CL 99 12/25/2020   CREATININE 0.56 (L) 12/25/2020   BUN 11 12/25/2020   CO2 19 (L) 12/25/2020   TSH 2.990 12/25/2020   HGBA1C 8.6 (H) 12/25/2020     Assessment & Plan:   Problem List Items Addressed This Visit       Cardiovascular and Mediastinum   HTN (hypertension)    Mildly elevated today. Will continue to monitor.  Continue enalapril.         Endocrine   Diabetes mellitus type 2 in obese (HCC)    Uncontrolled. Last A1C in June was 8.6. Starting [facility]. Rx has been sent in. Continue Metformin and Glipizide. Recheck A1C in 1-3 months (after starting [facility]).      Relevant Medications   metFORMIN (GLUCOPHAGE-XR) 750 MG 24 hr tablet   Hyperlipidemia associated with type 2 diabetes mellitus (HCC)    Will continue to advocate for diet & lifestyle changes and weight loss. Hopefully, Greggory Keen will aid in weight loss.      Relevant Medications   metFORMIN (GLUCOPHAGE-XR) 750 MG 24 hr tablet   Other Visit Diagnoses     Need for vaccination    -  Primary   Relevant Orders   Flu Vaccine QUAD 52mo+IM (Fluarix, Fluzone & Alfiuria Quad PF) (Completed)       Follow-up:  Return 1-3 months with Eber Jones.  Everlene Other DO [facility]

## 2021-05-05 NOTE — Patient Instructions (Signed)
Continue your current meds. Be sure to start the [facility].  I would schedule follow up with Eber Jones in 1-3 months (after starting the [facility]). A1C at that time.  Take care  Dr. Adriana Simas

## 2021-05-05 NOTE — Assessment & Plan Note (Signed)
Will continue to advocate for diet & lifestyle changes and weight loss. Hopefully, Greggory Keen will aid in weight loss.

## 2021-05-05 NOTE — Assessment & Plan Note (Signed)
Uncontrolled. Last A1C in June was 8.6. Starting [facility]. Rx has been sent in. Continue Metformin and Glipizide. Recheck A1C in 1-3 months (after starting [facility]).

## 2021-05-05 NOTE — Assessment & Plan Note (Signed)
Mildly elevated today. Will continue to monitor.  Continue enalapril.

## 2021-05-28 ENCOUNTER — Other Ambulatory Visit: Payer: Self-pay | Admitting: Nurse Practitioner

## 2021-05-28 MED ORDER — LEVOTHYROXINE SODIUM 25 MCG PO TABS: ORAL_TABLET | ORAL | 0 refills | Status: DC

## 2021-05-28 MED ORDER — GLIPIZIDE ER 10 MG PO TB24: 10.0000 mg | ORAL_TABLET | Freq: Every day | ORAL | 0 refills | Status: DC

## 2021-06-04 ENCOUNTER — Other Ambulatory Visit: Payer: Self-pay | Admitting: Nurse Practitioner

## 2021-06-04 ENCOUNTER — Encounter: Payer: Self-pay | Admitting: Nurse Practitioner

## 2021-06-04 MED ORDER — TIRZEPATIDE 5 MG/0.5ML ~~LOC~~ SOAJ: 5.0000 mg | SUBCUTANEOUS | 2 refills | Status: DC

## 2021-07-13 ENCOUNTER — Encounter: Payer: Self-pay | Admitting: Nurse Practitioner

## 2021-07-20 ENCOUNTER — Ambulatory Visit (INDEPENDENT_AMBULATORY_CARE_PROVIDER_SITE_OTHER): Payer: BC Managed Care – PPO | Admitting: *Deleted

## 2021-07-20 ENCOUNTER — Other Ambulatory Visit: Payer: Self-pay

## 2021-07-20 ENCOUNTER — Other Ambulatory Visit: Payer: Self-pay | Admitting: Women's Health

## 2021-07-20 VITALS — BP 140/82 | HR 114 | Ht 65.0 in | Wt 280.0 lb

## 2021-07-20 DIAGNOSIS — E119 Type 2 diabetes mellitus without complications: Secondary | ICD-10-CM

## 2021-07-20 DIAGNOSIS — Z3490 Encounter for supervision of normal pregnancy, unspecified, unspecified trimester: Secondary | ICD-10-CM

## 2021-07-20 DIAGNOSIS — N926 Irregular menstruation, unspecified: Secondary | ICD-10-CM | POA: Diagnosis not present

## 2021-07-20 DIAGNOSIS — Z3201 Encounter for pregnancy test, result positive: Secondary | ICD-10-CM | POA: Diagnosis not present

## 2021-07-20 LAB — POCT URINE PREGNANCY: Preg Test, Ur: POSITIVE — AB

## 2021-07-20 MED ORDER — LABETALOL HCL 200 MG PO TABS: 200.0000 mg | ORAL_TABLET | Freq: Two times a day (BID) | ORAL | 3 refills | Status: DC

## 2021-07-20 NOTE — Progress Notes (Addendum)
° °  NURSE VISIT- PREGNANCY CONFIRMATION   SUBJECTIVE:  Brianna [name] is a 30 y.o. G1P0000 female at [redacted]w[redacted]d by certain LMP of Patient's last menstrual period was 06/10/2021. Here for pregnancy confirmation.  Home pregnancy test: positive x 1   She reports no complaints.  She is not taking prenatal vitamins.    OBJECTIVE:  BP 140/82 (BP Location: Left Arm, Patient Position: Sitting, Cuff Size: Large)    Pulse (!) 114    Ht 5\' 5"  (1.651 m)    Wt 280 lb (127 kg)    LMP 06/10/2021    BMI 46.59 kg/m   Appears well, in no apparent distress  Results for orders placed or performed in visit on 07/20/21 (from the past 24 hour(s))  POCT urine pregnancy   Collection Time: 07/20/21  4:01 PM  Result Value Ref Range   Preg Test, Ur Positive (A) Negative    ASSESSMENT: Positive pregnancy test, [redacted]w[redacted]d by LMP    PLAN: Schedule for dating ultrasound in [redacted] weeks along with BP check with nurse Prenatal vitamins: plans to begin OTC ASAP   Nausea medicines: not currently needed.   OB packet given: Yes Advised to stop taking Vasotec and will begin Labetalol 200mg  BID.  A1C ordered today.  [redacted]w[redacted]d  07/20/2021 4:31 PM  Chart reviewed for nurse visit. Agree with plan of care.  Jobe Marker, 07/22/2021 07/20/2021 4:54 PM

## 2021-07-21 LAB — HEMOGLOBIN A1C
Est. average glucose Bld gHb Est-mCnc: 117 mg/dL
Hgb A1c MFr Bld: 5.7 % — ABNORMAL HIGH (ref 4.8–5.6)

## 2021-07-26 ENCOUNTER — Other Ambulatory Visit: Payer: Self-pay | Admitting: Nurse Practitioner

## 2021-07-28 ENCOUNTER — Other Ambulatory Visit: Payer: Self-pay

## 2021-08-09 ENCOUNTER — Other Ambulatory Visit: Payer: Self-pay | Admitting: Obstetrics & Gynecology

## 2021-08-09 DIAGNOSIS — O3680X0 Pregnancy with inconclusive fetal viability, not applicable or unspecified: Secondary | ICD-10-CM

## 2021-08-10 ENCOUNTER — Ambulatory Visit (INDEPENDENT_AMBULATORY_CARE_PROVIDER_SITE_OTHER): Payer: BC Managed Care – PPO | Admitting: *Deleted

## 2021-08-10 ENCOUNTER — Ambulatory Visit (INDEPENDENT_AMBULATORY_CARE_PROVIDER_SITE_OTHER): Payer: BC Managed Care – PPO

## 2021-08-10 ENCOUNTER — Encounter: Payer: Self-pay | Admitting: *Deleted

## 2021-08-10 ENCOUNTER — Other Ambulatory Visit: Payer: Self-pay

## 2021-08-10 VITALS — BP 149/95 | HR 89 | Ht 65.0 in | Wt 280.0 lb

## 2021-08-10 DIAGNOSIS — Z3A08 8 weeks gestation of pregnancy: Secondary | ICD-10-CM

## 2021-08-10 DIAGNOSIS — O3680X0 Pregnancy with inconclusive fetal viability, not applicable or unspecified: Secondary | ICD-10-CM | POA: Diagnosis not present

## 2021-08-10 DIAGNOSIS — Z0130 Encounter for examination of blood pressure without abnormal findings: Secondary | ICD-10-CM

## 2021-08-10 MED ORDER — LABETALOL HCL 300 MG PO TABS: 300.0000 mg | ORAL_TABLET | Freq: Two times a day (BID) | ORAL | 6 refills | Status: DC

## 2021-08-10 NOTE — Progress Notes (Addendum)
° °  NURSE VISIT- BLOOD PRESSURE CHECK  SUBJECTIVE:  Brianna [name] is a 31 y.o. G59P0000 female here for BP check. She is [redacted]w[redacted]d pregnant    HYPERTENSION ROS:  Pregnant/postpartum:  Severe headaches that don't go away with tylenol/other medicines: No  Visual changes (seeing spots/double/blurred vision) No  Severe pain under right breast breast or in center of upper chest No  Severe nausea/vomiting No  Taking medicines as instructed yes    OBJECTIVE:  BP (!) 149/95 (BP Location: Right Arm, Patient Position: Sitting, Cuff Size: Large)    Pulse 89    Ht 5\' 5"  (1.651 m)    Wt 280 lb (127 kg)    LMP 06/10/2021    BMI 46.59 kg/m   Appearance alert, well appearing, and in no distress. Pt's first BP reading was 158/101 pulse 89.  ASSESSMENT: Pregnancy [redacted]w[redacted]d  blood pressure check  PLAN: Discussed with [redacted]w[redacted]d, CNM, [facility]   Recommendations: new prescription will be sent. Labetalol is going to be increased to 300 mg BID.  Follow-up:  Friday for BP check with nurse.   Take BP med 1 hour before appt on Friday.   Thursday  08/10/2021 12:00 PM  Chart reviewed for nurse visit. Agree with plan of care. Increase labetalol to 300mg  BID, f/u Friday for bp check w/ nurse, take med at least 1hr prior to appt , CNM 08/10/2021 12:03 PM

## 2021-08-10 NOTE — Addendum Note (Signed)
Addended by: Shawna Clamp R on: 08/10/2021 12:04 PM   Modules accepted: Orders

## 2021-08-10 NOTE — Progress Notes (Signed)
[place] 8+5 wks,single IUP with yolk sac,CRL 21.16 mm,normal ovaries,FHR 174 BPM

## 2021-08-13 ENCOUNTER — Encounter: Payer: Self-pay | Admitting: *Deleted

## 2021-08-13 ENCOUNTER — Ambulatory Visit (INDEPENDENT_AMBULATORY_CARE_PROVIDER_SITE_OTHER): Payer: BC Managed Care – PPO | Admitting: *Deleted

## 2021-08-13 ENCOUNTER — Other Ambulatory Visit: Payer: Self-pay

## 2021-08-13 VITALS — BP 138/90 | HR 93 | Ht 65.0 in | Wt 278.0 lb

## 2021-08-13 DIAGNOSIS — Z0130 Encounter for examination of blood pressure without abnormal findings: Secondary | ICD-10-CM

## 2021-08-13 NOTE — Progress Notes (Addendum)
° °  NURSE VISIT- BLOOD PRESSURE CHECK  SUBJECTIVE:  Brianna [name] is a 31 y.o. G21P0000 female here for BP check. She is [redacted]w[redacted]d pregnant    HYPERTENSION ROS:  Pregnant/postpartum:  Severe headaches that don't go away with tylenol/other medicines: No  Visual changes (seeing spots/double/blurred vision) Yes , when she had a headache Severe pain under right breast breast or in center of upper chest No  Severe nausea/vomiting No  Taking medicines as instructed yes    OBJECTIVE:  BP 138/90 (BP Location: Left Arm, Patient Position: Sitting, Cuff Size: Large)    Pulse 93    Ht 5\' 5"  (1.651 m)    Wt 278 lb (126.1 kg)    LMP 06/10/2021    BMI 46.26 kg/m   Appearance alert, well appearing, and in no distress.  ASSESSMENT: Pregnancy [redacted]w[redacted]d  blood pressure check  PLAN: Discussed with [redacted]w[redacted]d, CNM, [facility]   Recommendations: no changes needed   Follow-up: as scheduled   [facility]  08/13/2021 10:51 AM  Chart reviewed for nurse visit. Agree with plan of care.  08/15/2021, Cheral Marker 08/13/2021 11:03 AM

## 2021-08-23 ENCOUNTER — Encounter: Payer: Self-pay | Admitting: Women's Health

## 2021-09-03 ENCOUNTER — Encounter: Payer: Self-pay | Admitting: Women's Health

## 2021-09-06 ENCOUNTER — Other Ambulatory Visit: Payer: Self-pay

## 2021-09-06 ENCOUNTER — Other Ambulatory Visit (HOSPITAL_COMMUNITY)
Admission: RE | Admit: 2021-09-06 | Discharge: 2021-09-06 | Disposition: A | Discharge status: Home / Self Care | Payer: BC Managed Care – PPO | Source: Ambulatory Visit | Attending: Obstetrics & Gynecology | Admitting: Obstetrics & Gynecology

## 2021-09-06 ENCOUNTER — Other Ambulatory Visit (INDEPENDENT_AMBULATORY_CARE_PROVIDER_SITE_OTHER): Payer: BC Managed Care – PPO

## 2021-09-06 VITALS — BP 169/93 | HR 97

## 2021-09-06 DIAGNOSIS — Z113 Encounter for screening for infections with a predominantly sexual mode of transmission: Secondary | ICD-10-CM | POA: Diagnosis not present

## 2021-09-06 DIAGNOSIS — N898 Other specified noninflammatory disorders of vagina: Secondary | ICD-10-CM | POA: Insufficient documentation

## 2021-09-06 NOTE — Progress Notes (Incomplete)
° °  NURSE VISIT- VAGINITIS/STD  SUBJECTIVE:  Brianna [name] is a 31 y.o. G1P0000 [redacted]w[redacted]d pregnantfemale here for a vaginal swab for vaginitis screening, STD screen.  She reports the following symptoms: odor and discharge for 1 week. Denies abnormal vaginal bleeding, significant pelvic pain, fever, or UTI symptoms.  OBJECTIVE:  BP (!) 169/93 (BP Location: Right Arm, Patient Position: Sitting, Cuff Size: Large)    Pulse 97    LMP 06/10/2021   Appears well, in no apparent distress BP elevated today. States she just took her medication 20 minutes prior to getting to our office.   ASSESSMENT: Vaginal swab for vaginitis screening/STD screen  PLAN: Self-collected vaginal probe for Gonorrhea, Chlamydia, Trichomonas, Bacterial Vaginosis, Yeast sent to lab Treatment: to be determined once results are received Follow-up as needed if symptoms persist/worsen, or new symptoms develop Advised to start checking blood pressure twice daily   Brianna [name]  09/06/2021 9:38 AM

## 2021-09-08 ENCOUNTER — Other Ambulatory Visit: Payer: Self-pay | Admitting: Adult Health

## 2021-09-08 LAB — CERVICOVAGINAL ANCILLARY ONLY
Bacterial Vaginitis (gardnerella): POSITIVE — AB
Candida Glabrata: NEGATIVE
Candida Vaginitis: NEGATIVE
Chlamydia: NEGATIVE
Comment: NEGATIVE
Comment: NEGATIVE
Comment: NEGATIVE
Comment: NEGATIVE
Comment: NEGATIVE
Comment: NORMAL
Neisseria Gonorrhea: NEGATIVE
Trichomonas: NEGATIVE

## 2021-09-08 MED ORDER — METRONIDAZOLE 500 MG PO TABS: 500.0000 mg | ORAL_TABLET | Freq: Two times a day (BID) | ORAL | 0 refills | Status: DC

## 2021-09-08 NOTE — Progress Notes (Signed)
Vaginal swab +BV, rx sent in for flagyl, no sex or alcohol during treatment

## 2021-09-10 ENCOUNTER — Other Ambulatory Visit: Payer: Self-pay | Admitting: Obstetrics & Gynecology

## 2021-09-10 DIAGNOSIS — Z3682 Encounter for antenatal screening for nuchal translucency: Secondary | ICD-10-CM

## 2021-09-13 ENCOUNTER — Encounter: Payer: Self-pay | Admitting: Women's Health

## 2021-09-13 ENCOUNTER — Ambulatory Visit (INDEPENDENT_AMBULATORY_CARE_PROVIDER_SITE_OTHER): Payer: BC Managed Care – PPO

## 2021-09-13 ENCOUNTER — Encounter: Payer: BC Managed Care – PPO | Admitting: Women's Health

## 2021-09-13 ENCOUNTER — Ambulatory Visit: Payer: BC Managed Care – PPO | Admitting: *Deleted

## 2021-09-13 ENCOUNTER — Ambulatory Visit (INDEPENDENT_AMBULATORY_CARE_PROVIDER_SITE_OTHER): Payer: BC Managed Care – PPO | Admitting: Women's Health

## 2021-09-13 ENCOUNTER — Other Ambulatory Visit: Payer: Self-pay

## 2021-09-13 VITALS — BP 149/91 | HR 93 | Wt 276.4 lb

## 2021-09-13 DIAGNOSIS — O0993 Supervision of high risk pregnancy, unspecified, third trimester: Secondary | ICD-10-CM | POA: Diagnosis not present

## 2021-09-13 DIAGNOSIS — E1169 Type 2 diabetes mellitus with other specified complication: Secondary | ICD-10-CM

## 2021-09-13 DIAGNOSIS — E039 Hypothyroidism, unspecified: Secondary | ICD-10-CM

## 2021-09-13 DIAGNOSIS — Z31430 Encounter of female for testing for genetic disease carrier status for procreative management: Secondary | ICD-10-CM | POA: Diagnosis not present

## 2021-09-13 DIAGNOSIS — O288 Other abnormal findings on antenatal screening of mother: Secondary | ICD-10-CM | POA: Diagnosis not present

## 2021-09-13 DIAGNOSIS — I10 Essential (primary) hypertension: Secondary | ICD-10-CM

## 2021-09-13 DIAGNOSIS — Z3682 Encounter for antenatal screening for nuchal translucency: Secondary | ICD-10-CM | POA: Diagnosis not present

## 2021-09-13 DIAGNOSIS — E669 Obesity, unspecified: Secondary | ICD-10-CM

## 2021-09-13 DIAGNOSIS — Z3481 Encounter for supervision of other normal pregnancy, first trimester: Secondary | ICD-10-CM | POA: Diagnosis not present

## 2021-09-13 DIAGNOSIS — E119 Type 2 diabetes mellitus without complications: Secondary | ICD-10-CM | POA: Diagnosis not present

## 2021-09-13 DIAGNOSIS — O0990 Supervision of high risk pregnancy, unspecified, unspecified trimester: Secondary | ICD-10-CM | POA: Insufficient documentation

## 2021-09-13 DIAGNOSIS — O10919 Unspecified pre-existing hypertension complicating pregnancy, unspecified trimester: Secondary | ICD-10-CM | POA: Diagnosis not present

## 2021-09-13 DIAGNOSIS — Z3A13 13 weeks gestation of pregnancy: Secondary | ICD-10-CM

## 2021-09-13 DIAGNOSIS — O0991 Supervision of high risk pregnancy, unspecified, first trimester: Secondary | ICD-10-CM | POA: Diagnosis not present

## 2021-09-13 DIAGNOSIS — O99891 Other specified diseases and conditions complicating pregnancy: Secondary | ICD-10-CM

## 2021-09-13 DIAGNOSIS — R8271 Bacteriuria: Secondary | ICD-10-CM

## 2021-09-13 DIAGNOSIS — Z3A35 35 weeks gestation of pregnancy: Secondary | ICD-10-CM | POA: Diagnosis not present

## 2021-09-13 DIAGNOSIS — O0992 Supervision of high risk pregnancy, unspecified, second trimester: Secondary | ICD-10-CM

## 2021-09-13 LAB — POCT URINALYSIS DIPSTICK OB
Blood, UA: NEGATIVE
Glucose, UA: NEGATIVE
Ketones, UA: NEGATIVE
Leukocytes, UA: NEGATIVE
Nitrite, UA: NEGATIVE
POC,PROTEIN,UA: NEGATIVE

## 2021-09-13 MED ORDER — LABETALOL HCL 200 MG PO TABS: 400.0000 mg | ORAL_TABLET | Freq: Two times a day (BID) | ORAL | 6 refills | Status: DC

## 2021-09-13 MED ORDER — ASPIRIN 81 MG PO TBEC: 162.0000 mg | DELAYED_RELEASE_TABLET | Freq: Every day | ORAL | 2 refills | Status: DC

## 2021-09-13 NOTE — Progress Notes (Signed)
INITIAL OBSTETRICAL VISIT Patient name: Brianna [name] MRN [id number]  Date of birth: [birth year] Chief Complaint:   Initial Prenatal Visit  History of Present Illness:   Brianna [name] is a 31 y.o. G33P0000 African-American female at [redacted]w[redacted]d by LMP c/w u/s at 8 weeks with an Estimated Date of Delivery: 03/17/22 being seen today for her initial obstetrical visit.   Patient's last menstrual period was 06/10/2021. Her obstetrical history is significant for primigravida.   Today she reports no complaints.  Home bp's 140s-150s/90s.  Dx w/ DM sometime after HS, last eye exam last year, rx went up some, but no mention of retinopathy. No cardiac/renal problems she is aware of.  Last pap 08/21/20. Results were: NILM w/ HRHPV not done  Depression screen [facility] 2/9 09/13/2021 05/05/2021 04/30/2021 12/25/2020 11/30/2020  Decreased Interest 0 0 0 0 0  Down, Depressed, Hopeless 0 0 0 0 0  PHQ - 2 Score 0 0 0 0 0  Altered sleeping 1 - - - -  Tired, decreased energy 1 - - - -  Change in appetite 0 - - - -  Feeling bad or failure about yourself  0 - - - -  Trouble concentrating 0 - - - -  Moving slowly or fidgety/restless 0 - - - -  Suicidal thoughts 0 - - - -  PHQ-9 Score 2 - - - -  Difficult doing work/chores - - - - -     GAD 7 : Generalized Anxiety Score 09/13/2021 11/19/2020  Nervous, Anxious, on Edge 0 0  Control/stop worrying 0 0  Worry too much - different things 0 0  Trouble relaxing 0 0  Restless 0 0  Easily annoyed or irritable 1 0  Afraid - awful might happen 0 0  Total GAD 7 Score 1 0     Review of Systems:   Pertinent items are noted in HPI Denies cramping/contractions, leakage of fluid, vaginal bleeding, abnormal vaginal discharge w/ itching/odor/irritation, headaches, visual changes, shortness of breath, chest pain, abdominal pain, severe nausea/vomiting, or problems with urination or bowel movements unless otherwise stated above.  Pertinent History Reviewed:  Reviewed past  medical,surgical, social, obstetrical and family history.  Reviewed problem list, medications and allergies. OB History  Gravida Para Term Preterm AB Living  1 0 0 0 0 0  SAB IAB Ectopic Multiple Live Births  0 0 0 0 0    # Outcome Date GA Lbr Len/2nd Weight Sex Delivery Anes PTL Lv  1 Current            Physical Assessment:   Vitals:   09/13/21 1027  BP: (!) 149/91  Pulse: 93  Weight: 276 lb 6.4 oz (125.4 kg)  Body mass index is 46 kg/m.       Physical Examination:  General appearance - well appearing, and in no distress  Mental status - alert, oriented to person, place, and time  Psych:  She has a normal mood and affect  Skin - warm and dry, normal color, no suspicious lesions noted  Chest - effort normal, all lung fields clear to auscultation bilaterally  Heart - normal rate and regular rhythm  Abdomen - soft, nontender  Extremities:  No swelling or varicosities noted  Thin prep pap is not done   Chaperone: N/A    TODAY'S NT [place] 13+4 wks,measurements c/w dates,CRL 76.94 mm,NB present,NT 2.1 mm,FHR 158 bpm,normal ovaries,posterior placenta,limited view because of fetal position   Results for orders placed or performed in visit  on 09/13/21 (from the past 24 hour(s))  POC Urinalysis Dipstick OB   Collection Time: 09/13/21 11:05 AM  Result Value Ref Range   Color, UA     Clarity, UA     Glucose, UA Negative Negative   Bilirubin, UA     Ketones, UA neg    Spec Grav, UA     Blood, UA neg    pH, UA     POC,PROTEIN,UA Negative Negative, Trace, Small (1+), Moderate (2+), Large (3+), 4+   Urobilinogen, UA     Nitrite, UA neg    Leukocytes, UA Negative Negative   Appearance     Odor      Assessment & Plan:  1) High-Risk Pregnancy G1P0000 at [redacted]w[redacted]d with an Estimated Date of Delivery: 03/17/22   2) Initial OB visit  3) CHTN> discussed w/ Dr. Nelda Marseille, increase labetalol to 400mg  BID, ASA 162mg  daily, baseline labs today, f/u 1wk for bp check w/ nurse, take meds at least 1hr  prior to appt  4) T2DM> last A1C 5.7 (07/20/21), per Dr. Nelda Marseille ok to continue glipizide XL 10mg  q am, metformin XR 750mg  daily, note routed to [facility] to order dietician consult, start checking sugars QID, discussed goals and gave printed info  5) Hypothyroidism> on synthroid 68mcg, check TSH today and q trimester   Meds:  Meds ordered this encounter  Medications   aspirin 81 MG EC tablet    Sig: Take 2 tablets (162 mg total) by mouth daily. Swallow whole.    Dispense:  180 tablet    Refill:  2    Order Specific Question:   Supervising Provider    Answer:   Tania Ade H [[id number]]   labetalol (NORMODYNE) 200 MG tablet    Sig: Take 2 tablets (400 mg total) by mouth 2 (two) times daily.    Dispense:  120 tablet    Refill:  6    Order Specific Question:   Supervising Provider    Answer:   Tania Ade H [[id number]]    Initial labs obtained Continue prenatal vitamins Reviewed n/v relief measures and warning s/s to report Reviewed recommended weight gain based on pre-gravid BMI Encouraged well-balanced diet Genetic & carrier screening discussed: requests Panorama, NT/IT, and Horizon  Ultrasound discussed; fetal survey: requested CCNC completed> form faxed if has or is planning to apply for medicaid The nature of [place] for [employer] with multiple MDs and other Advanced Practice Providers was explained to patient; also emphasized that fellows, residents, and students are part of our team. Does have home bp cuff. Office bp cuff given: no. Rx sent: no. Check bp weekly, let us know if consistently >140/90.   Follow-up: Return in about 1 week (around 09/20/2021) for nurse bp check; then 3wks from now for [place] w/ MD and 2nd IT.   Orders Placed This Encounter  Procedures   Urine Culture   GC/Chlamydia Probe Amp   Protein / creatinine ratio, urine   Comprehensive metabolic panel   Genetic Screening   CBC/D/Plt+RPR+Rh+ABO+RubIgG...   TSH   Integrated 1   Referral to  Nutrition and Diabetes Services   POC Urinalysis Dipstick OB    Roma Schanz CNM, WHNP-BC 09/13/2021 1:10 PM

## 2021-09-13 NOTE — Patient Instructions (Addendum)
Brianna [name], thank you for choosing our office today! We appreciate the opportunity to meet your healthcare needs. You may receive a short survey by mail, e-mail, or through [employer]. If you are happy with your care we would appreciate if you could take just a few minutes to complete the survey questions. We read all of your comments and take your feedback very seriously. Thank you again for choosing our office.  Center for [facility] at [facility]  [facility] at [facility] ([address] [place], [place] [id number]) Entrance C, located off of [address] Free 24/7 valet parking   Check blood sugars 4 times a day: in the morning before eating/drinking anything (goal is <95) and 2 hours after your first bite of breakfast, lunch, and supper (goal is <120). Please keep a log (Example 12/21/20: 95, 120, 120, 120) and bring to each appointment.    Nausea & Vomiting Have saltine crackers or pretzels by your bed and eat a few bites before you raise your head out of bed in the morning Eat small frequent meals throughout the day instead of large meals Drink plenty of fluids throughout the day to stay hydrated, just don't drink a lot of fluids with your meals.  This can make your stomach fill up faster making you feel sick Do not brush your teeth right after you eat Products with real ginger are good for nausea, like ginger ale and ginger hard candy Make sure it says made with real ginger! Sucking on sour candy like lemon heads is also good for nausea If your prenatal vitamins make you nauseated, take them at night so you will sleep through the nausea Sea Bands If you feel like you need medicine for the nausea & vomiting please let us know If you are unable to keep any fluids or food down please let us know   Constipation Drink plenty of fluid, preferably water, throughout the day Eat foods high in fiber such as fruits, vegetables, and grains Exercise, such as walking, is a good  way to keep your bowels regular Drink warm fluids, especially warm prune juice, or decaf coffee Eat a 1/2 cup of real oatmeal (not instant), 1/2 cup applesauce, and 1/2-1 cup warm prune juice every day If needed, you may take Colace (docusate sodium) stool softener once or twice a day to help keep the stool soft.  If you still are having problems with constipation, you may take Miralax once daily as needed to help keep your bowels regular.   Home Blood Pressure Monitoring for Patients   Your provider has recommended that you check your blood pressure (BP) at least once a week at home. If you do not have a blood pressure cuff at home, one will be provided for you. Contact your provider if you have not received your monitor within 1 week.   Helpful Tips for Accurate Home Blood Pressure Checks  Don't smoke, exercise, or drink caffeine 30 minutes before checking your BP Use the restroom before checking your BP (a full bladder can raise your pressure) Relax in a comfortable upright chair Feet on the ground Left arm resting comfortably on a flat surface at the level of your heart Legs uncrossed Back supported Sit quietly and don't talk Place the cuff on your bare arm Adjust snuggly, so that only two fingertips can fit between your skin and the top of the cuff Check 2 readings separated by at least one minute Keep a log  of your BP readings For a visual, please reference this diagram: http://ccnc.care/bpdiagram  Provider Name: [facility]     Phone: [phone]  Zone 1: ALL CLEAR  Continue to monitor your symptoms:  BP reading is less than 140 (top number) or less than 90 (bottom number)  No right upper stomach pain No headaches or seeing spots No feeling nauseated or throwing up No swelling in face and hands  Zone 2: CAUTION Call your doctor's office for any of the following:  BP reading is greater than 140 (top number) or greater than 90 (bottom number)  Stomach pain under  your ribs in the middle or right side Headaches or seeing spots Feeling nauseated or throwing up Swelling in face and hands  Zone 3: EMERGENCY  Seek immediate medical care if you have any of the following:  BP reading is greater than160 (top number) or greater than 110 (bottom number) Severe headaches not improving with Tylenol Serious difficulty catching your breath Any worsening symptoms from Zone 2    First Trimester of Pregnancy The first trimester of pregnancy is from week 1 until the end of week 12 (months 1 through 3). A week after a sperm fertilizes an egg, the egg will implant on the wall of the uterus. This embryo will begin to develop into a baby. Genes from you and your partner are forming the baby. The female genes determine whether the baby is a boy or a girl. At 6-8 weeks, the eyes and face are formed, and the heartbeat can be seen on ultrasound. At the end of 12 weeks, all the baby's organs are formed.  Now that you are pregnant, you will want to do everything you can to have a healthy baby. Two of the most important things are to get good prenatal care and to follow your health care provider's instructions. Prenatal care is all the medical care you receive before the baby's birth. This care will help prevent, find, and treat any problems during the pregnancy and childbirth. BODY CHANGES Your body goes through many changes during pregnancy. The changes vary from woman to woman.  You may gain or lose a couple of pounds at first. You may feel sick to your stomach (nauseous) and throw up (vomit). If the vomiting is uncontrollable, call your health care provider. You may tire easily. You may develop headaches that can be relieved by medicines approved by your health care provider. You may urinate more often. Painful urination may mean you have a bladder infection. You may develop heartburn as a result of your pregnancy. You may develop constipation because certain hormones are  causing the muscles that push waste through your intestines to slow down. You may develop hemorrhoids or swollen, bulging veins (varicose veins). Your breasts may begin to grow larger and become tender. Your nipples may stick out more, and the tissue that surrounds them (areola) may become darker. Your gums may bleed and may be sensitive to brushing and flossing. Dark spots or blotches (chloasma, mask of pregnancy) may develop on your face. This will likely fade after the baby is born. Your menstrual periods will stop. You may have a loss of appetite. You may develop cravings for certain kinds of food. You may have changes in your emotions from day to day, such as being excited to be pregnant or being concerned that something may go wrong with the pregnancy and baby. You may have more vivid and strange dreams. You may have changes in your hair.  These can include thickening of your hair, rapid growth, and changes in texture. Some women also have hair loss during or after pregnancy, or hair that feels dry or thin. Your hair will most likely return to normal after your baby is born. WHAT TO EXPECT AT YOUR PRENATAL VISITS During a routine prenatal visit: You will be weighed to make sure you and the baby are growing normally. Your blood pressure will be taken. Your abdomen will be measured to track your baby's growth. The fetal heartbeat will be listened to starting around week 10 or 12 of your pregnancy. Test results from any previous visits will be discussed. Your health care provider may ask you: How you are feeling. If you are feeling the baby move. If you have had any abnormal symptoms, such as leaking fluid, bleeding, severe headaches, or abdominal cramping. If you have any questions. Other tests that may be performed during your first trimester include: Blood tests to find your blood type and to check for the presence of any previous infections. They will also be used to check for low iron  levels (anemia) and Rh antibodies. Later in the pregnancy, blood tests for diabetes will be done along with other tests if problems develop. Urine tests to check for infections, diabetes, or protein in the urine. An ultrasound to confirm the proper growth and development of the baby. An amniocentesis to check for possible genetic problems. Fetal screens for spina bifida and Down syndrome. You may need other tests to make sure you and the baby are doing well. HOME CARE INSTRUCTIONS  Medicines Follow your health care provider's instructions regarding medicine use. Specific medicines may be either safe or unsafe to take during pregnancy. Take your prenatal vitamins as directed. If you develop constipation, try taking a stool softener if your health care provider approves. Diet Eat regular, well-balanced meals. Choose a variety of foods, such as meat or vegetable-based protein, fish, milk and low-fat dairy products, vegetables, fruits, and whole grain breads and cereals. Your health care provider will help you determine the amount of weight gain that is right for you. Avoid raw meat and uncooked cheese. These carry germs that can cause birth defects in the baby. Eating four or five small meals rather than three large meals a day may help relieve nausea and vomiting. If you start to feel nauseous, eating a few soda crackers can be helpful. Drinking liquids between meals instead of during meals also seems to help nausea and vomiting. If you develop constipation, eat more high-fiber foods, such as fresh vegetables or fruit and whole grains. Drink enough fluids to keep your urine clear or pale yellow. Activity and Exercise Exercise only as directed by your health care provider. Exercising will help you: Control your weight. Stay in shape. Be prepared for labor and delivery. Experiencing pain or cramping in the lower abdomen or low back is a good sign that you should stop exercising. Check with your  health care provider before continuing normal exercises. Try to avoid standing for long periods of time. Move your legs often if you must stand in one place for a long time. Avoid heavy lifting. Wear low-heeled shoes, and practice good posture. You may continue to have sex unless your health care provider directs you otherwise. Relief of Pain or Discomfort Wear a good support bra for breast tenderness.   Take warm sitz baths to soothe any pain or discomfort caused by hemorrhoids. Use hemorrhoid cream if your health care provider approves.  Rest with your legs elevated if you have leg cramps or low back pain. If you develop varicose veins in your legs, wear support hose. Elevate your feet for 15 minutes, 3-4 times a day. Limit salt in your diet. Prenatal Care Schedule your prenatal visits by the twelfth week of pregnancy. They are usually scheduled monthly at first, then more often in the last 2 months before delivery. Write down your questions. Take them to your prenatal visits. Keep all your prenatal visits as directed by your health care provider. Safety Wear your seat belt at all times when driving. Make a list of emergency phone numbers, including numbers for family, friends, the hospital, and police and fire departments. General Tips Ask your health care provider for a referral to a local prenatal education class. Begin classes no later than at the beginning of month 6 of your pregnancy. Ask for help if you have counseling or nutritional needs during pregnancy. Your health care provider can offer advice or refer you to specialists for help with various needs. Do not use hot tubs, steam rooms, or saunas. Do not douche or use tampons or scented sanitary pads. Do not cross your legs for long periods of time. Avoid cat litter boxes and soil used by cats. These carry germs that can cause birth defects in the baby and possibly loss of the fetus by miscarriage or stillbirth. Avoid all smoking,  herbs, alcohol, and medicines not prescribed by your health care provider. Chemicals in these affect the formation and growth of the baby. Schedule a dentist appointment. At home, brush your teeth with a soft toothbrush and be gentle when you floss. SEEK MEDICAL CARE IF:  You have dizziness. You have mild pelvic cramps, pelvic pressure, or nagging pain in the abdominal area. You have persistent nausea, vomiting, or diarrhea. You have a bad smelling vaginal discharge. You have pain with urination. You notice increased swelling in your face, hands, legs, or ankles. SEEK IMMEDIATE MEDICAL CARE IF:  You have a fever. You are leaking fluid from your vagina. You have spotting or bleeding from your vagina. You have severe abdominal cramping or pain. You have rapid weight gain or loss. You vomit blood or material that looks like coffee grounds. You are exposed to [place] measles and have never had them. You are exposed to fifth disease or chickenpox. You develop a severe headache. You have shortness of breath. You have any kind of trauma, such as from a fall or a car accident. Document Released: 07/05/2001 Document Revised: 11/25/2013 Document Reviewed: 05/21/2013 [facility] Patient Information 2015 [place], [place]. This information is not intended to replace advice given to you by your health care provider. Make sure you discuss any questions you have with your health care provider.

## 2021-09-13 NOTE — Progress Notes (Signed)
[place] 13+4 wks,measurements c/w dates,CRL 76.94 mm,NB present,NT 2.1 mm,FHR 158 bpm,normal ovaries,posterior placenta,limited view because of fetal position

## 2021-09-14 LAB — COMPREHENSIVE METABOLIC PANEL
ALT: 13 IU/L (ref 0–32)
AST: 11 IU/L (ref 0–40)
Albumin/Globulin Ratio: 1.5 (ref 1.2–2.2)
Albumin: 4.3 g/dL (ref 3.9–5.0)
Alkaline Phosphatase: 53 IU/L (ref 44–121)
BUN/Creatinine Ratio: 10 (ref 9–23)
BUN: 5 mg/dL — ABNORMAL LOW (ref 6–20)
Bilirubin Total: 0.3 mg/dL (ref 0.0–1.2)
CO2: 20 mmol/L (ref 20–29)
Calcium: 9.4 mg/dL (ref 8.7–10.2)
Chloride: 102 mmol/L (ref 96–106)
Creatinine, Ser: 0.49 mg/dL — ABNORMAL LOW (ref 0.57–1.00)
Globulin, Total: 2.8 g/dL (ref 1.5–4.5)
Glucose: 80 mg/dL (ref 70–99)
Potassium: 4.1 mmol/L (ref 3.5–5.2)
Sodium: 135 mmol/L (ref 134–144)
Total Protein: 7.1 g/dL (ref 6.0–8.5)
eGFR: 130 mL/min/{1.73_m2} (ref >59)

## 2021-09-14 LAB — CBC/D/PLT+RPR+RH+ABO+RUBIGG...
Antibody Screen: NEGATIVE
Basophils Absolute: 0 10*3/uL (ref 0.0–0.2)
Basos: 0 %
EOS (ABSOLUTE): 0.1 10*3/uL (ref 0.0–0.4)
Eos: 2 %
HCV Ab: NONREACTIVE
HIV Screen 4th Generation wRfx: NONREACTIVE
Hematocrit: 29.3 % — ABNORMAL LOW (ref 34.0–46.6)
Hemoglobin: 9.6 g/dL — ABNORMAL LOW (ref 11.1–15.9)
Hepatitis B Surface Ag: NEGATIVE
Immature Grans (Abs): 0 10*3/uL (ref 0.0–0.1)
Immature Granulocytes: 0 %
Lymphocytes Absolute: 1 10*3/uL (ref 0.7–3.1)
Lymphs: 20 %
MCH: 27.6 pg (ref 26.6–33.0)
MCHC: 32.8 g/dL (ref 31.5–35.7)
MCV: 84 fL (ref 79–97)
Monocytes Absolute: 0.5 10*3/uL (ref 0.1–0.9)
Monocytes: 10 %
Neutrophils Absolute: 3.5 10*3/uL (ref 1.4–7.0)
Neutrophils: 68 %
Platelets: 209 10*3/uL (ref 150–450)
RBC: 3.48 x10E6/uL — ABNORMAL LOW (ref 3.77–5.28)
RDW: 14.4 % (ref 11.7–15.4)
RPR Ser Ql: NONREACTIVE
Rh Factor: POSITIVE
Rubella Antibodies, IGG: 1.8 index (ref >0.99)
WBC: 5.1 10*3/uL (ref 3.4–10.8)

## 2021-09-14 LAB — HCV INTERPRETATION

## 2021-09-14 LAB — TSH: TSH: 3.92 u[IU]/mL (ref 0.450–4.500)

## 2021-09-14 LAB — PROTEIN / CREATININE RATIO, URINE
Creatinine, Urine: 106.6 mg/dL
Protein, Ur: 28.8 mg/dL
Protein/Creat Ratio: 270 mg/g creat — ABNORMAL HIGH (ref 0–200)

## 2021-09-14 MED ORDER — FERROUS SULFATE 325 (65 FE) MG PO TABS: 325.0000 mg | ORAL_TABLET | ORAL | 2 refills | Status: DC

## 2021-09-14 NOTE — Addendum Note (Signed)
Addended by: Cheral Marker on: 09/14/2021 01:07 PM   Modules accepted: Orders

## 2021-09-15 ENCOUNTER — Other Ambulatory Visit: Payer: Self-pay | Admitting: Nurse Practitioner

## 2021-09-15 ENCOUNTER — Telehealth: Payer: Self-pay

## 2021-09-15 LAB — INTEGRATED 1
Crown Rump Length: 76.9 mm
Gest. Age on Collection Date: 13.4 weeks
Maternal Age at EDD: 30.6 yr
Nuchal Translucency (NT): 2.1 mm
Number of Fetuses: 1
PAPP-A Value: 789 ng/mL
Weight: 276 [lb_av]

## 2021-09-15 LAB — GC/CHLAMYDIA PROBE AMP
Chlamydia trachomatis, NAA: NEGATIVE
Neisseria Gonorrhoeae by PCR: NEGATIVE

## 2021-09-15 NOTE — Telephone Encounter (Signed)
Called pt to relay [employer] regarding test results, prescription, and advice. Two identifiers used. Pt confirmed understanding.

## 2021-09-15 NOTE — Telephone Encounter (Signed)
-----   Message from Cheral Marker, [place] sent at 09/14/2021  9:05 PM EST ----- Hasn't read [employer]. Please call and read it to her/have her read it. Thanks

## 2021-09-17 LAB — URINE CULTURE

## 2021-09-17 MED ORDER — GLIPIZIDE ER 10 MG PO TB24: 10.0000 mg | ORAL_TABLET | Freq: Every day | ORAL | 0 refills | Status: DC

## 2021-09-17 MED ORDER — LEVOTHYROXINE SODIUM 25 MCG PO TABS: ORAL_TABLET | ORAL | 0 refills | Status: DC

## 2021-09-20 DIAGNOSIS — R8271 Bacteriuria: Secondary | ICD-10-CM | POA: Insufficient documentation

## 2021-09-20 MED ORDER — SULFAMETHOXAZOLE-TRIMETHOPRIM 800-160 MG PO TABS: 1.0000 | ORAL_TABLET | Freq: Two times a day (BID) | ORAL | 0 refills | Status: DC

## 2021-09-20 NOTE — Addendum Note (Signed)
Addended by: Cheral Marker on: 09/20/2021 01:29 PM   Modules accepted: Orders

## 2021-09-21 ENCOUNTER — Other Ambulatory Visit: Payer: Self-pay

## 2021-09-21 ENCOUNTER — Ambulatory Visit (INDEPENDENT_AMBULATORY_CARE_PROVIDER_SITE_OTHER): Payer: BC Managed Care – PPO | Admitting: *Deleted

## 2021-09-21 ENCOUNTER — Encounter: Payer: Self-pay | Admitting: Women's Health

## 2021-09-21 VITALS — BP 151/92 | HR 89 | Ht 65.0 in | Wt 278.0 lb

## 2021-09-21 DIAGNOSIS — O0992 Supervision of high risk pregnancy, unspecified, second trimester: Secondary | ICD-10-CM

## 2021-09-21 DIAGNOSIS — Z0130 Encounter for examination of blood pressure without abnormal findings: Secondary | ICD-10-CM

## 2021-09-21 DIAGNOSIS — Z3A14 14 weeks gestation of pregnancy: Secondary | ICD-10-CM

## 2021-09-21 LAB — POCT URINALYSIS DIPSTICK OB
Blood, UA: NEGATIVE
Glucose, UA: NEGATIVE
Ketones, UA: NEGATIVE
Nitrite, UA: NEGATIVE
POC,PROTEIN,UA: NEGATIVE

## 2021-09-21 NOTE — Progress Notes (Cosign Needed Addendum)
° °  NURSE VISIT- BLOOD PRESSURE CHECK  SUBJECTIVE:  Brianna [name] is a 31 y.o. G18P0000 female here for BP check. She is 109w5d pregnant    HYPERTENSION ROS:  Pregnant/postpartum:  Severe headaches that don't go away with tylenol/other medicines: No  Visual changes (seeing spots/double/blurred vision) Yes  Severe pain under right breast breast or in center of upper chest No  Severe nausea/vomiting No  Taking medicines as instructed yes    OBJECTIVE:  BP (!) 151/92 (BP Location: Right Arm, Patient Position: Sitting, Cuff Size: Normal)    Pulse 89    Ht 5\' 5"  (1.651 m)    Wt 278 lb (126.1 kg)    LMP 06/10/2021    BMI 46.26 kg/m   Appearance alert, well appearing, and in no distress.  ASSESSMENT: Pregnancy [redacted]w[redacted]d  blood pressure check  PLAN: Discussed with Dr. [redacted]w[redacted]d   Recommendations:  increase labetalol to 400 mg tid    Follow-up: as scheduled   Despina Hidden  09/21/2021 2:56 PM  Attestation of Attending Supervision of Nursing Visit Encounter: Evaluation and management procedures were performed by the nursing staff under my supervision and collaboration.  I have reviewed the nurse's note and chart, and I agree with the management and plan.  09/23/2021 MD Attending Physician for the Center for [facility] 09/23/2021 12:36 PM

## 2021-09-22 ENCOUNTER — Encounter: Payer: Self-pay | Admitting: Women's Health

## 2021-09-27 ENCOUNTER — Other Ambulatory Visit: Payer: Self-pay

## 2021-09-27 ENCOUNTER — Encounter: Payer: BC Managed Care – PPO | Attending: Women's Health | Admitting: Nutrition

## 2021-09-27 VITALS — Ht 65.0 in | Wt 272.0 lb

## 2021-09-27 DIAGNOSIS — I10 Essential (primary) hypertension: Secondary | ICD-10-CM | POA: Diagnosis not present

## 2021-09-27 DIAGNOSIS — O24119 Pre-existing diabetes mellitus, type 2, in pregnancy, unspecified trimester: Secondary | ICD-10-CM | POA: Diagnosis not present

## 2021-09-27 NOTE — Progress Notes (Signed)
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   NUTRITION ASSESSMENT  Works at [employer]  [redacted] weeks gestation. Had DM before pregnancy. She has been trying to eat better to improve blood sugars. She checks her BS are they have been running  FBS:89-91 mg/dl  2 hrs after breakfast  70-120's. Currently taking Metformin 1000 mg BID and this has helped her BS improve.  Excessive calorie intake prior to pregnancy as evidenced by obesity and uncontrolled Dm.  In 2022, her A1C was 8.6%. .  Patient was seen on 09-27-21 for Gestational Diabetes self[facility]management [facility]. The following learning objectives were met by the patient during this course:  States the definition of Gestational Diabetes States why dietary management is important in controlling blood glucose Describes the effects each nutrient has on blood glucose levels Demonstrates ability to create a balanced meal plan Demonstrates carbohydrate counting  States when to check blood glucose levels Demonstrates proper blood glucose monitoring techniques States the effect of stress and exercise on blood glucose levels States the importance of limiting caffeine and abstaining from alcohol and smoking   Patient instructed to monitor glucose levels: FBS: 60 - <90 1 hour: <140 2 hour: <120  *Patient received handouts: Nutrition Diabetes and Pregnancy Carbohydrate Counting List  Patient will be seen for follow-up as needed.

## 2021-10-04 ENCOUNTER — Other Ambulatory Visit: Payer: Self-pay

## 2021-10-04 ENCOUNTER — Encounter: Payer: Self-pay | Admitting: Obstetrics & Gynecology

## 2021-10-04 ENCOUNTER — Ambulatory Visit (INDEPENDENT_AMBULATORY_CARE_PROVIDER_SITE_OTHER): Payer: BC Managed Care – PPO | Admitting: Obstetrics & Gynecology

## 2021-10-04 VITALS — BP 138/83 | HR 100 | Wt 277.0 lb

## 2021-10-04 DIAGNOSIS — O0992 Supervision of high risk pregnancy, unspecified, second trimester: Secondary | ICD-10-CM

## 2021-10-04 DIAGNOSIS — Z1379 Encounter for other screening for genetic and chromosomal anomalies: Secondary | ICD-10-CM

## 2021-10-04 DIAGNOSIS — Z3A16 16 weeks gestation of pregnancy: Secondary | ICD-10-CM | POA: Diagnosis not present

## 2021-10-04 MED ORDER — LABETALOL HCL 200 MG PO TABS: 400.0000 mg | ORAL_TABLET | Freq: Three times a day (TID) | ORAL | 3 refills | Status: DC

## 2021-10-04 MED ORDER — METFORMIN HCL 1000 MG PO TABS: 1000.0000 mg | ORAL_TABLET | Freq: Two times a day (BID) | ORAL | 2 refills | Status: DC

## 2021-10-04 NOTE — Progress Notes (Unsigned)
HIGH-RISK PREGNANCY VISIT Patient name: Channin Merkl MRN SN:[id number]  Date of birth: 07/09/1991 Chief Complaint:   Routine Prenatal Visit  History of Present Illness:   Brianna [name] is a 31 y.o. G50P0000 female at [redacted]w[redacted]d with an Estimated Date of Delivery: 03/17/22 being seen today for ongoing management of a high-risk pregnancy complicated by {High Risk OB:23190}.    Today she reports {pregnancy symptoms:25616::"no complaints"}. Contractions: Not present. Vag. Bleeding: None.   . {Actions; denies-reports:120008} leaking of fluid.   Depression screen [facility] 2/9 09/27/2021 09/13/2021 05/05/2021 04/30/2021 12/25/2020  Decreased Interest 0 0 0 0 0  Down, Depressed, Hopeless 0 0 0 0 0  PHQ - 2 Score 0 0 0 0 0  Altered sleeping - 1 - - -  Tired, decreased energy - 1 - - -  Change in appetite - 0 - - -  Feeling bad or failure about yourself  - 0 - - -  Trouble concentrating - 0 - - -  Moving slowly or fidgety/restless - 0 - - -  Suicidal thoughts - 0 - - -  PHQ-9 Score - 2 - - -  Difficult doing work/chores - - - - -     GAD 7 : Generalized Anxiety Score 09/13/2021 11/19/2020  Nervous, Anxious, on Edge 0 0  Control/stop worrying 0 0  Worry too much - different things 0 0  Trouble relaxing 0 0  Restless 0 0  Easily annoyed or irritable 1 0  Afraid - awful might happen 0 0  Total GAD 7 Score 1 0     Review of Systems:   Pertinent items are noted in HPI Denies abnormal vaginal discharge w/ itching/odor/irritation, headaches, visual changes, shortness of breath, chest pain, abdominal pain, severe nausea/vomiting, or problems with urination or bowel movements unless otherwise stated above. Pertinent History Reviewed:  Reviewed past medical,surgical, social, obstetrical and family history.  Reviewed problem list, medications and allergies. Physical Assessment:   Vitals:   10/04/21 1213  BP: 138/83  Pulse: 100  Weight: 277 lb (125.6 kg)  Body mass index is 46.1 kg/m.            Physical Examination:   General appearance: {:315021}  Mental status: {:313008}  Skin: warm & dry   Extremities: Edema: None    Cardiovascular: normal heart rate noted  Respiratory: normal respiratory effort, no distress  Abdomen: gravid, soft, non-tender  Pelvic: {Blank single:19197::"Cervical exam performed","Cervical exam deferred"}         Fetal Status:          Fetal Surveillance Testing today: ***   Chaperone: {Chaperone:19197::"N/A","pt declined","Latisha Cresenzo","Janet Young","Amanda Andrews","Peggy Dones","Angel Neas"}    No results found for this or any previous visit (from the past 24 hour(s)).  Assessment & Plan:  High-risk pregnancy: G1P0000 at [redacted]w[redacted]d with an Estimated Date of Delivery: 03/17/22   1) ***, {stable/unstable:60080}  2) ***, {stable/unstable:60080}  Meds: No orders of the defined types were placed in this encounter.   Labs/procedures today: {ob lab/procedures:25214}  Treatment Plan:  ***  Reviewed: {Blank single:19197::"Term","Preterm"} labor symptoms and general obstetric precautions including but not limited to vaginal bleeding, contractions, leaking of fluid and fetal movement were reviewed in detail with the patient.  All questions were answered. {does does not:25387::"Does"} have home bp cuff. Office bp cuff given: {yes/no/default [id number] applicable"}. Check bp {weekly daily:25388::"weekly"}, let us know if consistently {pregnant bp:25389::">140 and/or >90"}.  Follow-up: No follow-ups on file.   Future Appointments  Date Time Provider [place]  10/05/2021 11:30 AM Crumpton, Wynona Neat, RD [facility] None    Orders Placed This Encounter  Procedures   Urine Culture   INTEGRATED 2   Florian Buff  10/04/2021 12:40 PM

## 2021-10-05 ENCOUNTER — Encounter: Payer: BC Managed Care – PPO | Attending: Family Medicine | Admitting: Nutrition

## 2021-10-05 ENCOUNTER — Encounter: Payer: Self-pay | Admitting: Nurse Practitioner

## 2021-10-05 ENCOUNTER — Ambulatory Visit (INDEPENDENT_AMBULATORY_CARE_PROVIDER_SITE_OTHER): Payer: BC Managed Care – PPO | Admitting: Nurse Practitioner

## 2021-10-05 VITALS — Ht 65.0 in | Wt 278.0 lb

## 2021-10-05 VITALS — BP 140/91 | HR 97 | Temp 97.3°F | Ht 65.0 in | Wt 276.6 lb

## 2021-10-05 DIAGNOSIS — E785 Hyperlipidemia, unspecified: Secondary | ICD-10-CM | POA: Insufficient documentation

## 2021-10-05 DIAGNOSIS — E118 Type 2 diabetes mellitus with unspecified complications: Secondary | ICD-10-CM | POA: Insufficient documentation

## 2021-10-05 DIAGNOSIS — E1169 Type 2 diabetes mellitus with other specified complication: Secondary | ICD-10-CM | POA: Insufficient documentation

## 2021-10-05 DIAGNOSIS — L309 Dermatitis, unspecified: Secondary | ICD-10-CM | POA: Diagnosis not present

## 2021-10-05 DIAGNOSIS — I10 Essential (primary) hypertension: Secondary | ICD-10-CM | POA: Insufficient documentation

## 2021-10-05 MED ORDER — TRIAMCINOLONE ACETONIDE 0.1 % EX CREA: 1.0000 "application " | TOPICAL_CREAM | Freq: Two times a day (BID) | CUTANEOUS | 0 refills | Status: DC

## 2021-10-05 MED ORDER — TRIAMCINOLONE ACETONIDE 0.1 % EX CREA: 1.0000 "application " | TOPICAL_CREAM | Freq: Two times a day (BID) | CUTANEOUS | 0 refills | Status: AC

## 2021-10-05 NOTE — Progress Notes (Signed)
Medical Nutrition Therapy  ?Appointment Start time:  1300  Appointment End time:  1400 ? ?Primary concerns today: Type 2 DM [redacted] weeks gestation EDU Aug 24th   ?Referral diagnosis: E11.8, 024.4 ?Preferred learning style: no preference ?Learning readiness: CHange in progress. ? ? ?NUTRITION ASSESSMENT Diabetic Type during 3rd trimester ?Works at [employer]. Work hours change. ?She has been trying to eat better, eat meals on time and take/meals or  have  better choices for work ?Feels better.  ?OBN [facility] back April 6th. ?[facility] Aug 24th . Boy. ? ?Making progress. ? ?Changes made: Eatng meals on time now. Eating 30 g carbs per meal ?Anthropometrics  ?Wt Readings from Last 3 Encounters:  ?10/05/21 276 lb 9.6 oz (125.5 kg)  ?10/04/21 277 lb (125.6 kg)  ?09/27/21 272 lb (123.4 kg)  ? ?Ht Readings from Last 3 Encounters:  ?10/05/21 5\' 5"  (1.651 m)  ?09/27/21 5\' 5"  (1.651 m)  ?09/21/21 5\' 5"  (1.651 m)  ? ?There is no height or weight on file to calculate BMI. ?@BMIFA @ ?Facility age limit for growth percentiles is 20 years. ?Facility age limit for growth percentiles is 20 years. ?This SmartLink has not been configured with any valid records.  ? ?Lab Results  ?Component Value Date  ? HGBA1C 5.7 (H) 07/20/2021  ? ?  ? ?Clinical ?Medical Hx: DM Type 2, Obesity ?Medications:  Metformin 09/23/21 mg BID,  ?Labs: Last A1c 5.7%, had been 8.6% 04/2021 ?FBS:89-91 mg/dl  2 hrs after breakfast    2 hrs 70's. ?Notable Signs/Symptoms: none ? ?Lifestyle & Dietary Hx ?Single 1-10 or 4-10 pm. FT at . ?First baby. Lives with her parents.  ?Eats 3 meals per day ? ?Estimated daily fluid intake: 80- oz ?Supplements: Prenatal vitamins. ?Sleep: varies due to work schedule ?Stress / self-care: her job, being pregnant ?Current average weekly physical activity: walks a lot at work, 6,000 steps ? ?24-Hr Dietary Recall ?First Meal: Oatmeal and egg, water ?Snack: aplle or yogurt ?Second Meal: Tuna sandwich, water ?Snack:  ?Third  Meal: Beef stew, green beans, water ?Snack:  ?Beverages: water ? ?Estimated Energy Needs ?Calories: 1800 ?Carbohydrate: 200 g ?Protein: 135g ?Fat: 50g ? ? ?NUTRITION DIAGNOSIS  ?NB-1.1 Food and nutrition-related knowledge deficit As related to Gestational DM.  As evidenced by  . ? ? ?NUTRITION INTERVENTION  ?Nutrition education (E-1) on the following topics:  ?Nutrition and Diabetes education provided on My Plate, CHO counting, meal planning, portion sizes, timing of meals, avoiding snacks between meals unless having a low blood sugar, target ranges for A1C and blood sugars, signs/symptoms and treatment of hyper/hypoglycemia, monitoring blood sugars, taking medications as prescribed, benefits of exercising 30 minutes per day and prevention of complications of DM. ?Lifestyle Medicine ?- Whole Food, Plant Predominant Nutrition is highly recommended: Eat Plenty of vegetables, Mushrooms, fruits, Legumes, Whole Grains, Nuts, seeds in lieu of processed meats, processed snacks/pastries red meat, poultry, eggs.  ?  ?-It is better to avoid simple carbohydrates including: Cakes, Sweet Desserts, Ice Cream, Soda (diet and regular), Sweet Tea, Candies, Chips, Cookies, Store Bought Juices, Alcohol in Excess of  1-2 drinks a day, Lemonade,  Artificial Sweeteners, Doughnuts, Coffee Creamers, "Sugar-free" Products, etc, etc.  This is not a complete list..... ? ?Exercise: If you are able: 30 -60 minutes a day ,4 days a week, or 150 minutes a week.  The longer the better.  Combine stretch, strength, and aerobic activities.  If you were told in the past that you have high  risk for cardiovascular diseases, you may seek evaluation by your heart doctor prior to initiating moderate to intense exercise programs ? ?Handouts Provided Include  ?My Plant based meal plan ?Meal Plan Card ?Diabetes during pregnancy ? ?Learning Style & Readiness for Change ?Teaching method utilized: Visual & Auditory  ?Demonstrated degree of understanding via:  Teach Back  ?Barriers to learning/adherence to lifestyle change: none ? ?Goals Established by Pt ?Goals ? ?Walk 30 minutes three times per week. ?Keep FBS less than 95 and Less than 120  mg/dl 2 hours after a meal. ?Focus on more plant based high fiber foods. ?Take medication as prescribed. ? ? ?MONITORING & EVALUATION ?Dietary intake, weekly physical activity, and blood sugars in PRN. ? ?Next Steps  ?Patient is to work on meal prepping and planning meals to help control blood sugars.. ? ?

## 2021-10-05 NOTE — Patient Instructions (Addendum)
Goals ? ?Walk 30 minutes three times per week. ?Keep FBS less than 95 and Less than 120  mg/dl 2 hours after a meal. ?Focus on more plant based high fiber foods. ?Take medication as prescribed.  ? ?Diabetes Mellitus and Nutrition, Adult ?When you have diabetes, or diabetes mellitus, it is very important to have healthy eating habits because your blood sugar (glucose) levels are greatly affected by what you eat and drink. Eating healthy foods in the right amounts, at about the same times every day, can help you: ?Manage your blood glucose. ?Lower your risk of heart disease. ?Improve your blood pressure. ?Reach or maintain a healthy weight. ?What can affect my meal plan? ?Every person with diabetes is different, and each person has different needs for a meal plan. Your health care provider may recommend that you work with a dietitian to make a meal plan that is best for you. Your meal plan may vary depending on factors such as: ?The calories you need. ?The medicines you take. ?Your weight. ?Your blood glucose, blood pressure, and cholesterol levels. ?Your activity level. ?Other health conditions you have, such as heart or kidney disease. ?How do carbohydrates affect me? ?Carbohydrates, also called carbs, affect your blood glucose level more than any other type of food. Eating carbs raises the amount of glucose in your blood. ?It is important to know how many carbs you can safely have in each meal. This is different for every person. Your dietitian can help you calculate how many carbs you should have at each meal and for each snack. ?How does alcohol affect me? ?Alcohol can cause a decrease in blood glucose (hypoglycemia), especially if you use insulin or take certain diabetes medicines by mouth. Hypoglycemia can be a life-threatening condition. Symptoms of hypoglycemia, such as sleepiness, dizziness, and confusion, are similar to symptoms of having too much alcohol. ?Do not drink alcohol if: ?Your health care  provider tells you not to drink. ?You are pregnant, may be pregnant, or are planning to become pregnant. ?If you drink alcohol: ?Limit how much you have to: ?0-1 drink a day for women. ?0-2 drinks a day for men. ?Know how much alcohol is in your drink. In the [place] one drink equals one 12 oz bottle of beer (355 mL), one 5 oz glass of wine (148 mL), or one 1? oz glass of hard liquor (44 mL). ?Keep yourself hydrated with water, diet soda, or unsweetened iced tea. Keep in mind that regular soda, juice, and other mixers may contain a lot of sugar and must be counted as carbs. ?What are tips for following this plan? ?Reading food labels ?Start by checking the serving size on the Nutrition Facts label of packaged foods and drinks. The number of calories and the amount of carbs, fats, and other nutrients listed on the label are based on one serving of the item. Many items contain more than one serving per package. ?Check the total grams (g) of carbs in one serving. ?Check the number of grams of saturated fats and trans fats in one serving. Choose foods that have a low amount or none of these fats. ?Check the number of milligrams (mg) of salt (sodium) in one serving. Most people should limit total sodium intake to less than 2,300 mg per day. ?Always check the nutrition information of foods labeled as "low-fat" or "nonfat." These foods may be higher in added sugar or refined carbs and should be avoided. ?Talk to your dietitian to identify your daily goals for  nutrients listed on the label. ?Shopping ?Avoid buying canned, pre-made, or processed foods. These foods tend to be high in fat, sodium, and added sugar. ?Shop around the outside edge of the grocery store. This is where you will most often find fresh fruits and vegetables, bulk grains, fresh meats, and fresh dairy products. ?Cooking ?Use low-heat cooking methods, such as baking, instead of high-heat cooking methods, such as deep frying. ?Cook using healthy oils, such as  olive, canola, or sunflower oil. ?Avoid cooking with butter, cream, or high-fat meats. ?Meal planning ?Eat meals and snacks regularly, preferably at the same times every day. Avoid going long periods of time without eating. ?Eat foods that are high in fiber, such as fresh fruits, vegetables, beans, and whole grains. ?Eat 4-6 oz (112-168 g) of lean protein each day, such as lean meat, chicken, fish, eggs, or tofu. One ounce (oz) (28 g) of lean protein is equal to: ?1 oz (28 g) of meat, chicken, or fish. ?1 egg. ?? cup (62 g) of tofu. ?Eat some foods each day that contain healthy fats, such as avocado, nuts, seeds, and fish. ?What foods should I eat? ?Fruits ?Berries. Apples. Oranges. Peaches. Apricots. Plums. Grapes. Mangoes. Papayas. Pomegranates. Kiwi. Cherries. ?Vegetables ?Leafy greens, including lettuce, spinach, kale, chard, collard greens, mustard greens, and cabbage. Beets. Cauliflower. Broccoli. Carrots. Green beans. Tomatoes. Peppers. Onions. Cucumbers. Brussels sprouts. ?Grains ?Whole grains, such as whole-wheat or whole-grain bread, crackers, tortillas, cereal, and pasta. Unsweetened oatmeal. Quinoa. Brown or wild rice. ?Meats and other proteins ?Seafood. Poultry without skin. Lean cuts of poultry and beef. Tofu. Nuts. Seeds. ?Dairy ?Low-fat or fat-free dairy products such as milk, yogurt, and cheese. ?The items listed above may not be a complete list of foods and beverages you can eat and drink. Contact a dietitian for more information. ?What foods should I avoid? ?Fruits ?Fruits canned with syrup. ?Vegetables ?Canned vegetables. Frozen vegetables with butter or cream sauce. ?Grains ?Refined white flour and flour products such as bread, pasta, snack foods, and cereals. Avoid all processed foods. ?Meats and other proteins ?Fatty cuts of meat. Poultry with skin. Breaded or fried meats. Processed meat. Avoid saturated fats. ?Dairy ?Full-fat yogurt, cheese, or milk. ?Beverages ?Sweetened drinks, such as soda  or iced tea. ?The items listed above may not be a complete list of foods and beverages you should avoid. Contact a dietitian for more information. ?Questions to ask a health care provider ?Do I need to meet with a certified diabetes care and education specialist? ?Do I need to meet with a dietitian? ?What number can I call if I have questions? ?When are the best times to check my blood glucose? ?Where to find more information: ?American Diabetes Association: diabetes.org ?Academy of Nutrition and Dietetics: eatright.org ?[employer] of Diabetes and Digestive and Kidney Diseases: AmenCredit.is ?Association of Diabetes Care & Education Specialists: diabeteseducator.org ?Summary ?It is important to have healthy eating habits because your blood sugar (glucose) levels are greatly affected by what you eat and drink. It is important to use alcohol carefully. ?A healthy meal plan will help you manage your blood glucose and lower your risk of heart disease. ?Your health care provider may recommend that you work with a dietitian to make a meal plan that is best for you. ?This information is not intended to replace advice given to you by your health care provider. Make sure you discuss any questions you have with your health care provider. ?Document Revised: 02/12/2020 Document Reviewed: 02/12/2020 ?Elsevier Patient Education ? 2022  [employer]. ? ? ?

## 2021-10-05 NOTE — Progress Notes (Signed)
? ?  Subjective:  ? ? Patient ID: Brianna [name], female    DOB: 1990-08-20, 31 y.o.   MRN: [id number] ? ?HPI ? ?31 year old patient who is [redacted] weeks gestation presents to the clinic for rash on her buttocks that she noticed a couple of months ago.  Patient states that the past couple days the rash has now become itchy.  Patient denies any discharge coming from the rash, burning, swelling, pain.  Patient states that she has been using [employer] with minimal relief. ? ?Review of Systems  ?Skin:  Positive for rash.  ?All other systems reviewed and are negative. ? ?   ?Objective:  ? Physical Exam ?Vitals reviewed.  ?Constitutional:   ?   General: She is not in acute distress. ?   Appearance: Normal appearance. She is not ill-appearing, toxic-appearing or diaphoretic.  ?   Comments: [redacted] weeks gestation  ?Skin: ?   General: Skin is warm.  ?   Capillary Refill: Capillary refill takes less than 2 seconds.  ?   Findings: Rash present.  ?   Comments: Multiple hyperpigmented lesions noted to left buttock and one noted on the right buttock.  Lesions are scaly but intact.  No redness, no purulent drainage, no swelling, no pain to palpation.  ?Neurological:  ?   General: No focal deficit present.  ?   Mental Status: She is alert and oriented to person, place, and time.  ?Psychiatric:     ?   Mood and Affect: Mood normal.     ?   Behavior: Behavior normal.  ? ? ? ? ? ?   ?Assessment & Plan:  ? ?1. Dermatitis ?-Likely dermatitis/eczema possibly secondary to pregnancy. ?- triamcinolone cream (KENALOG) 0.1 %; Apply 1 application. topically 2 (two) times daily for 7 days.  Dispense: 14 g; Refill: 0 ?-Discussed potential side effects of utilizing steroid cream such as increased striated to the area of application.  Patient stated understanding. ?-We will use triamcinolone cream for 7 days instead of 14 days given patient's history of diabetes and to hopefully prevent any increasing striate.  Patient stated understanding. ?-Return to  clinic if rash does not improve or is worse. ? ?  ?Note:  This document was prepared using Dragon voice recognition software and may include unintentional dictation errors. ? ? ? ?

## 2021-10-06 ENCOUNTER — Encounter: Payer: Self-pay | Admitting: Women's Health

## 2021-10-06 ENCOUNTER — Encounter: Payer: Self-pay | Admitting: Nutrition

## 2021-10-06 LAB — INTEGRATED 2
AFP MoM: 1.13
Alpha-Fetoprotein: 24.9 ng/mL
Crown Rump Length: 76.9 mm
DIA MoM: 0.95
DIA Value: 103.6 pg/mL
Estriol, Unconjugated: 0.57 ng/mL
Gest. Age on Collection Date: 13.4 weeks
Gestational Age: 16.4 weeks
Maternal Age at EDD: 30.6 yr
Nuchal Translucency (NT): 2.1 mm
Nuchal Translucency MoM: 1.17
Number of Fetuses: 1
PAPP-A MoM: 1.29
PAPP-A Value: 789 ng/mL
Test Results:: NEGATIVE
Weight: 276 [lb_av]
Weight: 276 [lb_av]
hCG MoM: 1.56
hCG Value: 32.4 IU/mL
uE3 MoM: 0.69

## 2021-10-07 ENCOUNTER — Other Ambulatory Visit: Payer: Self-pay | Admitting: Obstetrics & Gynecology

## 2021-10-07 LAB — URINE CULTURE

## 2021-10-07 MED ORDER — NITROFURANTOIN MONOHYD MACRO 100 MG PO CAPS: 100.0000 mg | ORAL_CAPSULE | Freq: Two times a day (BID) | ORAL | 0 refills | Status: DC

## 2021-10-11 ENCOUNTER — Other Ambulatory Visit: Payer: Self-pay | Admitting: Women's Health

## 2021-10-12 ENCOUNTER — Encounter: Payer: Self-pay | Admitting: Nutrition

## 2021-10-12 NOTE — Patient Instructions (Signed)
Goals ? ?  ?Patient instructed to monitor glucose levels: ?FBS: 60 - <90 ?1 hour: <140 ?2 hour: <120 ? ? ?Diabetes Mellitus and Nutrition, Adult ?When you have diabetes, or diabetes mellitus, it is very important to have healthy eating habits because your blood sugar (glucose) levels are greatly affected by what you eat and drink. Eating healthy foods in the right amounts, at about the same times every day, can help you: ?Manage your blood glucose. ?Lower your risk of heart disease. ?Improve your blood pressure. ?Reach or maintain a healthy weight. ?What can affect my meal plan? ?Every person with diabetes is different, and each person has different needs for a meal plan. Your health care provider may recommend that you work with a dietitian to make a meal plan that is best for you. Your meal plan may vary depending on factors such as: ?The calories you need. ?The medicines you take. ?Your weight. ?Your blood glucose, blood pressure, and cholesterol levels. ?Your activity level. ?Other health conditions you have, such as heart or kidney disease. ?How do carbohydrates affect me? ?Carbohydrates, also called carbs, affect your blood glucose level more than any other type of food. Eating carbs raises the amount of glucose in your blood. ?It is important to know how many carbs you can safely have in each meal. This is different for every person. Your dietitian can help you calculate how many carbs you should have at each meal and for each snack. ?How does alcohol affect me? ?Alcohol can cause a decrease in blood glucose (hypoglycemia), especially if you use insulin or take certain diabetes medicines by mouth. Hypoglycemia can be a life-threatening condition. Symptoms of hypoglycemia, such as sleepiness, dizziness, and confusion, are similar to symptoms of having too much alcohol. ?Do not drink alcohol if: ?Your health care provider tells you not to drink. ?You are pregnant, may be pregnant, or are planning to become  pregnant. ?If you drink alcohol: ?Limit how much you have to: ?0-1 drink a day for women. ?0-2 drinks a day for men. ?Know how much alcohol is in your drink. In the [place] one drink equals one 12 oz bottle of beer (355 mL), one 5 oz glass of wine (148 mL), or one 1? oz glass of hard liquor (44 mL). ?Keep yourself hydrated with water, diet soda, or unsweetened iced tea. Keep in mind that regular soda, juice, and other mixers may contain a lot of sugar and must be counted as carbs. ?What are tips for following this plan? ?Reading food labels ?Start by checking the serving size on the Nutrition Facts label of packaged foods and drinks. The number of calories and the amount of carbs, fats, and other nutrients listed on the label are based on one serving of the item. Many items contain more than one serving per package. ?Check the total grams (g) of carbs in one serving. ?Check the number of grams of saturated fats and trans fats in one serving. Choose foods that have a low amount or none of these fats. ?Check the number of milligrams (mg) of salt (sodium) in one serving. Most people should limit total sodium intake to less than 2,300 mg per day. ?Always check the nutrition information of foods labeled as "low-fat" or "nonfat." These foods may be higher in added sugar or refined carbs and should be avoided. ?Talk to your dietitian to identify your daily goals for nutrients listed on the label. ?Shopping ?Avoid buying canned, pre-made, or processed foods. These foods tend to  be high in fat, sodium, and added sugar. ?Shop around the outside edge of the grocery store. This is where you will most often find fresh fruits and vegetables, bulk grains, fresh meats, and fresh dairy products. ?Cooking ?Use low-heat cooking methods, such as baking, instead of high-heat cooking methods, such as deep frying. ?Cook using healthy oils, such as olive, canola, or sunflower oil. ?Avoid cooking with butter, cream, or high-fat meats. ?Meal  planning ?Eat meals and snacks regularly, preferably at the same times every day. Avoid going long periods of time without eating. ?Eat foods that are high in fiber, such as fresh fruits, vegetables, beans, and whole grains. ?Eat 4-6 oz (112-168 g) of lean protein each day, such as lean meat, chicken, fish, eggs, or tofu. One ounce (oz) (28 g) of lean protein is equal to: ?1 oz (28 g) of meat, chicken, or fish. ?1 egg. ?? cup (62 g) of tofu. ?Eat some foods each day that contain healthy fats, such as avocado, nuts, seeds, and fish. ?What foods should I eat? ?Fruits ?Berries. Apples. Oranges. Peaches. Apricots. Plums. Grapes. Mangoes. Papayas. Pomegranates. Kiwi. Cherries. ?Vegetables ?Leafy greens, including lettuce, spinach, kale, chard, collard greens, mustard greens, and cabbage. Beets. Cauliflower. Broccoli. Carrots. Green beans. Tomatoes. Peppers. Onions. Cucumbers. Brussels sprouts. ?Grains ?Whole grains, such as whole-wheat or whole-grain bread, crackers, tortillas, cereal, and pasta. Unsweetened oatmeal. Quinoa. Brown or wild rice. ?Meats and other proteins ?Seafood. Poultry without skin. Lean cuts of poultry and beef. Tofu. Nuts. Seeds. ?Dairy ?Low-fat or fat-free dairy products such as milk, yogurt, and cheese. ?The items listed above may not be a complete list of foods and beverages you can eat and drink. Contact a dietitian for more information. ?What foods should I avoid? ?Fruits ?Fruits canned with syrup. ?Vegetables ?Canned vegetables. Frozen vegetables with butter or cream sauce. ?Grains ?Refined white flour and flour products such as bread, pasta, snack foods, and cereals. Avoid all processed foods. ?Meats and other proteins ?Fatty cuts of meat. Poultry with skin. Breaded or fried meats. Processed meat. Avoid saturated fats. ?Dairy ?Full-fat yogurt, cheese, or milk. ?Beverages ?Sweetened drinks, such as soda or iced tea. ?The items listed above may not be a complete list of foods and beverages you  should avoid. Contact a dietitian for more information. ?Questions to ask a health care provider ?Do I need to meet with a certified diabetes care and education specialist? ?Do I need to meet with a dietitian? ?What number can I call if I have questions? ?When are the best times to check my blood glucose? ?Where to find more information: ?American Diabetes Association: diabetes.org ?Academy of Nutrition and Dietetics: eatright.org ?[employer] of Diabetes and Digestive and Kidney Diseases: AmenCredit.is ?Association of Diabetes Care & Education Specialists: diabeteseducator.org ?Summary ?It is important to have healthy eating habits because your blood sugar (glucose) levels are greatly affected by what you eat and drink. It is important to use alcohol carefully. ?A healthy meal plan will help you manage your blood glucose and lower your risk of heart disease. ?Your health care provider may recommend that you work with a dietitian to make a meal plan that is best for you. ?This information is not intended to replace advice given to you by your health care provider. Make sure you discuss any questions you have with your health care provider. ?Document Revised: 02/12/2020 Document Reviewed: 02/12/2020 ?Elsevier Patient Education ? [employer]. ? ?

## 2021-10-27 ENCOUNTER — Other Ambulatory Visit: Payer: Self-pay | Admitting: Obstetrics & Gynecology

## 2021-10-27 DIAGNOSIS — Z363 Encounter for antenatal screening for malformations: Secondary | ICD-10-CM

## 2021-10-28 ENCOUNTER — Ambulatory Visit (INDEPENDENT_AMBULATORY_CARE_PROVIDER_SITE_OTHER): Payer: BC Managed Care – PPO | Admitting: Obstetrics & Gynecology

## 2021-10-28 ENCOUNTER — Ambulatory Visit (INDEPENDENT_AMBULATORY_CARE_PROVIDER_SITE_OTHER): Payer: BC Managed Care – PPO

## 2021-10-28 VITALS — BP 149/89 | HR 90 | Wt 281.0 lb

## 2021-10-28 DIAGNOSIS — Z3A20 20 weeks gestation of pregnancy: Secondary | ICD-10-CM

## 2021-10-28 DIAGNOSIS — Z363 Encounter for antenatal screening for malformations: Secondary | ICD-10-CM | POA: Diagnosis not present

## 2021-10-28 DIAGNOSIS — O0992 Supervision of high risk pregnancy, unspecified, second trimester: Secondary | ICD-10-CM

## 2021-10-28 DIAGNOSIS — N1330 Unspecified hydronephrosis: Secondary | ICD-10-CM

## 2021-10-28 DIAGNOSIS — O24319 Unspecified pre-existing diabetes mellitus in pregnancy, unspecified trimester: Secondary | ICD-10-CM

## 2021-10-28 DIAGNOSIS — I10 Essential (primary) hypertension: Secondary | ICD-10-CM

## 2021-10-28 DIAGNOSIS — Q622 Congenital megaureter: Secondary | ICD-10-CM

## 2021-10-28 LAB — POCT URINALYSIS DIPSTICK OB
Blood, UA: NEGATIVE
Glucose, UA: NEGATIVE
Ketones, UA: NEGATIVE
Leukocytes, UA: NEGATIVE
Nitrite, UA: NEGATIVE
POC,PROTEIN,UA: NEGATIVE

## 2021-10-28 NOTE — Progress Notes (Signed)
? ? ?HIGH-RISK PREGNANCY VISIT ?Patient name: Brianna [name] MRN [id number]  Date of birth: September 02, 1990 ?Chief Complaint:   ?Routine Prenatal Visit ? ?History of Present Illness:   ?Brianna [name] is a 31 y.o. G65P0000 female at [redacted]w[redacted]d with an Estimated Date of Delivery: 03/17/22 being seen today for ongoing management of a high-risk pregnancy complicated by [facility] and Class B DM.   ? ?Today she reports no complaints. Contractions: Not present. Vag. Bleeding: None.   . denies leaking of fluid.  ? ? ?  09/27/2021  ?  1:11 PM 09/13/2021  ? 10:29 AM 05/05/2021  ?  8:28 AM 04/30/2021  ?  9:15 AM 12/25/2020  ? 10:59 AM  ?Depression screen PHQ 2/9  ?Decreased Interest 0 0 0 0 0  ?Down, Depressed, Hopeless 0 0 0 0 0  ?PHQ - 2 Score 0 0 0 0 0  ?Altered sleeping  1     ?Tired, decreased energy  1     ?Change in appetite  0     ?Feeling bad or failure about yourself   0     ?Trouble concentrating  0     ?Moving slowly or fidgety/restless  0     ?Suicidal thoughts  0     ?PHQ-9 Score  2     ? ?  ? ?  09/13/2021  ? 10:30 AM 11/19/2020  ?  9:12 AM  ?GAD 7 : Generalized Anxiety Score  ?Nervous, Anxious, on Edge 0 0  ?Control/stop worrying 0 0  ?Worry too much - different things 0 0  ?Trouble relaxing 0 0  ?Restless 0 0  ?Easily annoyed or irritable 1 0  ?Afraid - awful might happen 0 0  ?Total GAD 7 Score 1 0  ? ? ? ?Review of Systems:   ?Pertinent items are noted in HPI ?Denies abnormal vaginal discharge w/ itching/odor/irritation, headaches, visual changes, shortness of breath, chest pain, abdominal pain, severe nausea/vomiting, or problems with urination or bowel movements unless otherwise stated above. ?Pertinent History Reviewed:  ?Reviewed past medical,surgical, social, obstetrical and family history.  ?Reviewed problem list, medications and allergies. ?Physical Assessment:  ? ?Vitals:  ? 10/28/21 1006  ?BP: (!) 149/89  ?Pulse: 90  ?Weight: 281 lb (127.5 kg)  ?Body mass index is 46.76 kg/m?. ?     ?     Physical Examination:  ? General  appearance: alert, well appearing, and in no distress ? Mental status: alert, oriented to person, place, and time ? Skin: warm & dry  ? Extremities: Edema: None  ?  Cardiovascular: normal heart rate noted ? Respiratory: normal respiratory effort, no distress ? Abdomen: gravid, soft, non-tender ? Pelvic: Cervical exam deferred        ? ?Fetal Status:         ? ?Fetal Surveillance Testing today: sonogram  ? ?Chaperone: N/A   ? ?Results for orders placed or performed in visit on 10/28/21 (from the past 24 hour(s))  ?POC Urinalysis Dipstick OB  ? Collection Time: 10/28/21 10:14 AM  ?Result Value Ref Range  ? Color, UA    ? Clarity, UA    ? Glucose, UA Negative Negative  ? Bilirubin, UA    ? Ketones, UA neg   ? Spec Grav, UA    ? Blood, UA neg   ? pH, UA    ? POC,PROTEIN,UA Negative Negative, Trace, Small (1+), Moderate (2+), Large (3+), 4+  ? Urobilinogen, UA    ? Nitrite, UA neg   ?  Leukocytes, UA Negative Negative  ? Appearance    ? Odor    ?  ?Assessment & Plan:  ?High-risk pregnancy: G1P0000 at [redacted]w[redacted]d with an Estimated Date of Delivery: 03/17/22  ? ? ?  ICD-10-CM   ?1. Supervision of high risk pregnancy in second trimester  O09.92 POC Urinalysis Dipstick OB  ?  ?2. Pregestational diabetes mellitus, modified White class B  O24.319   ? Metformin 1000 mg BID, pt has not taken any CBG since her last visit.  I talked with her at length regarding checkg CBG.  see back in 1 week  ?  ?3. Chronic hypertension  I10   ? Labetalol 400 TID: stable  ?  ?4. 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 will track  ?  ?5. Renal pelvic dilation, left  N13.30   ? will track  ?  ?6. [redacted] weeks gestation of pregnancy  Z3A.20 POC Urinalysis Dipstick OB  ?  ?  ? ? ?Meds: No orders of the defined types were placed in this encounter. ? ? ?Orders:  ?Orders Placed This Encounter  ?Procedures  ? POC Urinalysis Dipstick OB  ?  ? ?Labs/procedures today: U/S ? ?Treatment Plan:  per protocol ? ?Reviewed: Preterm labor symptoms and general  obstetric precautions including but not limited to vaginal bleeding, contractions, leaking of fluid and fetal movement were reviewed in detail with the patient.  All questions were answered. Does have home bp cuff. Office bp cuff given: not applicable. Check bp daily, let us know if consistently >150 and/or >95. ? ?Follow-up: Return in about 1 week (around 11/04/2021) for see Dr Elonda Husky 1 week. ? ? ?Future Appointments  ?Date Time Provider [place]  ?11/05/2021 11:10 AM Antoinette Haskett, Mertie Clause, MD [facility]  ? ? ?Orders Placed This Encounter  ?Procedures  ? POC Urinalysis Dipstick OB  ? ?[place]  ?Attending Physician for the Center for [place] ?[facility] ?10/28/2021 ?4:53 PM ? ?

## 2021-10-28 NOTE — Progress Notes (Signed)
[place] 20 wks,breech/transverse head right,FHR 152 bpm,CX 4.3 cm,posterior placenta gr 0,normal right ovary,left ovary not visualized,SVP of fluid 6.4 cm,right hydronephrosis with dilated ureter,right renal pelvis 7.6 mm,left renal pelvic dilation 5.1 mm,EFW 362 g 76%,anatomy complete ?

## 2021-11-05 ENCOUNTER — Encounter: Payer: Self-pay | Admitting: Obstetrics & Gynecology

## 2021-11-05 ENCOUNTER — Ambulatory Visit (INDEPENDENT_AMBULATORY_CARE_PROVIDER_SITE_OTHER): Payer: BC Managed Care – PPO | Admitting: Obstetrics & Gynecology

## 2021-11-05 VITALS — BP 144/87 | HR 96 | Wt 282.0 lb

## 2021-11-05 DIAGNOSIS — I10 Essential (primary) hypertension: Secondary | ICD-10-CM

## 2021-11-05 DIAGNOSIS — O24319 Unspecified pre-existing diabetes mellitus in pregnancy, unspecified trimester: Secondary | ICD-10-CM

## 2021-11-05 DIAGNOSIS — O0992 Supervision of high risk pregnancy, unspecified, second trimester: Secondary | ICD-10-CM

## 2021-11-05 MED ORDER — GLYBURIDE 2.5 MG PO TABS: 2.5000 mg | ORAL_TABLET | Freq: Every day | ORAL | 3 refills | Status: DC

## 2021-11-05 NOTE — Progress Notes (Signed)
? ? ?HIGH-RISK PREGNANCY VISIT ?Patient name: Brianna [name] MRN [id number]  Date of birth: 1991/03/25 ?Chief Complaint:   ?Routine Prenatal Visit (Review blood sugars) ? ?History of Present Illness:   ?Brianna [name] is a 31 y.o. G34P0000 female at [redacted]w[redacted]d with an Estimated Date of Delivery: 03/17/22 being seen today for ongoing management of a high-risk pregnancy complicated by [facility] and Class B DM.   ? ?Today she reports no complaints. Contractions: Not present. Vag. Bleeding: None.  Movement: Present. denies leaking of fluid.  ? ? ?  09/27/2021  ?  1:11 PM 09/13/2021  ? 10:29 AM 05/05/2021  ?  8:28 AM 04/30/2021  ?  9:15 AM 12/25/2020  ? 10:59 AM  ?Depression screen PHQ 2/9  ?Decreased Interest 0 0 0 0 0  ?Down, Depressed, Hopeless 0 0 0 0 0  ?PHQ - 2 Score 0 0 0 0 0  ?Altered sleeping  1     ?Tired, decreased energy  1     ?Change in appetite  0     ?Feeling bad or failure about yourself   0     ?Trouble concentrating  0     ?Moving slowly or fidgety/restless  0     ?Suicidal thoughts  0     ?PHQ-9 Score  2     ? ?  ? ?  09/13/2021  ? 10:30 AM 11/19/2020  ?  9:12 AM  ?GAD 7 : Generalized Anxiety Score  ?Nervous, Anxious, on Edge 0 0  ?Control/stop worrying 0 0  ?Worry too much - different things 0 0  ?Trouble relaxing 0 0  ?Restless 0 0  ?Easily annoyed or irritable 1 0  ?Afraid - awful might happen 0 0  ?Total GAD 7 Score 1 0  ? ? ? ?Review of Systems:   ?Pertinent items are noted in HPI ?Denies abnormal vaginal discharge w/ itching/odor/irritation, headaches, visual changes, shortness of breath, chest pain, abdominal pain, severe nausea/vomiting, or problems with urination or bowel movements unless otherwise stated above. ?Pertinent History Reviewed:  ?Reviewed past medical,surgical, social, obstetrical and family history.  ?Reviewed problem list, medications and allergies. ?Physical Assessment:  ? ?Vitals:  ? 11/05/21 1110  ?BP: (!) 144/87  ?Pulse: 96  ?Weight: 282 lb (127.9 kg)  ?Body mass index is 46.93 kg/m?. ?     ?      Physical Examination:  ? General appearance: alert, well appearing, and in no distress ? Mental status: alert, oriented to person, place, and time ? Skin: warm & dry  ? Extremities: Edema: None  ?  Cardiovascular: normal heart rate noted ? Respiratory: normal respiratory effort, no distress ? Abdomen: gravid, soft, non-tender ? Pelvic: Cervical exam deferred        ? ?Fetal Status: Fetal Heart Rate (bpm): 160 Fundal Height: 14 cm Movement: Present   ? ?Fetal Surveillance Testing today: FHR 160  ? ?Chaperone: N/A   ? ?No results found for this or any previous visit (from the past 24 hour(s)).  ?Assessment & Plan:  ?High-risk pregnancy: G1P0000 at [redacted]w[redacted]d with an Estimated Date of Delivery: 03/17/22  ? ? ?  ICD-10-CM   ?1. Supervision of high risk pregnancy in second trimester  O09.92   ?  ?2. Pregestational diabetes mellitus, modified White class B  O24.319   ? Compliance has been an issue.  Did CBG this week decently on metformin 1000 mg BID, will add glyburide 2.5 mg at bedtime, add consistent bedtime snack  ?  ?3. Chronic  hypertension  I10   ? on labetalol 400 TID: continue  ?  ?  ? ? ?Meds:  ?Meds ordered this encounter  ?Medications  ? glyBURIDE (DIABETA) 2.5 MG tablet  ?  Sig: Take 1 tablet (2.5 mg total) by mouth at bedtime.  ?  Dispense:  30 tablet  ?  Refill:  3  ? ? ?Orders: No orders of the defined types were placed in this encounter. ?  ? ?Labs/procedures today: none ? ?Treatment Plan:  as above ? ? ?Follow-up: Return in about 2 weeks (around 11/19/2021) for [place], with Dr Elonda Husky. ? ? ?No future appointments. ? ?No orders of the defined types were placed in this encounter. ? ?Florian Buff  ?Attending Physician for the Center for [place] ?[facility] ?11/05/2021 ?11:39 AM ? ?

## 2021-11-19 ENCOUNTER — Encounter: Payer: Self-pay | Admitting: Obstetrics & Gynecology

## 2021-11-19 ENCOUNTER — Ambulatory Visit (INDEPENDENT_AMBULATORY_CARE_PROVIDER_SITE_OTHER): Payer: BC Managed Care – PPO | Admitting: Obstetrics & Gynecology

## 2021-11-19 VITALS — BP 135/81 | HR 104 | Wt 286.0 lb

## 2021-11-19 DIAGNOSIS — I10 Essential (primary) hypertension: Secondary | ICD-10-CM

## 2021-11-19 DIAGNOSIS — O24319 Unspecified pre-existing diabetes mellitus in pregnancy, unspecified trimester: Secondary | ICD-10-CM

## 2021-11-19 DIAGNOSIS — O0992 Supervision of high risk pregnancy, unspecified, second trimester: Secondary | ICD-10-CM

## 2021-11-19 NOTE — Progress Notes (Signed)
? ? ?HIGH-RISK PREGNANCY VISIT ?Patient name: Brianna [name] MRN [id number]  Date of birth: 1990-10-07 ?Chief Complaint:   ?Routine Prenatal Visit ? ?History of Present Illness:   ?Brianna [name] is a 31 y.o. G19P0000 female at [redacted]w[redacted]d with an Estimated Date of Delivery: 03/17/22 being seen today for ongoing management of a high-risk pregnancy complicated by Class B DM and CHTN.   ? ?Today she reports no complaints. Contractions: Not present. Vag. Bleeding: None.  Movement: Present. denies leaking of fluid.  ? ? ?  09/27/2021  ?  1:11 PM 09/13/2021  ? 10:29 AM 05/05/2021  ?  8:28 AM 04/30/2021  ?  9:15 AM 12/25/2020  ? 10:59 AM  ?Depression screen PHQ 2/9  ?Decreased Interest 0 0 0 0 0  ?Down, Depressed, Hopeless 0 0 0 0 0  ?PHQ - 2 Score 0 0 0 0 0  ?Altered sleeping  1     ?Tired, decreased energy  1     ?Change in appetite  0     ?Feeling bad or failure about yourself   0     ?Trouble concentrating  0     ?Moving slowly or fidgety/restless  0     ?Suicidal thoughts  0     ?PHQ-9 Score  2     ? ?  ? ?  09/13/2021  ? 10:30 AM 11/19/2020  ?  9:12 AM  ?GAD 7 : Generalized Anxiety Score  ?Nervous, Anxious, on Edge 0 0  ?Control/stop worrying 0 0  ?Worry too much - different things 0 0  ?Trouble relaxing 0 0  ?Restless 0 0  ?Easily annoyed or irritable 1 0  ?Afraid - awful might happen 0 0  ?Total GAD 7 Score 1 0  ? ? ? ?Review of Systems:   ?Pertinent items are noted in HPI ?Denies abnormal vaginal discharge w/ itching/odor/irritation, headaches, visual changes, shortness of breath, chest pain, abdominal pain, severe nausea/vomiting, or problems with urination or bowel movements unless otherwise stated above. ?Pertinent History Reviewed:  ?Reviewed past medical,surgical, social, obstetrical and family history.  ?Reviewed problem list, medications and allergies. ?Physical Assessment:  ? ?Vitals:  ? 11/19/21 1103  ?BP: 135/81  ?Pulse: (!) 104  ?Weight: 286 lb (129.7 kg)  ?Body mass index is 47.59 kg/m?. ?     ?     Physical  Examination:  ? General appearance: alert, well appearing, and in no distress ? Mental status: alert, oriented to person, place, and time ? Skin: warm & dry  ? Extremities: Edema: None  ?  Cardiovascular: normal heart rate noted ? Respiratory: normal respiratory effort, no distress ? Abdomen: gravid, soft, non-tender ? Pelvic: Cervical exam deferred        ? ?Fetal Status:     Movement: Present   ? ?Fetal Surveillance Testing today: FHR 160  ? ?Chaperone: N/A   ? ?No results found for this or any previous visit (from the past 24 hour(s)).  ?Assessment & Plan:  ?High-risk pregnancy: G1P0000 at [redacted]w[redacted]d with an Estimated Date of Delivery: 03/17/22  ? ? ?  ICD-10-CM   ?1. Supervision of high risk pregnancy in second trimester  O09.92   ?  ?2. Modified White class B pregestational diabetes mellitus  O24.319   ? Good control: metformin 1000 mg BID + glyburide 2.5 qhs  ?  ?3. Chronic hypertension  I10   ? Good control: Labetalol 400 TID  ?  ?  ? ? ?Meds: No orders of the defined types  were placed in this encounter. ? ? ?Orders: No orders of the defined types were placed in this encounter. ?  ? ?Labs/procedures today: none ? ?Treatment Plan:  sonogram 4 weeks for growth CHTN/B DM, begin twice weekly testing 32 weeks ? ? ? ?Follow-up: Return in about 4 weeks (around 12/17/2021) for PN2(without glucola) + sonogram + , HROB. ? ? ?No future appointments. ? ?No orders of the defined types were placed in this encounter. ? ?Lazaro Arms  ?Attending Physician for the Center for [facility] ?[facility] ?11/19/2021 ?11:34 AM ? ?

## 2021-11-24 ENCOUNTER — Other Ambulatory Visit: Payer: Self-pay | Admitting: Nurse Practitioner

## 2021-11-27 ENCOUNTER — Other Ambulatory Visit: Payer: Self-pay | Admitting: Women's Health

## 2021-11-27 ENCOUNTER — Other Ambulatory Visit: Payer: Self-pay | Admitting: Obstetrics & Gynecology

## 2021-11-29 ENCOUNTER — Other Ambulatory Visit: Payer: Self-pay | Admitting: Obstetrics & Gynecology

## 2021-12-09 ENCOUNTER — Encounter: Payer: Self-pay | Admitting: Nurse Practitioner

## 2021-12-14 ENCOUNTER — Encounter: Payer: Self-pay | Admitting: Obstetrics & Gynecology

## 2021-12-15 ENCOUNTER — Other Ambulatory Visit: Payer: Self-pay | Admitting: Obstetrics & Gynecology

## 2021-12-15 DIAGNOSIS — O24112 Pre-existing diabetes mellitus, type 2, in pregnancy, second trimester: Secondary | ICD-10-CM

## 2021-12-15 DIAGNOSIS — O10919 Unspecified pre-existing hypertension complicating pregnancy, unspecified trimester: Secondary | ICD-10-CM

## 2021-12-16 ENCOUNTER — Ambulatory Visit: Payer: BC Managed Care – PPO

## 2021-12-16 ENCOUNTER — Ambulatory Visit (INDEPENDENT_AMBULATORY_CARE_PROVIDER_SITE_OTHER): Payer: BC Managed Care – PPO | Admitting: Obstetrics & Gynecology

## 2021-12-16 ENCOUNTER — Ambulatory Visit (INDEPENDENT_AMBULATORY_CARE_PROVIDER_SITE_OTHER): Payer: BC Managed Care – PPO

## 2021-12-16 VITALS — BP 149/89 | HR 94 | Wt 258.0 lb

## 2021-12-16 DIAGNOSIS — O0993 Supervision of high risk pregnancy, unspecified, third trimester: Secondary | ICD-10-CM

## 2021-12-16 DIAGNOSIS — Z3A27 27 weeks gestation of pregnancy: Secondary | ICD-10-CM

## 2021-12-16 DIAGNOSIS — I10 Essential (primary) hypertension: Secondary | ICD-10-CM

## 2021-12-16 DIAGNOSIS — O24112 Pre-existing diabetes mellitus, type 2, in pregnancy, second trimester: Secondary | ICD-10-CM | POA: Diagnosis not present

## 2021-12-16 DIAGNOSIS — O10919 Unspecified pre-existing hypertension complicating pregnancy, unspecified trimester: Secondary | ICD-10-CM | POA: Diagnosis not present

## 2021-12-16 DIAGNOSIS — Z23 Encounter for immunization: Secondary | ICD-10-CM

## 2021-12-16 DIAGNOSIS — E038 Other specified hypothyroidism: Secondary | ICD-10-CM

## 2021-12-16 DIAGNOSIS — Q622 Congenital megaureter: Secondary | ICD-10-CM

## 2021-12-16 DIAGNOSIS — O0992 Supervision of high risk pregnancy, unspecified, second trimester: Secondary | ICD-10-CM

## 2021-12-16 LAB — POCT URINALYSIS DIPSTICK OB
Blood, UA: NEGATIVE
Glucose, UA: NEGATIVE
Leukocytes, UA: NEGATIVE
Nitrite, UA: NEGATIVE
POC,PROTEIN,UA: NEGATIVE

## 2021-12-16 NOTE — Progress Notes (Signed)
HIGH-RISK PREGNANCY VISIT Patient name: Brianna [name] MRN [id number]  Date of birth: 1990-11-02 Chief Complaint:   Routine Prenatal Visit, High Risk Gestation, and Pregnancy Ultrasound  History of Present Illness:   Brianna [name] is a 31 y.o. G65P0000 female at 8w0dwith an Estimated Date of Delivery: 03/17/22 being seen today for ongoing management of a high-risk pregnancy complicated by: Class B DM -currently on glyburide 2.512mand metformin 100084mid -past week sugars well controlled, a few elevated days about 2 wks pt notes she doesn't think she was taking her medication  cHTN-on Labetalol 400m73md.    Today she reports no complaints.   Contractions: Not present. Vag. Bleeding: None.  Movement: Present. denies leaking of fluid.      12/16/2021    3:23 PM 09/27/2021    1:11 PM 09/13/2021   10:29 AM 05/05/2021    8:28 AM 04/30/2021    9:15 AM  Depression screen PHQ 2/9  Decreased Interest 0 0 0 0 0  Down, Depressed, Hopeless 0 0 0 0 0  PHQ - 2 Score 0 0 0 0 0  Altered sleeping 1  1    Tired, decreased energy 1  1    Change in appetite 1  0    Feeling bad or failure about yourself  0  0    Trouble concentrating 0  0    Moving slowly or fidgety/restless 0  0    Suicidal thoughts 0  0    PHQ-9 Score 3  2       Current Outpatient Medications  Medication Instructions   aspirin EC 162 mg, Oral, Daily, Swallow whole.   blood glucose meter kit and supplies KIT Dispense based on patient and insurance preference. Test blood sugar once daily. Diabetes type 2. E11.9   ferrous sulfate 325 mg, Oral, Every other day   glyBURIDE (DIABETA) 2.5 mg, Oral, Daily at bedtime   labetalol (NORMODYNE) 400 mg, Oral, 3 times daily   Lancets (FREESTYLE) lancets Use as instructed   levothyroxine (SYNTHROID) 25 MCG tablet TAKE 1 TABLET BY MOUTH EVERY DAY BEFORE BREAKFAST   metFORMIN (GLUCOPHAGE) 1,000 mg, Oral, 2 times daily with meals   OVER THE COUNTER MEDICATION Vit d<BR>Vit c <BR>elderberry     Prenatal Vit-Fe Fumarate-FA (PRENATAL VITAMIN PO) Oral     Review of Systems:   Pertinent items are noted in HPI Denies abnormal vaginal discharge w/ itching/odor/irritation, headaches, visual changes, shortness of breath, chest pain, abdominal pain, severe nausea/vomiting, or problems with urination or bowel movements unless otherwise stated above. Pertinent History Reviewed:  Reviewed past medical,surgical, social, obstetrical and family history.  Reviewed problem list, medications and allergies. Physical Assessment:   Vitals:   12/16/21 1526 12/16/21 1529  BP: (!) 155/85 (!) 149/89  Pulse: (!) 105 94  Weight: 258 lb (117 kg)   Body mass index is 42.93 kg/m.           Physical Examination:   General appearance: alert, well appearing, and in no distress  Mental status: normal mood, behavior, speech, dress, motor activity, and thought processes  Skin: warm & dry   Extremities: Edema: None    Cardiovascular: normal heart rate noted  Respiratory: normal respiratory effort, no distress  Abdomen: gravid, soft, non-tender  Pelvic: Cervical exam deferred         Fetal Status:     Movement: Present    Fetal Surveillance Testing today: [place]- [place] 2[place]ore[place],breech/cephalic,CX 4.3 cm,posterior fundal placenta gr 2,polyhydramnios,AFI 29.2  cm,bilateral renal pelvic dilatation LK 7.6 mm, RK 11.7 mm with dilated right ureter,FHR 140 bpm,EFW 1128 g 70%,limited ultrasound because of pt body habitus    Chaperone: N/A    Results for orders placed or performed in visit on 12/16/21 (from the past 24 hour(s))  POC Urinalysis Dipstick OB   Collection Time: 12/16/21  3:29 PM  Result Value Ref Range   Color, UA     Clarity, UA     Glucose, UA Negative Negative   Bilirubin, UA     Ketones, UA mod    Spec Grav, UA     Blood, UA neg    pH, UA     POC,PROTEIN,UA Negative Negative, Trace, Small (1+), Moderate (2+), Large (3+), 4+   Urobilinogen, UA     Nitrite, UA neg    Leukocytes, UA Negative  Negative   Appearance     Odor       Assessment & Plan:  High-risk pregnancy: G1P0000 at 37w0dwith an Estimated Date of Delivery: 03/17/22   1) Chronic HTN -per pt she has not yet taken her afternoon meds and is not regularly checking her BP -encouraged pt to check BP either daily or every other day -follow up in one week for BP check and to review BPs -may need to increase current medication -currently asymptomatic -plan for antepartum testing @ 32wks  2) Class B DM -sugars within normal range this past week -encouraged pt to take medication regularly -continue growth q 4wks  3) Polyhydramnios -AFI 29 -plan for BPP weekly @ 32wks, which were already planned due to cHTN -currently asymptomatic  4) Hypothyroidism -continue current medication  5) Congenital megaureter -severe RPD noted -will continue to follow by serial [place] Meds: No orders of the defined types were placed in this encounter.   Labs/procedures today: CBC, CMP, RPR, TSH, Tdap today  Treatment Plan:  as outlined above  Reviewed: Preterm labor symptoms and general obstetric precautions including but not limited to vaginal bleeding, contractions, leaking of fluid and fetal movement were reviewed in detail with the patient.  All questions were answered. Pt has home bp cuff. Check bp weekly, let [place]now if >140/90.   Follow-up: Return in about 3 weeks (around 01/06/2022) for 1 week BP check then 4 wk- BPP/growth/HROB and continue BPP/HROB weekly.   Future Appointments  Date Time Provider [place] 12/23/2021 10:50 AM [facility] NURSE [facility]  01/06/2022 10:50 AM [facility]lorian Buff [facility] 01/13/2022  9:50 AM EFlorian Buff MD [facility]  01/20/2022  3:45 PM [place] [facility] [place] None  01/20/2022  4:30 PM OJanyth Pupa DO [facility]  01/27/2022 10:00 AM [facility] [place] None  01/27/2022 11:10 AM EFlorian Buff MD [facility]  02/03/2022 10:00 AM [facility] - [facility] [place] [facility] None  02/03/2022 10:50 AM EFlorian Buff MD [facility]  02/10/2022 10:00 AM [facility] - [facility] [place] None  02/10/2022 10:50 AM OJanyth Pupa DO [facility]  02/17/2022  9:45 AM [facility] - [facility] [place] None  02/17/2022 10:50 AM OJanyth Pupa DO [facility]  02/24/2022  9:45 AM [facility] - [facility] [place] None  02/24/2022 10:30 AM EFlorian Buff MD [facility]  03/03/2022  9:45 AM [facility] - [facility] [place] None  03/03/2022 10:30 AM OJanyth Pupa DO [facility]  03/10/2022  9:00 AM [facility] - [facility] [place] None  03/10/2022  9:50 AM [place], LMertie Clause MD  [facility]    Orders Placed This Encounter  Procedures   Tdap vaccine greater than or equal to 7yo IM   Comp Met (CMET)   POC Urinalysis Dipstick OB    Janyth Pupa, DO Attending [place], [facility] for [employer], [place]

## 2021-12-16 NOTE — Progress Notes (Signed)
[place] 27 wks,breech/cephalic,CX 4.3 cm,posterior fundal placenta gr 2,polyhydramnios,AFI 29.2 cm,bilateral renal pelvic dilatation LK 7.6 mm, RK 11.7 mm with dilated right ureter,FHR 140 bpm,EFW 1128 g 70%,limited ultrasound because of pt body habitus

## 2021-12-17 LAB — COMPREHENSIVE METABOLIC PANEL
ALT: 26 IU/L (ref 0–32)
AST: 17 IU/L (ref 0–40)
Albumin/Globulin Ratio: 1.4 (ref 1.2–2.2)
Albumin: 3.8 g/dL — ABNORMAL LOW (ref 3.9–5.0)
Alkaline Phosphatase: 89 IU/L (ref 44–121)
BUN/Creatinine Ratio: 14 (ref 9–23)
BUN: 6 mg/dL (ref 6–20)
Bilirubin Total: <0.2 mg/dL (ref 0.0–1.2)
CO2: 20 mmol/L (ref 20–29)
Calcium: 9.2 mg/dL (ref 8.7–10.2)
Chloride: 104 mmol/L (ref 96–106)
Creatinine, Ser: 0.44 mg/dL — ABNORMAL LOW (ref 0.57–1.00)
Globulin, Total: 2.8 g/dL (ref 1.5–4.5)
Glucose: 111 mg/dL — ABNORMAL HIGH (ref 70–99)
Potassium: 4.1 mmol/L (ref 3.5–5.2)
Sodium: 137 mmol/L (ref 134–144)
Total Protein: 6.6 g/dL (ref 6.0–8.5)
eGFR: 133 mL/min/{1.73_m2} (ref >59)

## 2021-12-17 LAB — CBC
Hematocrit: 28.4 % — ABNORMAL LOW (ref 34.0–46.6)
Hemoglobin: 9.3 g/dL — ABNORMAL LOW (ref 11.1–15.9)
MCH: 28.8 pg (ref 26.6–33.0)
MCHC: 32.7 g/dL (ref 31.5–35.7)
MCV: 88 fL (ref 79–97)
Platelets: 235 10*3/uL (ref 150–450)
RBC: 3.23 x10E6/uL — ABNORMAL LOW (ref 3.77–5.28)
RDW: 12.6 % (ref 11.7–15.4)
WBC: 5.5 10*3/uL (ref 3.4–10.8)

## 2021-12-17 LAB — RPR: RPR Ser Ql: NONREACTIVE

## 2021-12-17 LAB — HIV ANTIBODY (ROUTINE TESTING W REFLEX): HIV Screen 4th Generation wRfx: NONREACTIVE

## 2021-12-17 LAB — ANTIBODY SCREEN: Antibody Screen: NEGATIVE

## 2021-12-23 ENCOUNTER — Telehealth: Payer: Self-pay | Admitting: *Deleted

## 2021-12-23 ENCOUNTER — Ambulatory Visit (INDEPENDENT_AMBULATORY_CARE_PROVIDER_SITE_OTHER): Payer: BC Managed Care – PPO | Admitting: *Deleted

## 2021-12-23 DIAGNOSIS — Z0130 Encounter for examination of blood pressure without abnormal findings: Secondary | ICD-10-CM

## 2021-12-23 NOTE — Telephone Encounter (Signed)
-----   Message from Janyth Pupa, DO sent at 12/23/2021 10:54 AM EDT ----- Hgb has not changed.  Can you please confirm that patient is taking iron every other day?  If not, please encourage, if she is taking, please let me know so I can talk to her about IV iron

## 2021-12-23 NOTE — Telephone Encounter (Signed)
Pt aware hgb hasn't changed. Pt states she is not taking iron. I encouraged her to take iron every other day. Pt voiced understanding. [place]

## 2021-12-23 NOTE — Progress Notes (Signed)
   NURSE VISIT- BLOOD PRESSURE CHECK  SUBJECTIVE:  Brianna [name] is a 31 y.o. G17P0000 female here for BP check. She is [redacted]w[redacted]d pregnant    HYPERTENSION ROS:  Pregnant/postpartum:  Severe headaches that don't go away with tylenol/other medicines: No  Visual changes (seeing spots/double/blurred vision) No  Severe pain under right breast breast or in center of upper chest No  Severe nausea/vomiting No  Taking medicines as instructed yes   Patient reports that home blood pressures are 140's/80's.     OBJECTIVE:  BP (!) 147/90   Pulse 97   LMP 06/10/2021   Appearance alert, well appearing, and in no distress.  ASSESSMENT: Pregnancy [redacted]w[redacted]d  blood pressure check  PLAN: Discussed with Dr. Despina Hidden   Recommendations: no changes needed   Follow-up: as scheduled   Annamarie Dawley  12/23/2021 11:11 AM

## 2021-12-28 ENCOUNTER — Ambulatory Visit (INDEPENDENT_AMBULATORY_CARE_PROVIDER_SITE_OTHER): Payer: BC Managed Care – PPO | Admitting: Family Medicine

## 2021-12-28 DIAGNOSIS — G4733 Obstructive sleep apnea (adult) (pediatric): Secondary | ICD-10-CM | POA: Diagnosis not present

## 2021-12-28 NOTE — Progress Notes (Signed)
Subjective:  Patient ID: Brianna [name], female    DOB: [birth year]  Age: 31 y.o. MRN: [id number]  CC: Chief Complaint  Patient presents with   sleep apnea follow up     Patient is currently pregnant, trouble sleeping , requests orders for cpap equipment , had sleep study done approx November of 22     HPI:  31 year old female who is currently [redacted] weeks pregnant with a past medical history of hypertension, type 2 diabetes, hyperlipidemia, hypothyroidism, morbid obesity presents for evaluation of the above.  Patient has documented OSA.  Sleep study was done in August 2022.  Patient states that she did not get CPAP and supplies due to cost.  Patient states that she is having difficulty sleeping now has the means to get the equipment.  She would like CPAP and supplies sent in for her.  It is previously we recommended that she use AutoPap.  Currently her hypertension and diabetes are being managed by OB/GYN due to pregnancy.  No other complaints at this time.  Patient Active Problem List   Diagnosis Date Noted   Asymptomatic bacteriuria during pregnancy in first trimester 09/20/2021   Supervision of high-risk pregnancy 09/13/2021   Obstructive sleep apnea 04/02/2021   Hypothyroidism 12/09/2019   Hyperlipidemia associated with type 2 diabetes mellitus ([place]) 04/19/2017   HNP (herniated nucleus pulposus), lumbar 09/03/2014   Morbid obesity ([place]) 09/03/2014   Type 2 diabetes mellitus ([place]) 09/03/2014   HTN (hypertension) 09/03/2014    Social Hx   Social History   Socioeconomic History   Marital status: Single    Spouse name: Not on file   Number of children: Not on file   Years of education: Not on file   Highest education level: Not on file  Occupational History   Not on file  Tobacco Use   Smoking status: Never   Smokeless tobacco: Never  Vaping Use   Vaping Use: Never used  Substance and Sexual Activity   Alcohol use: Not Currently   Drug use: No   Sexual activity: Yes     Birth control/protection: None  Other Topics Concern   Not on file  Social History Narrative   Not on file   Social Determinants of Health   Financial Resource Strain: Low Risk    Difficulty of Paying Living Expenses: Not hard at all  Food Insecurity: No Food Insecurity   Worried About Charity fundraiser in the Last Year: Never true   Ran Out of Food in the Last Year: Never true  Transportation Needs: No Transportation Needs   Lack of Transportation (Medical): No   Lack of Transportation (Non-Medical): No  Physical Activity: Insufficiently Active   Days of Exercise per Week: 1 day   Minutes of Exercise per Session: 10 min  Stress: No Stress Concern Present   Feeling of Stress : Only a little  Social Connections: Moderately Isolated   Frequency of Communication with Friends and Family: More than three times a week   Frequency of Social Gatherings with Friends and Family: Twice a week   Attends Religious Services: 1 to 4 times per year   Active Member of [employer] or Organizations: No   Attends Archivist Meetings: Never   Marital Status: Never married    Review of Systems  Constitutional: Negative.   Psychiatric/Behavioral:  Positive for sleep disturbance.     Objective:  BP (!) 145/89   Pulse 97   Temp 98.2 F (36.8 C)  Ht 5\' 5"  (1.651 m)   Wt 286 lb (129.7 kg)   LMP 06/10/2021   SpO2 99%   BMI 47.59 kg/m      12/28/2021    9:12 AM 12/28/2021    8:57 AM 12/23/2021   11:10 AM  BP/Weight  Systolic BP [id number] Q000111Q  Diastolic BP 89 84 90  Wt. (Lbs)  286   BMI  47.59 kg/m2     Physical Exam Vitals and nursing note reviewed.  Constitutional:      General: She is not in acute distress.    Appearance: She is obese.  HENT:     Head: Normocephalic and atraumatic.  Cardiovascular:     Rate and Rhythm: Normal rate and regular rhythm.  Pulmonary:     Effort: Pulmonary effort is normal. No respiratory distress.     Breath sounds: Normal breath sounds.   Neurological:     Mental Status: She is alert.  Psychiatric:        Mood and Affect: Mood normal.        Behavior: Behavior normal.    Lab Results  Component Value Date   WBC 5.5 12/16/2021   HGB 9.3 (L) 12/16/2021   HCT 28.4 (L) 12/16/2021   PLT 235 12/16/2021   GLUCOSE 111 (H) 12/16/2021   CHOL 171 12/25/2020   TRIG 39 12/25/2020   HDL 45 12/25/2020   LDLCALC 118 (H) 12/25/2020   ALT 26 12/16/2021   AST 17 12/16/2021   NA 137 12/16/2021   K 4.1 12/16/2021   CL 104 12/16/2021   CREATININE 0.44 (L) 12/16/2021   BUN 6 12/16/2021   CO2 20 12/16/2021   TSH 4.080 12/16/2021   HGBA1C 5.7 (H) 07/20/2021     Assessment & Plan:   Problem List Items Addressed This Visit       Respiratory   Obstructive sleep apnea    Arranging for CPAP supplies through [facility].  Advise close follow-up with OB/GYN.       [place]

## 2021-12-28 NOTE — Patient Instructions (Signed)
Please follow-up closely with OB/GYN.  We are working with [facility] and they will bring the supplies to your house.  Take care  Dr. Adriana Simas

## 2021-12-28 NOTE — Assessment & Plan Note (Signed)
Arranging for CPAP supplies through [facility].  Advise close follow-up with OB/GYN.

## 2021-12-29 ENCOUNTER — Encounter: Payer: Self-pay | Admitting: Obstetrics & Gynecology

## 2021-12-31 ENCOUNTER — Other Ambulatory Visit: Payer: Self-pay | Admitting: Obstetrics & Gynecology

## 2021-12-31 LAB — SPECIMEN STATUS REPORT

## 2022-01-06 ENCOUNTER — Encounter: Payer: BC Managed Care – PPO | Admitting: Obstetrics & Gynecology

## 2022-01-06 ENCOUNTER — Ambulatory Visit (INDEPENDENT_AMBULATORY_CARE_PROVIDER_SITE_OTHER): Payer: BC Managed Care – PPO | Admitting: Obstetrics & Gynecology

## 2022-01-06 VITALS — BP 140/88 | HR 94 | Wt 288.0 lb

## 2022-01-06 DIAGNOSIS — I10 Essential (primary) hypertension: Secondary | ICD-10-CM

## 2022-01-06 DIAGNOSIS — O24319 Unspecified pre-existing diabetes mellitus in pregnancy, unspecified trimester: Secondary | ICD-10-CM

## 2022-01-06 DIAGNOSIS — E038 Other specified hypothyroidism: Secondary | ICD-10-CM

## 2022-01-06 DIAGNOSIS — O0993 Supervision of high risk pregnancy, unspecified, third trimester: Secondary | ICD-10-CM

## 2022-01-06 DIAGNOSIS — O403XX0 Polyhydramnios, third trimester, not applicable or unspecified: Secondary | ICD-10-CM

## 2022-01-06 DIAGNOSIS — N1330 Unspecified hydronephrosis: Secondary | ICD-10-CM

## 2022-01-06 DIAGNOSIS — Q622 Congenital megaureter: Secondary | ICD-10-CM

## 2022-01-06 DIAGNOSIS — Z3A30 30 weeks gestation of pregnancy: Secondary | ICD-10-CM

## 2022-01-06 LAB — POCT URINALYSIS DIPSTICK OB
Blood, UA: NEGATIVE
Glucose, UA: NEGATIVE
Ketones, UA: NEGATIVE
Leukocytes, UA: NEGATIVE
Nitrite, UA: NEGATIVE
POC,PROTEIN,UA: NEGATIVE

## 2022-01-06 NOTE — Progress Notes (Signed)
HIGH-RISK PREGNANCY VISIT Patient name: Brianna [name] MRN [id number]  Date of birth: December 24, 1990 Chief Complaint:   Routine Prenatal Visit  History of Present Illness:   Brianna [name] is a 31 y.o. G24P0000 female at [redacted]w[redacted]d with an Estimated Date of Delivery: 03/17/22 being seen today for ongoing management of a high-risk pregnancy complicated by Class B/T2DM, CHTN.    Today she reports no complaints. Contractions: Not present. Vag. Bleeding: None.  Movement: Present. denies leaking of fluid.      12/16/2021    3:23 PM 09/27/2021    1:11 PM 09/13/2021   10:29 AM 05/05/2021    8:28 AM 04/30/2021    9:15 AM  Depression screen PHQ 2/9  Decreased Interest 0 0 0 0 0  Down, Depressed, Hopeless 0 0 0 0 0  PHQ - 2 Score 0 0 0 0 0  Altered sleeping 1  1    Tired, decreased energy 1  1    Change in appetite 1  0    Feeling bad or failure about yourself  0  0    Trouble concentrating 0  0    Moving slowly or fidgety/restless 0  0    Suicidal thoughts 0  0    PHQ-9 Score 3  2          12/16/2021    3:24 PM 09/13/2021   10:30 AM 11/19/2020    9:12 AM  GAD 7 : Generalized Anxiety Score  Nervous, Anxious, on Edge 0 0 0  Control/stop worrying 0 0 0  Worry too much - different things 0 0 0  Trouble relaxing 0 0 0  Restless 0 0 0  Easily annoyed or irritable 0 1 0  Afraid - awful might happen 0 0 0  Total GAD 7 Score 0 1 0     Review of Systems:   Pertinent items are noted in HPI Denies abnormal vaginal discharge w/ itching/odor/irritation, headaches, visual changes, shortness of breath, chest pain, abdominal pain, severe nausea/vomiting, or problems with urination or bowel movements unless otherwise stated above. Pertinent History Reviewed:  Reviewed past medical,surgical, social, obstetrical and family history.  Reviewed problem list, medications and allergies. Physical Assessment:   Vitals:   01/06/22 0919  BP: 140/88  Pulse: 94  Weight: 288 lb (130.6 kg)  Body mass index is  47.93 kg/m.           Physical Examination:   General appearance: alert, well appearing, and in no distress  Mental status: alert, oriented to person, place, and time  Skin: warm & dry   Extremities: Edema: None    Cardiovascular: normal heart rate noted  Respiratory: normal respiratory effort, no distress  Abdomen: gravid, soft, non-tender  Pelvic: Cervical exam deferred         Fetal Status:     Movement: Present    Fetal Surveillance Testing today: FHR 146   Chaperone: N/A    Results for orders placed or performed in visit on 01/06/22 (from the past 24 hour(s))  POC Urinalysis Dipstick OB   Collection Time: 01/06/22  9:23 AM  Result Value Ref Range   Color, UA     Clarity, UA     Glucose, UA Negative Negative   Bilirubin, UA     Ketones, UA neg    Spec Grav, UA     Blood, UA neg    pH, UA     POC,PROTEIN,UA Negative Negative, Trace, Small (1+), Moderate (2+), Large (3+), 4+  Urobilinogen, UA     Nitrite, UA neg    Leukocytes, UA Negative Negative   Appearance     Odor      Assessment & Plan:  High-risk pregnancy: G1P0000 at [redacted]w[redacted]d with an Estimated Date of Delivery: 03/17/22      ICD-10-CM   1. High-risk pregnancy in third trimester  O09.93 POC Urinalysis Dipstick OB    2. Chronic hypertension  I10    labetalol 400 TID,stable    3. Class B/T2DM  O24.319    Metformin 1000 mg BID, glyburide 2.5 mg qhs    4. Fetal Renal pelvic dilation, left  N13.30     5. 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 Hypothyroidism  E03.8    synthroid 25 micrograms    7. Polyhydramnios affecting pregnancy in third trimester, moderate 29 cm  O40.3XX0     8. [redacted] weeks gestation of pregnancy  Z3A.30 POC Urinalysis Dipstick OB        Meds: No orders of the defined types were placed in this encounter.   Orders:  Orders Placed This Encounter  Procedures   POC Urinalysis Dipstick OB     Labs/procedures today: none  Treatment Plan:  begin twice weekly surveillance 32  weeks   Follow-up: No follow-ups on file.   Future Appointments  Date Time Provider Department Center  01/13/2022  9:50 AM Lazaro Arms, MD [facility]  01/20/2022  3:45 PM [facility] [place] [facility] None  01/20/2022  4:30 PM Myna Hidalgo, DO [facility]  01/24/2022  9:10 AM [facility] NURSE [facility]  01/27/2022 10:00 AM [facility] [place] [facility] None  01/27/2022 11:10 AM Lazaro Arms, MD [facility]  01/31/2022 10:10 AM [facility] NURSE [facility]  02/03/2022 10:00 AM [facility] [place] [facility] None  02/03/2022 10:50 AM Lazaro Arms, MD [facility]  02/07/2022  9:30 AM [facility] NURSE [facility]  02/10/2022 10:00 AM [facility] [place] [facility] None  02/10/2022 10:50 AM Myna Hidalgo, DO [facility]  02/14/2022  9:50 AM [facility] NURSE [facility]  02/17/2022  9:45 AM [facility] [place] [facility] None  02/17/2022 10:50 AM Myna Hidalgo, DO [facility]  02/21/2022  9:50 AM [facility] NURSE [facility]  02/24/2022  9:45 AM [facility] [place] [facility] None  02/24/2022 10:30 AM Lazaro Arms, MD [facility]  02/28/2022 10:30 AM [facility] NURSE [facility]  03/03/2022  9:45 AM [facility] [place] [facility] None  03/03/2022 10:30 AM Myna Hidalgo, DO [facility]  03/07/2022 10:30 AM [facility] NURSE [facility]  03/10/2022  9:00 AM [facility] [place] [facility] None  03/10/2022  9:50 AM Cresenzo-Dishmon, Scarlette Calico, CNM [facility]    Orders Placed This Encounter  Procedures   POC Urinalysis Dipstick OB   Lazaro Arms  Attending Physician for the Center for [facility] 01/06/2022 12:17 PM

## 2022-01-08 LAB — SPECIMEN STATUS REPORT

## 2022-01-08 LAB — TSH: TSH: 4.08 u[IU]/mL (ref 0.450–4.500)

## 2022-01-13 ENCOUNTER — Ambulatory Visit (INDEPENDENT_AMBULATORY_CARE_PROVIDER_SITE_OTHER): Payer: BC Managed Care – PPO | Admitting: Obstetrics & Gynecology

## 2022-01-13 ENCOUNTER — Encounter: Payer: Self-pay | Admitting: Obstetrics & Gynecology

## 2022-01-13 VITALS — BP 143/96 | HR 95 | Wt 290.5 lb

## 2022-01-13 DIAGNOSIS — O0993 Supervision of high risk pregnancy, unspecified, third trimester: Secondary | ICD-10-CM

## 2022-01-13 DIAGNOSIS — O10919 Unspecified pre-existing hypertension complicating pregnancy, unspecified trimester: Secondary | ICD-10-CM

## 2022-01-13 DIAGNOSIS — E119 Type 2 diabetes mellitus without complications: Secondary | ICD-10-CM

## 2022-01-13 DIAGNOSIS — N1330 Unspecified hydronephrosis: Secondary | ICD-10-CM

## 2022-01-13 DIAGNOSIS — Q622 Congenital megaureter: Secondary | ICD-10-CM

## 2022-01-13 LAB — POCT URINALYSIS DIPSTICK OB
Blood, UA: NEGATIVE
Glucose, UA: NEGATIVE
Ketones, UA: NEGATIVE
Leukocytes, UA: NEGATIVE
Nitrite, UA: NEGATIVE
POC,PROTEIN,UA: NEGATIVE

## 2022-01-13 MED ORDER — LABETALOL HCL 300 MG PO TABS: 600.0000 mg | ORAL_TABLET | Freq: Three times a day (TID) | ORAL | 11 refills | Status: DC

## 2022-01-19 ENCOUNTER — Other Ambulatory Visit: Payer: Self-pay | Admitting: Obstetrics & Gynecology

## 2022-01-19 DIAGNOSIS — O24113 Pre-existing diabetes mellitus, type 2, in pregnancy, third trimester: Secondary | ICD-10-CM

## 2022-01-19 DIAGNOSIS — O10919 Unspecified pre-existing hypertension complicating pregnancy, unspecified trimester: Secondary | ICD-10-CM

## 2022-01-20 ENCOUNTER — Ambulatory Visit (INDEPENDENT_AMBULATORY_CARE_PROVIDER_SITE_OTHER): Payer: BC Managed Care – PPO

## 2022-01-20 ENCOUNTER — Encounter: Payer: Self-pay | Admitting: Obstetrics & Gynecology

## 2022-01-20 ENCOUNTER — Ambulatory Visit (INDEPENDENT_AMBULATORY_CARE_PROVIDER_SITE_OTHER): Payer: BC Managed Care – PPO | Admitting: Obstetrics & Gynecology

## 2022-01-20 VITALS — BP 148/88 | HR 96 | Wt 286.0 lb

## 2022-01-20 DIAGNOSIS — O24113 Pre-existing diabetes mellitus, type 2, in pregnancy, third trimester: Secondary | ICD-10-CM | POA: Diagnosis not present

## 2022-01-20 DIAGNOSIS — Q622 Congenital megaureter: Secondary | ICD-10-CM

## 2022-01-20 DIAGNOSIS — O10919 Unspecified pre-existing hypertension complicating pregnancy, unspecified trimester: Secondary | ICD-10-CM | POA: Diagnosis not present

## 2022-01-20 DIAGNOSIS — I10 Essential (primary) hypertension: Secondary | ICD-10-CM

## 2022-01-20 DIAGNOSIS — E119 Type 2 diabetes mellitus without complications: Secondary | ICD-10-CM

## 2022-01-20 DIAGNOSIS — O0993 Supervision of high risk pregnancy, unspecified, third trimester: Secondary | ICD-10-CM

## 2022-01-20 DIAGNOSIS — Z3A32 32 weeks gestation of pregnancy: Secondary | ICD-10-CM | POA: Diagnosis not present

## 2022-01-20 DIAGNOSIS — N1330 Unspecified hydronephrosis: Secondary | ICD-10-CM

## 2022-01-20 LAB — POCT URINALYSIS DIPSTICK OB
Blood, UA: NEGATIVE
Glucose, UA: NEGATIVE
Ketones, UA: NEGATIVE
Leukocytes, UA: NEGATIVE
Nitrite, UA: NEGATIVE
POC,PROTEIN,UA: NEGATIVE

## 2022-01-20 MED ORDER — LABETALOL HCL 200 MG PO TABS: 800.0000 mg | ORAL_TABLET | Freq: Three times a day (TID) | ORAL | 6 refills | Status: DC

## 2022-01-20 NOTE — Progress Notes (Signed)
HIGH-RISK PREGNANCY VISIT Patient name: Brianna [name] MRN [id number]  Date of birth: Apr 28, 1991 Chief Complaint:   Routine Prenatal Visit (BPP)  History of Present Illness:   Brianna [name] is a 31 y.o. G100P0000 female at 47w0dwith an Estimated Date of Delivery: 03/17/22 being seen today for ongoing management of a high-risk pregnancy complicated by: -chronic HTN Last visit increased to 6066mtid- minimal to no change Currently asymptomatic -Class B DM Well controled, lows in the am -Hypothyroidism -Fetal renal abnormalities -Sleep apnea- getting machine July 5  Today she reports no complaints.   Contractions: Not present. Vag. Bleeding: None.  Movement: Present. denies leaking of fluid.      12/16/2021    3:23 PM 09/27/2021    1:11 PM 09/13/2021   10:29 AM 05/05/2021    8:28 AM 04/30/2021    9:15 AM  Depression screen PHQ 2/9  Decreased Interest 0 0 0 0 0  Down, Depressed, Hopeless 0 0 0 0 0  PHQ - 2 Score 0 0 0 0 0  Altered sleeping 1  1    Tired, decreased energy 1  1    Change in appetite 1  0    Feeling bad or failure about yourself  0  0    Trouble concentrating 0  0    Moving slowly or fidgety/restless 0  0    Suicidal thoughts 0  0    PHQ-9 Score 3  2       Current Outpatient Medications  Medication Instructions   aspirin EC 162 mg, Oral, Daily, Swallow whole.   blood glucose meter kit and supplies KIT Dispense based on patient and insurance preference. Test blood sugar once daily. Diabetes type 2. E11.9   ferrous sulfate 325 mg, Oral, Every other day   glyBURIDE (DIABETA) 2.5 MG tablet TAKE 1 TABLET BY MOUTH AT BEDTIME.   labetalol (NORMODYNE) 600 mg, Oral, 3 times daily   Lancets (FREESTYLE) lancets Use as instructed   levothyroxine (SYNTHROID) 25 MCG tablet TAKE 1 TABLET BY MOUTH EVERY DAY BEFORE BREAKFAST   metFORMIN (GLUCOPHAGE) 1000 MG tablet TAKE 1 TABLET (1,000 MG TOTAL) BY MOUTH TWICE A DAY WITH FOOD   OVER THE COUNTER MEDICATION Vit d<BR>Vit c  <BR>elderberry    Prenatal Vit-Fe Fumarate-FA (PRENATAL VITAMIN PO) Oral     Review of Systems:   Pertinent items are noted in HPI Denies abnormal vaginal discharge w/ itching/odor/irritation, headaches, visual changes, shortness of breath, chest pain, abdominal pain, severe nausea/vomiting, or problems with urination or bowel movements unless otherwise stated above. Pertinent History Reviewed:  Reviewed past medical,surgical, social, obstetrical and family history.  Reviewed problem list, medications and allergies. Physical Assessment:   Vitals:   01/20/22 1627 01/20/22 1630  BP: (!) 154/91 (!) 148/88  Pulse: 92 96  Weight: 286 lb (129.7 kg)   Body mass index is 47.59 kg/m.           Physical Examination:   General appearance: alert, well appearing, and in no distress  Mental status: normal mood, behavior, speech, dress, motor activity, and thought processes  Skin: warm & dry   Extremities: Edema: None    Cardiovascular: normal heart rate noted  Respiratory: normal respiratory effort, no distress  Abdomen: gravid, soft, non-tender  Pelvic: Cervical exam deferred         Fetal Status:     Movement: Present    Fetal Surveillance Testing today: ,breech,posterior placenta gr 3,FHR 148 bpm,BPP 8/8,CX 4.3 cm,bilateral renal pelvic dilatation,LK  9.2 mm,RK 10.1 mm,dilated proximal left ureter,limited view of right ureter, AFI 23.5 cm,RI .77,.78,.80=99%,elevated UAD with EDF,EFW 2349 g 94%   Chaperone: N/A    Results for orders placed or performed in visit on 01/20/22 (from the past 24 hour(s))  POC Urinalysis Dipstick OB   Collection Time: 01/20/22  4:29 PM  Result Value Ref Range   Color, UA     Clarity, UA     Glucose, UA Negative Negative   Bilirubin, UA     Ketones, UA neg    Spec Grav, UA     Blood, UA neg    pH, UA     POC,PROTEIN,UA Negative Negative, Trace, Small (1+), Moderate (2+), Large (3+), 4+   Urobilinogen, UA     Nitrite, UA neg    Leukocytes, UA Negative  Negative   Appearance     Odor       Assessment & Plan:  High-risk pregnancy: G1P0000 at 53w0dwith an Estimated Date of Delivery: 03/17/22   1) chronic HTN -increased Labetalol to 8061mtid -currently asymptomatic -elevated dopplers noted, will continue to follow weekly -reviewed plan for early IOL 37wk pending BP management - 2) Class B Dm -no change with current medications -sugars well controlled with metformin and glyburide  3) Polyhydramnios Improved today, AFI 23  Meds: No orders of the defined types were placed in this encounter.   Labs/procedures today: [place]/doppler  Treatment Plan:  as outlined above  Reviewed: Preterm labor symptoms and general obstetric precautions including but not limited to vaginal bleeding, contractions, leaking of fluid and fetal movement were reviewed in detail with the patient.  All questions were answered. Pt has home bp cuff. Check bp weekly, let usKoreanow if >140/90.   Follow-up: No follow-ups on file.   Future Appointments  Date Time Provider [place]7/09/2021  9:10 AM [facility] NURSE [facility]  01/27/2022 10:00 AM [facility] [place] None  01/27/2022 11:10 AM EuFlorian BuffMD [facility]  01/31/2022 10:10 AM [facility] NURSE [facility]  02/03/2022 10:00 AM [facility] [place] None  02/03/2022 10:50 AM EuFlorian BuffMD [facility]  02/07/2022  9:30 AM [facility] NURSE [facility]  02/10/2022 10:00 AM [facility] [place] None  02/10/2022 10:50 AM OzJanyth PupaDO [facility]  02/14/2022  9:50 AM [facility] NURSE [facility]  02/17/2022 10:00 AM [facility] [place] None  02/17/2022 10:50 AM OzJanyth PupaDO [facility]  02/21/2022  9:50 AM [facility] NURSE [facility]  02/24/2022  9:45 AM [facility] [place] None  02/24/2022 10:30 AM EuFlorian BuffMD [facility]  02/28/2022 10:30 AM [facility] NURSE [facility]  03/03/2022  9:45 AM [facility] [place] None  03/03/2022 10:30 AM OzJanyth PupaDO [facility]  03/07/2022 10:30 AM [facility] NURSE [facility]  03/10/2022  9:00 AM [facility] [place] None  03/10/2022  9:50 AM Cresenzo-Dishmon, FrJoaquim LaiCNM [facility]    Orders Placed This Encounter  Procedures   POC Urinalysis Dipstick OB    JeJanyth PupaDO Attending ObPaincourtvilleFa[place] [facility] [place]

## 2022-01-20 NOTE — Progress Notes (Signed)
[place] 32 wks,breech,posterior placenta gr 3,FHR 148 bpm,BPP 8/8,CX 4.3 cm,bilateral renal pelvic dilatation,LK 9.2 mm,RK 10.1 mm,dilated proximal left ureter,limited view of right ureter, AFI 23.5 cm,RI .77,.78,.80=99%,elevated UAD with EDF,EFW 2349 g 94%

## 2022-01-24 ENCOUNTER — Ambulatory Visit (INDEPENDENT_AMBULATORY_CARE_PROVIDER_SITE_OTHER): Payer: BC Managed Care – PPO | Admitting: *Deleted

## 2022-01-24 VITALS — BP 138/84 | HR 95 | Wt 284.0 lb

## 2022-01-24 DIAGNOSIS — I10 Essential (primary) hypertension: Secondary | ICD-10-CM | POA: Diagnosis not present

## 2022-01-24 DIAGNOSIS — O0993 Supervision of high risk pregnancy, unspecified, third trimester: Secondary | ICD-10-CM

## 2022-01-24 DIAGNOSIS — Z3A32 32 weeks gestation of pregnancy: Secondary | ICD-10-CM

## 2022-01-24 DIAGNOSIS — E119 Type 2 diabetes mellitus without complications: Secondary | ICD-10-CM

## 2022-01-24 LAB — POCT URINALYSIS DIPSTICK OB
Blood, UA: NEGATIVE
Glucose, UA: NEGATIVE
Ketones, UA: NEGATIVE
Leukocytes, UA: NEGATIVE
Nitrite, UA: NEGATIVE
POC,PROTEIN,UA: NEGATIVE

## 2022-01-24 NOTE — Progress Notes (Signed)
   NURSE VISIT- NST  SUBJECTIVE:  Brianna [name] is a 31 y.o. G54P0000 female at [redacted]w[redacted]d, here for a NST for pregnancy complicated by [facility] and T2DM: Class B, currently on Glyburide and Metformin .  She reports active fetal movement, contractions: none, vaginal bleeding: none, membranes: intact.   OBJECTIVE:  LMP 06/10/2021   Appears well, no apparent distress  Results for orders placed or performed in visit on 01/24/22 (from the past 24 hour(s))  POC Urinalysis Dipstick OB   Collection Time: 01/24/22  9:24 AM  Result Value Ref Range   Color, UA     Clarity, UA     Glucose, UA Negative Negative   Bilirubin, UA     Ketones, UA neg    Spec Grav, UA     Blood, UA neg    pH, UA     POC,PROTEIN,UA Negative Negative, Trace, Small (1+), Moderate (2+), Large (3+), 4+   Urobilinogen, UA     Nitrite, UA neg    Leukocytes, UA Negative Negative   Appearance     Odor      NST: FHR baseline 140 bpm, Variability: moderate, Accelerations:present, Decelerations:  Absent= Cat 1/reactive Toco: none   ASSESSMENT: G1P0000 at [redacted]w[redacted]d with CHTN and T2DM: Class B, currently on Glyburide and Metformin NST reactive  PLAN: EFM strip reviewed by Joellyn Haff, CNM, [facility]   Recommendations: keep next appointment as scheduled    Jobe Marker  01/24/2022 9:24 AM

## 2022-01-26 ENCOUNTER — Other Ambulatory Visit: Payer: Self-pay | Admitting: Obstetrics & Gynecology

## 2022-01-26 DIAGNOSIS — G4733 Obstructive sleep apnea (adult) (pediatric): Secondary | ICD-10-CM | POA: Diagnosis not present

## 2022-01-26 DIAGNOSIS — O24113 Pre-existing diabetes mellitus, type 2, in pregnancy, third trimester: Secondary | ICD-10-CM

## 2022-01-26 DIAGNOSIS — O10919 Unspecified pre-existing hypertension complicating pregnancy, unspecified trimester: Secondary | ICD-10-CM

## 2022-01-27 ENCOUNTER — Ambulatory Visit (INDEPENDENT_AMBULATORY_CARE_PROVIDER_SITE_OTHER): Payer: BC Managed Care – PPO

## 2022-01-27 ENCOUNTER — Ambulatory Visit (INDEPENDENT_AMBULATORY_CARE_PROVIDER_SITE_OTHER): Payer: BC Managed Care – PPO | Admitting: Obstetrics & Gynecology

## 2022-01-27 ENCOUNTER — Encounter: Payer: Self-pay | Admitting: Obstetrics & Gynecology

## 2022-01-27 VITALS — BP 146/92 | HR 97 | Wt 291.0 lb

## 2022-01-27 DIAGNOSIS — O10919 Unspecified pre-existing hypertension complicating pregnancy, unspecified trimester: Secondary | ICD-10-CM

## 2022-01-27 DIAGNOSIS — R9389 Abnormal findings on diagnostic imaging of other specified body structures: Secondary | ICD-10-CM

## 2022-01-27 DIAGNOSIS — O24113 Pre-existing diabetes mellitus, type 2, in pregnancy, third trimester: Secondary | ICD-10-CM

## 2022-01-27 DIAGNOSIS — O0993 Supervision of high risk pregnancy, unspecified, third trimester: Secondary | ICD-10-CM

## 2022-01-27 DIAGNOSIS — O24319 Unspecified pre-existing diabetes mellitus in pregnancy, unspecified trimester: Secondary | ICD-10-CM

## 2022-01-27 DIAGNOSIS — Z3A33 33 weeks gestation of pregnancy: Secondary | ICD-10-CM

## 2022-01-27 LAB — POCT URINALYSIS DIPSTICK OB
Blood, UA: NEGATIVE
Glucose, UA: NEGATIVE
Ketones, UA: NEGATIVE
Leukocytes, UA: NEGATIVE
Nitrite, UA: NEGATIVE

## 2022-01-27 NOTE — Progress Notes (Signed)
HIGH-RISK PREGNANCY VISIT Patient name: Brianna [name] MRN [id number]  Date of birth: 1991/07/15 Chief Complaint:   Routine Prenatal Visit  History of Present Illness:   Brianna [name] is a 31 y.o. G87P0000 female at [redacted]w[redacted]d with an Estimated Date of Delivery: 03/17/22 being seen today for ongoing management of a high-risk pregnancy complicated by chronic hypertension currently on labetalol 800 TID and diabetes mellitus T2DM: Class B, currently on metformin 1000 mg breakfast/supper glyburide 2.5 mg qhs .    Today she reports no complaints. Contractions: Not present. Vag. Bleeding: None.  Movement: Present. denies leaking of fluid.      12/16/2021    3:23 PM 09/27/2021    1:11 PM 09/13/2021   10:29 AM 05/05/2021    8:28 AM 04/30/2021    9:15 AM  Depression screen PHQ 2/9  Decreased Interest 0 0 0 0 0  Down, Depressed, Hopeless 0 0 0 0 0  PHQ - 2 Score 0 0 0 0 0  Altered sleeping 1  1    Tired, decreased energy 1  1    Change in appetite 1  0    Feeling bad or failure about yourself  0  0    Trouble concentrating 0  0    Moving slowly or fidgety/restless 0  0    Suicidal thoughts 0  0    PHQ-9 Score 3  2          12/16/2021    3:24 PM 09/13/2021   10:30 AM 11/19/2020    9:12 AM  GAD 7 : Generalized Anxiety Score  Nervous, Anxious, on Edge 0 0 0  Control/stop worrying 0 0 0  Worry too much - different things 0 0 0  Trouble relaxing 0 0 0  Restless 0 0 0  Easily annoyed or irritable 0 1 0  Afraid - awful might happen 0 0 0  Total GAD 7 Score 0 1 0     Review of Systems:   Pertinent items are noted in HPI Denies abnormal vaginal discharge w/ itching/odor/irritation, headaches, visual changes, shortness of breath, chest pain, abdominal pain, severe nausea/vomiting, or problems with urination or bowel movements unless otherwise stated above. Pertinent History Reviewed:  Reviewed past medical,surgical, social, obstetrical and family history.  Reviewed problem list, medications  and allergies. Physical Assessment:   Vitals:   01/27/22 1049 01/27/22 1056  BP: (!) 144/94 (!) 146/92  Pulse: 94 97  Weight: 291 lb (132 kg)   Body mass index is 48.42 kg/m.           Physical Examination:   General appearance: alert, well appearing, and in no distress  Mental status: alert, oriented to person, place, and time  Skin: warm & dry   Extremities: Edema: None    Cardiovascular: normal heart rate noted  Respiratory: normal respiratory effort, no distress  Abdomen: gravid, soft, non-tender  Pelvic: Cervical exam deferred         Fetal Status:     Movement: Present    Fetal Surveillance Testing today: BPP 8/8 23 cm AFI 99% Dopplers   Chaperone: N/A    Results for orders placed or performed in visit on 01/27/22 (from the past 24 hour(s))  POC Urinalysis Dipstick OB   Collection Time: 01/27/22 10:53 AM  Result Value Ref Range   Color, UA     Clarity, UA     Glucose, UA Negative Negative   Bilirubin, UA     Ketones, UA neg  Spec Grav, UA     Blood, UA neg    pH, UA     POC,PROTEIN,UA Trace Negative, Trace, Small (1+), Moderate (2+), Large (3+), 4+   Urobilinogen, UA     Nitrite, UA neg    Leukocytes, UA Negative Negative   Appearance     Odor      Assessment & Plan:  High-risk pregnancy: G1P0000 at [redacted]w[redacted]d with an Estimated Date of Delivery: 03/17/22      ICD-10-CM   1. Supervision of high risk pregnancy in third trimester  O09.93 POC Urinalysis Dipstick OB    2. [redacted] weeks gestation of pregnancy  Z3A.33 POC Urinalysis Dipstick OB    3. Chronic hypertension affecting pregnancy  O10.919     4. Class B/T2DM  O24.319     5. Elevated Fetal Doppler flow studies, 99% 01/27/22  R93.89         Meds: No orders of the defined types were placed in this encounter.   Orders:  Orders Placed This Encounter  Procedures   POC Urinalysis Dipstick OB     Labs/procedures today: U/S  Treatment Plan:  twice weekly surveillance, IOL 37 weeks unless otherwise  clinically indicated  Reviewed: Preterm labor symptoms and general obstetric precautions including but not limited to vaginal bleeding, contractions, leaking of fluid and fetal movement were reviewed in detail with the patient.  All questions were answered. Does have home bp cuff. Office bp cuff given: yes. Check bp twice daily, let us know if consistently >150 and/or >95.  Follow-up: Return for keep scheduled.   Future Appointments  Date Time Provider Department Center  01/31/2022 10:10 AM [facility] NURSE [facility]  02/03/2022 10:00 AM [facility] [place] [facility] None  02/03/2022 10:50 AM Lazaro Arms, MD [facility]  02/07/2022  9:30 AM [facility] NURSE [facility]  02/10/2022 10:00 AM [facility] [place] [facility] None  02/10/2022 10:50 AM Myna Hidalgo, DO [facility]  02/14/2022  9:50 AM [facility] NURSE [facility]  02/17/2022 10:00 AM [facility] [place] [facility] None  02/17/2022 10:50 AM Myna Hidalgo, DO [facility]  02/21/2022  9:50 AM [facility] NURSE [facility]  02/24/2022  9:45 AM [facility] [place] [facility] None  02/24/2022 10:30 AM Lazaro Arms, MD [facility]  02/28/2022 10:30 AM [facility] NURSE [facility]  03/03/2022  9:45 AM [facility] [place] [facility] None  03/03/2022 10:30 AM Myna Hidalgo, DO [facility]  03/07/2022 10:30 AM [facility] NURSE [facility]  03/10/2022  9:00 AM [facility] [place] [facility] None  03/10/2022  9:50 AM Cresenzo-Dishmon, Scarlette Calico, CNM [facility]    Orders Placed This Encounter  Procedures   POC Urinalysis Dipstick OB   Lazaro Arms  Attending Physician for the Center for [facility] 01/27/2022 11:46 AM

## 2022-01-27 NOTE — Progress Notes (Signed)
[place] 33 wks,cephalic,AFI 23 cm,posterior placenta gr 3,BPP 8/8,elevated UA dopplers with EDF,RI .86,.81,.75=99%,FHR 148 bpm,bilateral renal pelvic dilation, RK  8 mm,LK 5.1 mm,limited view

## 2022-01-31 ENCOUNTER — Ambulatory Visit (INDEPENDENT_AMBULATORY_CARE_PROVIDER_SITE_OTHER): Payer: BC Managed Care – PPO | Admitting: *Deleted

## 2022-01-31 VITALS — BP 158/83 | HR 92 | Wt 295.4 lb

## 2022-01-31 DIAGNOSIS — E119 Type 2 diabetes mellitus without complications: Secondary | ICD-10-CM | POA: Diagnosis not present

## 2022-01-31 DIAGNOSIS — I10 Essential (primary) hypertension: Secondary | ICD-10-CM

## 2022-01-31 DIAGNOSIS — O288 Other abnormal findings on antenatal screening of mother: Secondary | ICD-10-CM | POA: Diagnosis not present

## 2022-01-31 DIAGNOSIS — O0993 Supervision of high risk pregnancy, unspecified, third trimester: Secondary | ICD-10-CM | POA: Diagnosis not present

## 2022-01-31 DIAGNOSIS — Z331 Pregnant state, incidental: Secondary | ICD-10-CM

## 2022-01-31 DIAGNOSIS — Z3A33 33 weeks gestation of pregnancy: Secondary | ICD-10-CM

## 2022-01-31 DIAGNOSIS — Z1389 Encounter for screening for other disorder: Secondary | ICD-10-CM

## 2022-01-31 LAB — POCT URINALYSIS DIPSTICK OB
Blood, UA: NEGATIVE
Glucose, UA: NEGATIVE
Ketones, UA: NEGATIVE
Leukocytes, UA: NEGATIVE
Nitrite, UA: NEGATIVE
POC,PROTEIN,UA: NEGATIVE

## 2022-01-31 NOTE — Progress Notes (Signed)
   NURSE VISIT- NST  SUBJECTIVE:  Brianna [name] is a 31 y.o. G33P0000 female at [redacted]w[redacted]d, here for a NST for pregnancy complicated by [facility] and T2DM: Class B, currently on Glyburide and Metformin .  She reports active fetal movement, contractions: none, vaginal bleeding: none, membranes: intact.   OBJECTIVE:  BP (!) 158/83   Pulse 92   Wt 295 lb 6.4 oz (134 kg)   LMP 06/10/2021   BMI 49.16 kg/m   Appears well, no apparent distress  Results for orders placed or performed in visit on 01/31/22 (from the past 24 hour(s))  POC Urinalysis Dipstick OB   Collection Time: 01/31/22 10:31 AM  Result Value Ref Range   Color, UA     Clarity, UA     Glucose, UA Negative Negative   Bilirubin, UA     Ketones, UA neg    Spec Grav, UA     Blood, UA neg    pH, UA     POC,PROTEIN,UA Negative Negative, Trace, Small (1+), Moderate (2+), Large (3+), 4+   Urobilinogen, UA     Nitrite, UA neg    Leukocytes, UA Negative Negative   Appearance     Odor      NST: FHR baseline 145 bpm, Variability: moderate, Accelerations:present, Decelerations:  Absent= Cat 1/reactive Toco: none   ASSESSMENT: G1P0000 at [redacted]w[redacted]d with CHTN and T2DM: Class B, currently on Glyburide and Metformin NST reactive Dr Despina Hidden reviewed BP-continue current regimen  PLAN: EFM strip reviewed by Joellyn Haff, CNM, [facility]   Recommendations: keep next appointment as scheduled    Jobe Marker  01/31/2022 12:36 PM

## 2022-02-01 ENCOUNTER — Encounter: Payer: Self-pay | Admitting: *Deleted

## 2022-02-01 DIAGNOSIS — Z029 Encounter for administrative examinations, unspecified: Secondary | ICD-10-CM

## 2022-02-02 ENCOUNTER — Other Ambulatory Visit: Payer: Self-pay | Admitting: Obstetrics & Gynecology

## 2022-02-02 DIAGNOSIS — O24113 Pre-existing diabetes mellitus, type 2, in pregnancy, third trimester: Secondary | ICD-10-CM

## 2022-02-02 DIAGNOSIS — O10919 Unspecified pre-existing hypertension complicating pregnancy, unspecified trimester: Secondary | ICD-10-CM

## 2022-02-03 ENCOUNTER — Ambulatory Visit (INDEPENDENT_AMBULATORY_CARE_PROVIDER_SITE_OTHER): Payer: BC Managed Care – PPO

## 2022-02-03 ENCOUNTER — Ambulatory Visit (INDEPENDENT_AMBULATORY_CARE_PROVIDER_SITE_OTHER): Payer: BC Managed Care – PPO | Admitting: Obstetrics & Gynecology

## 2022-02-03 ENCOUNTER — Encounter: Payer: Self-pay | Admitting: Obstetrics & Gynecology

## 2022-02-03 ENCOUNTER — Other Ambulatory Visit: Payer: Self-pay | Admitting: Obstetrics & Gynecology

## 2022-02-03 VITALS — BP 142/87 | HR 90 | Wt 296.0 lb

## 2022-02-03 DIAGNOSIS — O10919 Unspecified pre-existing hypertension complicating pregnancy, unspecified trimester: Secondary | ICD-10-CM

## 2022-02-03 DIAGNOSIS — Z3A34 34 weeks gestation of pregnancy: Secondary | ICD-10-CM | POA: Diagnosis not present

## 2022-02-03 DIAGNOSIS — O24319 Unspecified pre-existing diabetes mellitus in pregnancy, unspecified trimester: Secondary | ICD-10-CM

## 2022-02-03 DIAGNOSIS — R9389 Abnormal findings on diagnostic imaging of other specified body structures: Secondary | ICD-10-CM

## 2022-02-03 DIAGNOSIS — O0993 Supervision of high risk pregnancy, unspecified, third trimester: Secondary | ICD-10-CM

## 2022-02-03 DIAGNOSIS — O24113 Pre-existing diabetes mellitus, type 2, in pregnancy, third trimester: Secondary | ICD-10-CM

## 2022-02-03 LAB — POCT URINALYSIS DIPSTICK OB
Blood, UA: NEGATIVE
Glucose, UA: NEGATIVE
Ketones, UA: NEGATIVE
Leukocytes, UA: NEGATIVE
Nitrite, UA: NEGATIVE
POC,PROTEIN,UA: NEGATIVE

## 2022-02-03 NOTE — Progress Notes (Signed)
HIGH-RISK PREGNANCY VISIT Patient name: Brianna [name] MRN [id number]  Date of birth: Feb 21, 1991 Chief Complaint:   High Risk Gestation ([place] & NST today)  History of Present Illness:   Brianna [name] is a 31 y.o. G63P0000 female at [redacted]w[redacted]d with an Estimated Date of Delivery: 03/17/22 being seen today for ongoing management of a high-risk pregnancy complicated by  hypertension and Class B Diabetes.    Today she reports no complaints. Contractions: Not present. Vag. Bleeding: None.  Movement: Present. denies leaking of fluid.      12/16/2021    3:23 PM 09/27/2021    1:11 PM 09/13/2021   10:29 AM 05/05/2021    8:28 AM 04/30/2021    9:15 AM  Depression screen PHQ 2/9  Decreased Interest 0 0 0 0 0  Down, Depressed, Hopeless 0 0 0 0 0  PHQ - 2 Score 0 0 0 0 0  Altered sleeping 1  1    Tired, decreased energy 1  1    Change in appetite 1  0    Feeling bad or failure about yourself  0  0    Trouble concentrating 0  0    Moving slowly or fidgety/restless 0  0    Suicidal thoughts 0  0    PHQ-9 Score 3  2          12/16/2021    3:24 PM 09/13/2021   10:30 AM 11/19/2020    9:12 AM  GAD 7 : Generalized Anxiety Score  Nervous, Anxious, on Edge 0 0 0  Control/stop worrying 0 0 0  Worry too much - different things 0 0 0  Trouble relaxing 0 0 0  Restless 0 0 0  Easily annoyed or irritable 0 1 0  Afraid - awful might happen 0 0 0  Total GAD 7 Score 0 1 0     Review of Systems:   Pertinent items are noted in HPI Denies abnormal vaginal discharge w/ itching/odor/irritation, headaches, visual changes, shortness of breath, chest pain, abdominal pain, severe nausea/vomiting, or problems with urination or bowel movements unless otherwise stated above. Pertinent History Reviewed:  Reviewed past medical,surgical, social, obstetrical and family history.  Reviewed problem list, medications and allergies. Physical Assessment:   Vitals:   02/03/22 1101  BP: (!) 142/87  Pulse: 90  Weight: 296 lb  (134.3 kg)  Body mass index is 49.26 kg/m.           Physical Examination:   General appearance: alert, well appearing, and in no distress  Mental status: alert, oriented to person, place, and time  Skin: warm & dry   Extremities: Edema: None    Cardiovascular: normal heart rate noted  Respiratory: normal respiratory effort, no distress  Abdomen: gravid, soft, non-tender  Pelvic: Cervical exam deferred         Fetal Status:     Movement: Present    Fetal Surveillance Testing today: BPP 6/8 with reactive NST=8/10 Dopplers 96% with good EDF(was 99% last week)   Chaperone: N/A    Results for orders placed or performed in visit on 02/03/22 (from the past 24 hour(s))  POC Urinalysis Dipstick OB   Collection Time: 02/03/22 11:15 AM  Result Value Ref Range   Color, UA     Clarity, UA     Glucose, UA Negative Negative   Bilirubin, UA     Ketones, UA neg    Spec Grav, UA     Blood, UA neg  pH, UA     POC,PROTEIN,UA Negative Negative, Trace, Small (1+), Moderate (2+), Large (3+), 4+   Urobilinogen, UA     Nitrite, UA neg    Leukocytes, UA Negative Negative   Appearance     Odor      Assessment & Plan:  High-risk pregnancy: G1P0000 at [redacted]w[redacted]d with an Estimated Date of Delivery: 03/17/22      ICD-10-CM   1. Supervision of high-risk pregnancy, third trimester  O09.93 POC Urinalysis Dipstick OB    2. Chronic hypertension affecting pregnancy  O10.919    on Labetalol 800 TID with reasonable control, <150s/95    3. Elevated Fetal Doppler flow studies, 96% 02/03/22  R93.89     4. [redacted] weeks gestation of pregnancy  Z3A.34 POC Urinalysis Dipstick OB    5. Class B/T2DM  O24.319    metformin 1000 mg BID, glyburide 2.5 mg qhs        Meds: No orders of the defined types were placed in this encounter.   Orders:  Orders Placed This Encounter  Procedures   POC Urinalysis Dipstick OB     Labs/procedures today: sonogram + NST  Treatment Plan:  continue twice weekly surveillance,  will IOL at 37 weeks or earlier if clinically indicated  Reviewed: Preterm labor symptoms and general obstetric precautions including but not limited to vaginal bleeding, contractions, leaking of fluid and fetal movement were reviewed in detail with the patient.  All questions were answered. Does have home bp cuff. Office bp cuff given: not applicable. Check bp daily, let us know if consistently >150 and/or >95.  Follow-up: No follow-ups on file.   Future Appointments  Date Time Provider Department Center  02/07/2022  9:50 AM [facility] NURSE [facility]  02/10/2022 10:00 AM [facility] [place] [facility] None  02/10/2022 10:50 AM Myna Hidalgo, DO [facility]  02/14/2022  9:50 AM [facility] NURSE [facility]  02/17/2022 10:00 AM [facility] [place] [facility] None  02/17/2022 10:50 AM Myna Hidalgo, DO [facility]  02/21/2022  9:50 AM [facility] NURSE [facility]  02/24/2022  9:45 AM [facility] [place] [facility] None  02/24/2022 10:30 AM Lazaro Arms, MD [facility]  02/28/2022 10:30 AM [facility] NURSE [facility]  03/03/2022  9:45 AM [facility] [place] [facility] None  03/03/2022 10:30 AM Myna Hidalgo, DO [facility]  03/07/2022 10:30 AM [facility] NURSE [facility]  03/10/2022  9:00 AM [facility] [place] [facility] None  03/10/2022  9:50 AM Cresenzo-Dishmon, Scarlette Calico, CNM [facility]    Orders Placed This Encounter  Procedures   POC Urinalysis Dipstick OB   Lazaro Arms  Attending Physician for the [facility] 02/03/2022 12:02 PM

## 2022-02-07 ENCOUNTER — Ambulatory Visit (INDEPENDENT_AMBULATORY_CARE_PROVIDER_SITE_OTHER): Payer: BC Managed Care – PPO | Admitting: *Deleted

## 2022-02-07 VITALS — BP 142/85 | HR 84 | Wt 301.0 lb

## 2022-02-07 DIAGNOSIS — O288 Other abnormal findings on antenatal screening of mother: Secondary | ICD-10-CM

## 2022-02-07 DIAGNOSIS — I10 Essential (primary) hypertension: Secondary | ICD-10-CM

## 2022-02-07 DIAGNOSIS — Z1389 Encounter for screening for other disorder: Secondary | ICD-10-CM

## 2022-02-07 DIAGNOSIS — O0993 Supervision of high risk pregnancy, unspecified, third trimester: Secondary | ICD-10-CM

## 2022-02-07 DIAGNOSIS — E119 Type 2 diabetes mellitus without complications: Secondary | ICD-10-CM

## 2022-02-07 DIAGNOSIS — Z331 Pregnant state, incidental: Secondary | ICD-10-CM

## 2022-02-07 LAB — POCT URINALYSIS DIPSTICK OB
Blood, UA: NEGATIVE
Glucose, UA: NEGATIVE
Ketones, UA: NEGATIVE
Leukocytes, UA: NEGATIVE
Nitrite, UA: NEGATIVE
POC,PROTEIN,UA: NEGATIVE

## 2022-02-07 NOTE — Progress Notes (Signed)
   NURSE VISIT- NST  SUBJECTIVE:  Brianna [name] is a 31 y.o. G1P0000 female at [redacted]w[redacted]d, here for a NST for pregnancy complicated by [facility] and A2/BDM currently on glyburide and metformin .  She reports active fetal movement, contractions: none, vaginal bleeding: none, membranes: intact.   OBJECTIVE:  BP (!) 142/85   Pulse 84   Wt (!) 301 lb (136.5 kg)   LMP 06/10/2021   BMI 50.09 kg/m   Appears well, no apparent distress  Results for orders placed or performed in visit on 02/07/22 (from the past 24 hour(s))  POC Urinalysis Dipstick OB   Collection Time: 02/07/22 10:24 AM  Result Value Ref Range   Color, UA     Clarity, UA     Glucose, UA Negative Negative   Bilirubin, UA     Ketones, UA neg    Spec Grav, UA     Blood, UA neg    pH, UA     POC,PROTEIN,UA Negative Negative, Trace, Small (1+), Moderate (2+), Large (3+), 4+   Urobilinogen, UA     Nitrite, UA neg    Leukocytes, UA Negative Negative   Appearance     Odor      NST: FHR baseline 140 bpm, Variability: moderate, Accelerations:present, Decelerations:  Absent= Cat 1/reactive Toco: none   ASSESSMENT: G1P0000 at [redacted]w[redacted]d with CHTN and A2/BDM currently on metformin and glyburide NST reactive  PLAN: EFM strip reviewed by Dr. Charlotta Newton   Recommendations: keep next appointment as scheduled    Annamarie Dawley  02/07/2022 1:13 PM

## 2022-02-08 ENCOUNTER — Encounter: Payer: Self-pay | Admitting: Obstetrics & Gynecology

## 2022-02-09 ENCOUNTER — Other Ambulatory Visit: Payer: Self-pay | Admitting: Obstetrics & Gynecology

## 2022-02-09 DIAGNOSIS — O24113 Pre-existing diabetes mellitus, type 2, in pregnancy, third trimester: Secondary | ICD-10-CM

## 2022-02-09 DIAGNOSIS — O10919 Unspecified pre-existing hypertension complicating pregnancy, unspecified trimester: Secondary | ICD-10-CM

## 2022-02-10 ENCOUNTER — Ambulatory Visit (INDEPENDENT_AMBULATORY_CARE_PROVIDER_SITE_OTHER): Payer: BC Managed Care – PPO

## 2022-02-10 ENCOUNTER — Ambulatory Visit (INDEPENDENT_AMBULATORY_CARE_PROVIDER_SITE_OTHER): Payer: BC Managed Care – PPO | Admitting: Obstetrics & Gynecology

## 2022-02-10 ENCOUNTER — Other Ambulatory Visit: Payer: Self-pay | Admitting: Obstetrics & Gynecology

## 2022-02-10 ENCOUNTER — Other Ambulatory Visit (HOSPITAL_COMMUNITY)
Admission: RE | Admit: 2022-02-10 | Discharge: 2022-02-10 | Disposition: A | Discharge status: Home / Self Care | Payer: BC Managed Care – PPO | Source: Ambulatory Visit | Attending: Obstetrics & Gynecology | Admitting: Obstetrics & Gynecology

## 2022-02-10 VITALS — BP 163/90 | HR 89 | Wt 300.4 lb

## 2022-02-10 DIAGNOSIS — O0990 Supervision of high risk pregnancy, unspecified, unspecified trimester: Secondary | ICD-10-CM | POA: Diagnosis not present

## 2022-02-10 DIAGNOSIS — E119 Type 2 diabetes mellitus without complications: Secondary | ICD-10-CM

## 2022-02-10 DIAGNOSIS — Z3A35 35 weeks gestation of pregnancy: Secondary | ICD-10-CM

## 2022-02-10 DIAGNOSIS — O10919 Unspecified pre-existing hypertension complicating pregnancy, unspecified trimester: Secondary | ICD-10-CM

## 2022-02-10 DIAGNOSIS — I10 Essential (primary) hypertension: Secondary | ICD-10-CM

## 2022-02-10 DIAGNOSIS — O0993 Supervision of high risk pregnancy, unspecified, third trimester: Secondary | ICD-10-CM | POA: Insufficient documentation

## 2022-02-10 DIAGNOSIS — O24113 Pre-existing diabetes mellitus, type 2, in pregnancy, third trimester: Secondary | ICD-10-CM

## 2022-02-10 LAB — POCT URINALYSIS DIPSTICK OB
Blood, UA: NEGATIVE
Ketones, UA: NEGATIVE
Leukocytes, UA: NEGATIVE
Nitrite, UA: NEGATIVE

## 2022-02-10 MED ORDER — NIFEDIPINE ER OSMOTIC RELEASE 30 MG PO TB24: 30.0000 mg | ORAL_TABLET | Freq: Every day | ORAL | 0 refills | Status: DC

## 2022-02-10 NOTE — Progress Notes (Signed)
HIGH-RISK PREGNANCY VISIT Patient name: Sophronia Varney MRN [id number]  Date of birth: 17-Apr-1991 Chief Complaint:   Routine Prenatal Visit  History of Present Illness:   Ragena Fiola is a 31 y.o. G17P0000 female at 61w0dwith an Estimated Date of Delivery: 03/17/22 being seen today for ongoing management of a high-risk pregnancy complicated by:  -cHTN- Labetalol 8042mtid BPs at home 130-150/80-90s -Class BDM- on glyburide and metformin -Anemia -Bilateral RPD -Hypothyroidism -Morbid obesity  Today she reports no complaints. A few days ago noted blurry vision for a few hours, but that has since resolved.  Contractions: Not present. Vag. Bleeding: None.  Movement: Present. denies leaking of fluid.      12/16/2021    3:23 PM 09/27/2021    1:11 PM 09/13/2021   10:29 AM 05/05/2021    8:28 AM 04/30/2021    9:15 AM  Depression screen PHQ 2/9  Decreased Interest 0 0 0 0 0  Down, Depressed, Hopeless 0 0 0 0 0  PHQ - 2 Score 0 0 0 0 0  Altered sleeping 1  1    Tired, decreased energy 1  1    Change in appetite 1  0    Feeling bad or failure about yourself  0  0    Trouble concentrating 0  0    Moving slowly or fidgety/restless 0  0    Suicidal thoughts 0  0    PHQ-9 Score 3  2       Current Outpatient Medications  Medication Instructions   aspirin EC 162 mg, Oral, Daily, Swallow whole.   blood glucose meter kit and supplies KIT Dispense based on patient and insurance preference. Test blood sugar once daily. Diabetes type 2. E11.9   ferrous sulfate 325 mg, Oral, Every other day   glyBURIDE (DIABETA) 2.5 MG tablet TAKE 1 TABLET BY MOUTH AT BEDTIME.   labetalol (NORMODYNE) 800 mg, Oral, 3 times daily   Lancets (FREESTYLE) lancets Use as instructed   levothyroxine (SYNTHROID) 25 MCG tablet TAKE 1 TABLET BY MOUTH EVERY DAY BEFORE BREAKFAST   metFORMIN (GLUCOPHAGE) 1000 MG tablet TAKE 1 TABLET (1,000 MG TOTAL) BY MOUTH TWICE A DAY WITH FOOD   NIFEdipine (PROCARDIA XL) 30 mg, Oral,  Daily   OVER THE COUNTER MEDICATION Vit d<BR>Vit c <BR>elderberry    Prenatal Vit-Fe Fumarate-FA (PRENATAL VITAMIN PO) Oral     Review of Systems:   Pertinent items are noted in HPI Denies abnormal vaginal discharge w/ itching/odor/irritation, headaches, visual changes, shortness of breath, chest pain, abdominal pain, severe nausea/vomiting, or problems with urination or bowel movements unless otherwise stated above. Pertinent History Reviewed:  Reviewed past medical,surgical, social, obstetrical and family history.  Reviewed problem list, medications and allergies. Physical Assessment:   Vitals:   02/10/22 1037 02/10/22 1042  BP: (!) 172/87 (!) 163/90  Pulse: 91 89  Weight: (!) 300 lb 6.4 oz (136.3 kg)   Body mass index is 49.99 kg/m.           Physical Examination:   General appearance: alert, well appearing, and in no distress  Mental status: normal mood, behavior, speech, dress, motor activity, and thought processes  Skin: warm & dry   Extremities: Edema: None    Cardiovascular: normal heart rate noted  Respiratory: normal respiratory effort, no distress  Abdomen: gravid, obese, soft, non-tender  Pelvic: Cervical exam performed  Dilation: Closed Effacement (%): Thick Station: -3 vaginal swabs obtained  Fetal Status:     Movement: Present  Fetal Surveillance Testing today: BPP 8/8,fhr 144 BPM,AFI 14.5 cm,posterior placenta gr 3,left renal pelvis 4.6 mm (WNL),right renal pelvic dilation 9.5 mm,elevated UAD with EDF,RI .88,.80,.74,.79=99%   Chaperone: Amanda Andrews    Results for orders placed or performed in visit on 02/10/22 (from the past 24 hour(s))  POC Urinalysis Dipstick OB   Collection Time: 02/10/22 10:40 AM  Result Value Ref Range   Color, UA     Clarity, UA     Glucose, UA Small (1+) (A) Negative   Bilirubin, UA     Ketones, UA neg    Spec Grav, UA     Blood, UA neg    pH, UA     POC,PROTEIN,UA Trace Negative, Trace, Small (1+), Moderate (2+), Large  (3+), 4+   Urobilinogen, UA     Nitrite, UA neg    Leukocytes, UA Negative Negative   Appearance     Odor       Assessment & Plan:  High-risk pregnancy: G1P0000 at [redacted]w[redacted]d with an Estimated Date of Delivery: 03/17/22   1) Chronic HTN -BP elevated today.  Currently asymptomatic, UA negative.  No evidence of superimposed preeclampsia -lab work today -add Procardia XL 30mg daily -reviewed preeclampsia precautions- strongly encouraged to go to MAU if BPs elevated or any acute changes/symptoms -Elevated dopplers noted, continue twice weekly testing -[] plan for IOL @ 37wk  2) Class BDM -sugars controlled with current medication    Meds:  Meds ordered this encounter  Medications   NIFEdipine (PROCARDIA XL) 30 MG 24 hr tablet    Sig: Take 1 tablet (30 mg total) by mouth daily.    Dispense:  30 tablet    Refill:  0    Labs/procedures today: BPP/doppler, GBS, GC/C obtained  Treatment Plan:  as outlined above  Reviewed: Preterm labor symptoms and general obstetric precautions including but not limited to vaginal bleeding, contractions, leaking of fluid and fetal movement were reviewed in detail with the patient.  All questions were answered. Pt has home bp cuff. Check bp weekly, let us know if >160/100  Follow-up: No follow-ups on file.   Future Appointments  Date Time Provider Department Center  02/14/2022  9:50 AM [facility] NURSE [facility]  02/17/2022 10:00 AM [facility] [place] [facility] None  02/17/2022 10:50 AM Ozan, Jennifer, DO [facility]  02/21/2022  9:50 AM [facility] NURSE [facility]  02/24/2022  9:45 AM [facility] [place] [facility] None  02/24/2022 10:30 AM Eure, Luther H, MD [facility]  02/28/2022 10:30 AM [facility] NURSE [facility]  03/03/2022  9:45 AM [facility] US [facility] None  03/03/2022 10:30 AM Ozan, Jennifer, DO [facility]  03/07/2022 10:30 AM [facility] NURSE [facility]  03/10/2022  8:30 AM [facility] US [facility] None  03/10/2022   9:30 AM Cresenzo-Dishmon, Frances, CNM [facility]    Orders Placed This Encounter  Procedures   Culture, beta strep (group b only)   Comprehensive metabolic panel   CBC   Protein / creatinine ratio, urine   POC Urinalysis Dipstick OB    Jennifer Ozan, DO Attending Obstetrician & Gynecologist, [facility], [facility]    

## 2022-02-10 NOTE — Progress Notes (Signed)
[place] 35 wks,cephalic,BPP 8/8,fhr 144 BPM,AFI 14.5 cm,posterior placenta gr 3,left renal pelvis 4.6 mm (WNL),right renal pelvic dilation 9.5 mm,elevated UAD with EDF,RI .88,.80,.74,.79=99%

## 2022-02-11 LAB — COMPREHENSIVE METABOLIC PANEL
ALT: 16 IU/L (ref 0–32)
AST: 13 IU/L (ref 0–40)
Albumin/Globulin Ratio: 1.2 (ref 1.2–2.2)
Albumin: 3.4 g/dL — ABNORMAL LOW (ref 4.0–5.0)
Alkaline Phosphatase: 141 IU/L — ABNORMAL HIGH (ref 44–121)
BUN/Creatinine Ratio: 13 (ref 9–23)
BUN: 6 mg/dL (ref 6–20)
Bilirubin Total: <0.2 mg/dL (ref 0.0–1.2)
CO2: 21 mmol/L (ref 20–29)
Calcium: 9.4 mg/dL (ref 8.7–10.2)
Chloride: 105 mmol/L (ref 96–106)
Creatinine, Ser: 0.47 mg/dL — ABNORMAL LOW (ref 0.57–1.00)
Globulin, Total: 2.9 g/dL (ref 1.5–4.5)
Glucose: 109 mg/dL — ABNORMAL HIGH (ref 70–99)
Potassium: 4 mmol/L (ref 3.5–5.2)
Sodium: 136 mmol/L (ref 134–144)
Total Protein: 6.3 g/dL (ref 6.0–8.5)
eGFR: 131 mL/min/{1.73_m2} (ref >59)

## 2022-02-11 LAB — CERVICOVAGINAL ANCILLARY ONLY
Chlamydia: NEGATIVE
Comment: NEGATIVE
Comment: NORMAL
Neisseria Gonorrhea: NEGATIVE

## 2022-02-11 LAB — CBC
Hematocrit: 26.9 % — ABNORMAL LOW (ref 34.0–46.6)
Hemoglobin: 8.9 g/dL — ABNORMAL LOW (ref 11.1–15.9)
MCH: 28.8 pg (ref 26.6–33.0)
MCHC: 33.1 g/dL (ref 31.5–35.7)
MCV: 87 fL (ref 79–97)
Platelets: 204 10*3/uL (ref 150–450)
RBC: 3.09 x10E6/uL — ABNORMAL LOW (ref 3.77–5.28)
RDW: 13.1 % (ref 11.7–15.4)
WBC: 5.2 10*3/uL (ref 3.4–10.8)

## 2022-02-11 LAB — PROTEIN / CREATININE RATIO, URINE
Creatinine, Urine: 170.4 mg/dL
Protein, Ur: 33.2 mg/dL
Protein/Creat Ratio: 195 mg/g creat (ref 0–200)

## 2022-02-13 LAB — CULTURE, BETA STREP (GROUP B ONLY): Strep Gp B Culture: POSITIVE — AB

## 2022-02-14 ENCOUNTER — Inpatient Hospital Stay (HOSPITAL_COMMUNITY)
Admission: AD | Admit: 2022-02-14 | Discharge: 2022-02-15 | Disposition: A | Discharge status: Home / Self Care | Payer: BC Managed Care – PPO | Attending: Obstetrics and Gynecology | Admitting: Obstetrics and Gynecology

## 2022-02-14 ENCOUNTER — Ambulatory Visit (INDEPENDENT_AMBULATORY_CARE_PROVIDER_SITE_OTHER): Payer: BC Managed Care – PPO | Admitting: Women's Health

## 2022-02-14 ENCOUNTER — Ambulatory Visit (INDEPENDENT_AMBULATORY_CARE_PROVIDER_SITE_OTHER): Payer: BC Managed Care – PPO

## 2022-02-14 ENCOUNTER — Encounter (HOSPITAL_COMMUNITY): Payer: Self-pay | Admitting: Obstetrics and Gynecology

## 2022-02-14 ENCOUNTER — Encounter (INDEPENDENT_AMBULATORY_CARE_PROVIDER_SITE_OTHER): Payer: BC Managed Care – PPO

## 2022-02-14 ENCOUNTER — Encounter: Payer: Self-pay | Admitting: Women's Health

## 2022-02-14 ENCOUNTER — Inpatient Hospital Stay (HOSPITAL_COMMUNITY): Payer: BC Managed Care – PPO

## 2022-02-14 ENCOUNTER — Other Ambulatory Visit: Payer: Self-pay | Admitting: Women's Health

## 2022-02-14 VITALS — BP 163/95

## 2022-02-14 VITALS — BP 150/80 | HR 99 | Wt 297.0 lb

## 2022-02-14 DIAGNOSIS — O368330 Maternal care for abnormalities of the fetal heart rate or rhythm, third trimester, not applicable or unspecified: Secondary | ICD-10-CM

## 2022-02-14 DIAGNOSIS — O10919 Unspecified pre-existing hypertension complicating pregnancy, unspecified trimester: Secondary | ICD-10-CM

## 2022-02-14 DIAGNOSIS — I10 Essential (primary) hypertension: Secondary | ICD-10-CM

## 2022-02-14 DIAGNOSIS — Z3A35 35 weeks gestation of pregnancy: Secondary | ICD-10-CM

## 2022-02-14 DIAGNOSIS — O288 Other abnormal findings on antenatal screening of mother: Secondary | ICD-10-CM

## 2022-02-14 DIAGNOSIS — Z79899 Other long term (current) drug therapy: Secondary | ICD-10-CM | POA: Diagnosis not present

## 2022-02-14 DIAGNOSIS — O0993 Supervision of high risk pregnancy, unspecified, third trimester: Secondary | ICD-10-CM

## 2022-02-14 DIAGNOSIS — E119 Type 2 diabetes mellitus without complications: Secondary | ICD-10-CM

## 2022-02-14 DIAGNOSIS — O10913 Unspecified pre-existing hypertension complicating pregnancy, third trimester: Secondary | ICD-10-CM | POA: Diagnosis not present

## 2022-02-14 LAB — COMPREHENSIVE METABOLIC PANEL
ALT: 19 U/L (ref 0–44)
AST: 17 U/L (ref 15–41)
Albumin: 2.7 g/dL — ABNORMAL LOW (ref 3.5–5.0)
Alkaline Phosphatase: 143 U/L — ABNORMAL HIGH (ref 38–126)
Anion gap: 6 (ref 5–15)
BUN: 5 mg/dL — ABNORMAL LOW (ref 6–20)
CO2: 21 mmol/L — ABNORMAL LOW (ref 22–32)
Calcium: 8.9 mg/dL (ref 8.9–10.3)
Chloride: 110 mmol/L (ref 98–111)
Creatinine, Ser: 0.45 mg/dL (ref 0.44–1.00)
GFR, Estimated: >60 mL/min (ref >60)
Glucose, Bld: 84 mg/dL (ref 70–99)
Potassium: 3.8 mmol/L (ref 3.5–5.1)
Sodium: 137 mmol/L (ref 135–145)
Total Bilirubin: 0.2 mg/dL — ABNORMAL LOW (ref 0.3–1.2)
Total Protein: 6.4 g/dL — ABNORMAL LOW (ref 6.5–8.1)

## 2022-02-14 LAB — POCT URINALYSIS DIPSTICK OB
Blood, UA: NEGATIVE
Glucose, UA: NEGATIVE
Ketones, UA: NEGATIVE
Leukocytes, UA: NEGATIVE
Nitrite, UA: NEGATIVE
POC,PROTEIN,UA: NEGATIVE

## 2022-02-14 LAB — URINALYSIS, ROUTINE W REFLEX MICROSCOPIC
Bilirubin Urine: NEGATIVE
Glucose, UA: NEGATIVE mg/dL
Hgb urine dipstick: NEGATIVE
Ketones, ur: NEGATIVE mg/dL
Leukocytes,Ua: NEGATIVE
Nitrite: NEGATIVE
Protein, ur: NEGATIVE mg/dL
Specific Gravity, Urine: 1.012 (ref 1.005–1.030)
pH: 6 (ref 5.0–8.0)

## 2022-02-14 LAB — CBC
HCT: 28.8 % — ABNORMAL LOW (ref 36.0–46.0)
Hemoglobin: 9.4 g/dL — ABNORMAL LOW (ref 12.0–15.0)
MCH: 28.6 pg (ref 26.0–34.0)
MCHC: 32.6 g/dL (ref 30.0–36.0)
MCV: 87.5 fL (ref 80.0–100.0)
Platelets: 221 10*3/uL (ref 150–400)
RBC: 3.29 MIL/uL — ABNORMAL LOW (ref 3.87–5.11)
RDW: 13.6 % (ref 11.5–15.5)
WBC: 6.1 10*3/uL (ref 4.0–10.5)
nRBC: 0 % (ref 0.0–0.2)

## 2022-02-14 LAB — PROTEIN / CREATININE RATIO, URINE
Creatinine, Urine: 55 mg/dL
Protein Creatinine Ratio: 0.16 mg/mg{Cre} — ABNORMAL HIGH (ref 0.00–0.15)
Total Protein, Urine: 9 mg/dL

## 2022-02-14 MED ORDER — NIFEDIPINE ER OSMOTIC RELEASE 30 MG PO TB24
30.0000 mg | ORAL_TABLET | Freq: Once | ORAL | Status: AC
  Administered 2022-02-14: 30 mg via ORAL
  Filled 2022-02-14: qty 1

## 2022-02-14 MED ORDER — LABETALOL HCL 100 MG PO TABS
800.0000 mg | ORAL_TABLET | Freq: Once | ORAL | Status: AC
Start: 2022-02-14 — End: 2022-02-14
  Administered 2022-02-14: 800 mg via ORAL
  Filled 2022-02-14: qty 8

## 2022-02-14 NOTE — Progress Notes (Signed)
[place] 35+4 wks,cephalic,BPP 6/8 no breathing,FHR 159 bpm,right renal pelvis dilatation 9 mm,afi 12.7 cm,RI 1.0,.73,,.72=97%,intermittent absent EDF

## 2022-02-14 NOTE — MAU Note (Signed)
Brianna [name] is a 31 y.o. at [redacted]w[redacted]d here in MAU reporting: patient had elevated blood pressures in the OB office today and a nonreactive FHR tracing. Patient denies having any LOF, vaginal bleeding or contractions. Patient does feel fetal movement.  Onset of complaint: today Pain score: 0 (denies having any pain) Vitals:   02/14/22 1433  Temp: 98.5 F (36.9 C)     FHT:150 Lab orders placed from triage:  urinalysis

## 2022-02-14 NOTE — MAU Provider Note (Signed)
History     CSN: [id number]  Arrival date and time: 02/14/22 1411   Event Date/Time   First Provider Initiated Contact with Patient 02/14/22 1501      Chief Complaint  Patient presents with   Hypertension    Nonreactive FHR tracing in OB office    Brianna [name], a  31 y.o. G1P0000 at 30w4dpresents to MAU following a visit from the office. Patient was seen for a RN visit with NST. Elevated BPs in the office with a reassuring, but non-reactive NST in the today. Episodic AEDF and BPP today 6/10 off for breathing. Patient currently on Labetalol 8035mTID with Procardia XL 30 started 4 days ago. Patient denies headache, blurred vision, epigastric pain, vaginal bleeding, leaking of fluids, and contractions. She endorses positive fetal movement.   Severe range BP noted on arrival to MAU.           OB History     Gravida  1   Para  0   Term  0   Preterm  0   AB  0   Living  0      SAB  0   IAB  0   Ectopic  0   Multiple  0   Live Births  0           Past Medical History:  Diagnosis Date   Diabetes mellitus without complication ([facility]   Hypertension    Obesity    Pain    back pain, dx. herniated nucleus polpusos   Thyroid disease     Past Surgical History:  Procedure Laterality Date   LUMBAR LAMINECTOMY/DECOMPRESSION MICRODISCECTOMY N/A 09/03/2014   Procedure: MICROLUMBAR DECOMPRESSION LUMBAR FOUR TO FIVE, LUMBAR FIVE TO SACRAL ONE;  Surgeon: JeJohnn HaiMD;  Location: [facility];  Service: Orthopedics;  Laterality: N/A;   WISDOM TOOTH EXTRACTION      Family History  Problem Relation Age of Onset   Diabetes Mother    Hypertension Father    Hypertension Brother    Diabetes Maternal Grandmother    Hypertension Maternal Grandmother    Cancer Maternal Grandmother    Diabetes Maternal Grandfather    Hypertension Maternal Grandfather    Diabetes Paternal Grandmother    Hypertension Paternal Grandmother    Colon cancer Paternal Grandmother     Diabetes Paternal Grandfather    Hypertension Paternal Grandfather     Social History   Tobacco Use   Smoking status: Never   Smokeless tobacco: Never  Vaping Use   Vaping Use: Never used  Substance Use Topics   Alcohol use: Not Currently   Drug use: No    Allergies: No Known Allergies  Medications Prior to Admission  Medication Sig Dispense Refill Last Dose   aspirin 81 MG EC tablet Take 2 tablets (162 mg total) by mouth daily. Swallow whole. 180 tablet 2 02/14/2022   ferrous sulfate 325 (65 FE) MG tablet Take 1 tablet (325 mg total) by mouth every other day. 45 tablet 2 02/14/2022   glyBURIDE (DIABETA) 2.5 MG tablet TAKE 1 TABLET BY MOUTH AT BEDTIME. 90 tablet 1 02/13/2022   labetalol (NORMODYNE) 200 MG tablet Take 4 tablets (800 mg total) by mouth 3 (three) times daily. 360 tablet 6 02/14/2022   levothyroxine (SYNTHROID) 25 MCG tablet TAKE 1 TABLET BY MOUTH EVERY DAY BEFORE BREAKFAST 90 tablet 0 02/14/2022   metFORMIN (GLUCOPHAGE) 1000 MG tablet TAKE 1 TABLET (1,000 MG TOTAL) BY MOUTH TWICE A DAY WITH FOOD  180 tablet 3 02/14/2022   NIFEdipine (PROCARDIA XL) 30 MG 24 hr tablet Take 1 tablet (30 mg total) by mouth daily. 30 tablet 0 02/14/2022   OVER THE COUNTER MEDICATION Vit d Vit c  elderberry   02/13/2022   Prenatal Vit-Fe Fumarate-FA (PRENATAL VITAMIN PO) Take by mouth.   02/13/2022   blood glucose meter kit and supplies KIT Dispense based on patient and insurance preference. Test blood sugar once daily. Diabetes type 2. E11.9 1 each 5    Lancets (FREESTYLE) lancets Use as instructed 100 each 5     Review of Systems  Constitutional:  Negative for chills and fatigue.  Respiratory:  Negative for apnea, shortness of breath and wheezing.   Cardiovascular:  Positive for leg swelling. Negative for chest pain.  Gastrointestinal:  Negative for constipation, diarrhea, nausea and vomiting.  Genitourinary:  Negative for difficulty urinating, vaginal bleeding, vaginal discharge and vaginal  pain.  Musculoskeletal:  Negative for back pain.  Neurological:  Negative for dizziness, seizures, weakness, light-headedness and headaches.   Physical Exam   Blood pressure 119/64, pulse 99, temperature 98.5 F (36.9 C), temperature source Oral, resp. rate 18, last menstrual period 06/10/2021, SpO2 99 %.  Physical Exam Vitals and nursing note reviewed.  HENT:     Head: Normocephalic.  Cardiovascular:     Rate and Rhythm: Normal rate and regular rhythm.  Pulmonary:     Effort: Pulmonary effort is normal.     Breath sounds: Normal breath sounds.  Abdominal:     Palpations: Abdomen is soft.     Tenderness: There is no abdominal tenderness. There is no guarding.  Musculoskeletal:        General: Normal range of motion.     Cervical back: Normal range of motion.  Skin:    General: Skin is warm and dry.  Neurological:     Mental Status: She is alert and oriented to person, place, and time.  Psychiatric:        Mood and Affect: Mood normal.    - 14:55pm Call placed to Dr. Dione Plover. Notified of patient arrival and plan of care. Per MD, increase procardia to 48m and continue to cycle BPs. Afternoon dose of Labetalol due, given 1 hour early. Pre E labs pending.   FHT: 145bpm with min/moderate variability  no accels present. Small variables noted with quick return to baseline.  Toco:  Quiet  MAU Course  Procedures Orders Placed This Encounter  Procedures   [place] FETAL BPP WO NON STRESS   [place] UA CORD DOPPLER   Urinalysis, Routine w reflex microscopic Urine, Clean Catch   CBC   Comprehensive metabolic panel   Protein / creatinine ratio, urine   Results for orders placed or performed during the hospital encounter of 02/14/22 (from the past 24 hour(s))  Urinalysis, Routine w reflex microscopic Urine, Clean Catch     Status: None   Collection Time: 02/14/22  2:48 PM  Result Value Ref Range   Color, Urine YELLOW YELLOW   APPearance CLEAR CLEAR   Specific Gravity, Urine 1.012  1.005 - 1.030   pH 6.0 5.0 - 8.0   Glucose, UA NEGATIVE NEGATIVE mg/dL   Hgb urine dipstick NEGATIVE NEGATIVE   Bilirubin Urine NEGATIVE NEGATIVE   Ketones, ur NEGATIVE NEGATIVE mg/dL   Protein, ur NEGATIVE NEGATIVE mg/dL   Nitrite NEGATIVE NEGATIVE   Leukocytes,Ua NEGATIVE NEGATIVE  Protein / creatinine ratio, urine     Status: Abnormal   Collection Time: 02/14/22  2:52 PM  Result Value Ref Range   Creatinine, Urine 55 mg/dL   Total Protein, Urine 9 mg/dL   Protein Creatinine Ratio 0.16 (H) 0.00 - 0.15 mg/mg[Cre]  CBC     Status: Abnormal   Collection Time: 02/14/22  3:03 PM  Result Value Ref Range   WBC 6.1 4.0 - 10.5 K/uL   RBC 3.29 (L) 3.87 - 5.11 MIL/uL   Hemoglobin 9.4 (L) 12.0 - 15.0 g/dL   HCT 28.8 (L) 36.0 - 46.0 %   MCV 87.5 80.0 - 100.0 fL   MCH 28.6 26.0 - 34.0 pg   MCHC 32.6 30.0 - 36.0 g/dL   RDW 13.6 11.5 - 15.5 %   Platelets 221 150 - 400 K/uL   nRBC 0.0 0.0 - 0.2 %  Comprehensive metabolic panel     Status: Abnormal   Collection Time: 02/14/22  3:03 PM  Result Value Ref Range   Sodium 137 135 - 145 mmol/L   Potassium 3.8 3.5 - 5.1 mmol/L   Chloride 110 98 - 111 mmol/L   CO2 21 (L) 22 - 32 mmol/L   Glucose, Bld 84 70 - 99 mg/dL   BUN 5 (L) 6 - 20 mg/dL   Creatinine, Ser 0.45 0.44 - 1.00 mg/dL   Calcium 8.9 8.9 - 10.3 mg/dL   Total Protein 6.4 (L) 6.5 - 8.1 g/dL   Albumin 2.7 (L) 3.5 - 5.0 g/dL   AST 17 15 - 41 U/L   ALT 19 0 - 44 U/L   Alkaline Phosphatase 143 (H) 38 - 126 U/L   Total Bilirubin 0.2 (L) 0.3 - 1.2 mg/dL   GFR, Estimated >60 >60 mL/min   Anion gap 6 5 - 15   Patient Vitals for the past 24 hrs:  BP Temp Temp src Pulse Resp SpO2  02/14/22 1749 -- -- -- -- -- 99 %  02/14/22 1746 119/64 -- -- 99 -- --  02/14/22 1731 134/70 -- -- 97 -- --  02/14/22 1719 -- -- -- -- -- 99 %  02/14/22 1717 121/62 -- -- 98 -- --  02/14/22 1700 126/78 -- -- (!) 102 -- --  02/14/22 1649 -- -- -- -- -- 99 %  02/14/22 1646 115/68 -- -- (!) 104 -- --   02/14/22 1630 126/69 -- -- 97 -- --  02/14/22 1629 -- -- -- -- -- 98 %  02/14/22 1616 112/73 -- -- 99 -- --  02/14/22 1614 -- -- -- -- -- 98 %  02/14/22 1609 -- -- -- -- -- 98 %  02/14/22 1601 120/70 -- -- (!) 104 -- --  02/14/22 1546 (!) 150/71 -- -- 99 -- --  02/14/22 1544 -- -- -- -- -- 98 %  02/14/22 1531 (!) 149/82 -- -- (!) 102 -- --  02/14/22 1524 -- -- -- -- -- 99 %  02/14/22 1519 -- -- -- -- -- 99 %  02/14/22 1516 (!) 156/80 -- -- (!) 101 -- --  02/14/22 1514 -- -- -- -- -- 99 %  02/14/22 1501 (!) 163/92 -- -- 99 -- --  02/14/22 1500 -- -- -- -- -- 99 %  02/14/22 1454 -- -- -- -- -- 99 %  02/14/22 1447 (!) 167/90 -- -- 98 18 98 %  02/14/22 1436 (!) 153/91 -- -- 99 20 99 %  02/14/22 1433 -- 98.5 F (36.9 C) Oral -- -- --   Preliminary results: 1 fetus in Cephalic presentation. AFI WNL. 4/8 PP off for  breathing and fetal movement. Bilateral pyelectasis. Intermittent AEDF. Dopplers >97.5%.    MDM - UA normal  - Hbg improved from 8.9 to 9.4.  - BP's improved with PO Procardia and Labetalol. New regimen 833m TID with 681mof PO Procardia daily.  - PCR 0.16  - Low suspicion for PreE at this time. Continue to monitor.  - BPP Results: 4/8 with intermittent AEDF.   - Consulted Dr. CoElly Modenaor patient admission with induction at 35 weeks. D/t NICU acuity being in the RED possible transfer of care.     - FHT: 145/150 min/moderate variability. No overt decels.  Toco: Quiet.   Assessment and Plan  - D/t NICU acuity, plan to transfer patient another facility.   - CNM informed patient of plan of care.   ShJacquiline DoeMSN, CNM  02/14/2022, 7:47 PM

## 2022-02-14 NOTE — Progress Notes (Signed)
HIGH-RISK PREGNANCY VISIT Patient name: Brianna [name] MRN [id number]  Date of birth: [birth year] Chief Complaint:   Non-stress Test  History of Present Illness:   Brianna [name] is a 31 y.o. G25P0000 female at [redacted]w[redacted]d with an Estimated Date of Delivery: 03/17/22 being seen today for ongoing management of a high-risk pregnancy complicated by chronic hypertension currently on labetalol 800mg  TID and nifedipine 30mg  daily and diabetes mellitus T2DM on metformin 1000mg  BID and glyburide 2.5mg  qhs.   She was initially a nurse visit for NST, however NST was non-reactive. So BPP ordered and added to my schedule. She reports no complaints. Denies ha, visual changes, ruq/epigastric pain, n/v.  Good fetal movement, no contractions, no vb/lof.  Took blood pressure medicines this morning.      12/16/2021    3:23 PM 09/27/2021    1:11 PM 09/13/2021   10:29 AM 05/05/2021    8:28 AM 04/30/2021    9:15 AM  Depression screen PHQ 2/9  Decreased Interest 0 0 0 0 0  Down, Depressed, Hopeless 0 0 0 0 0  PHQ - 2 Score 0 0 0 0 0  Altered sleeping 1  1    Tired, decreased energy 1  1    Change in appetite 1  0    Feeling bad or failure about yourself  0  0    Trouble concentrating 0  0    Moving slowly or fidgety/restless 0  0    Suicidal thoughts 0  0    PHQ-9 Score 3  2          12/16/2021    3:24 PM 09/13/2021   10:30 AM 11/19/2020    9:12 AM  GAD 7 : Generalized Anxiety Score  Nervous, Anxious, on Edge 0 0 0  Control/stop worrying 0 0 0  Worry too much - different things 0 0 0  Trouble relaxing 0 0 0  Restless 0 0 0  Easily annoyed or irritable 0 1 0  Afraid - awful might happen 0 0 0  Total GAD 7 Score 0 1 0     Review of Systems:   Pertinent items are noted in HPI Denies abnormal vaginal discharge w/ itching/odor/irritation, headaches, visual changes, shortness of breath, chest pain, abdominal pain, severe nausea/vomiting, or problems with urination or bowel movements unless otherwise stated  above. Pertinent History Reviewed:  Reviewed past medical,surgical, social, obstetrical and family history.  Reviewed problem list, medications and allergies. Physical Assessment:  Pulse 99, BP 157/98, repeat 163/95, repeat 150/80 Weight 297lb           Physical Examination:   General appearance: alert, well appearing, and in no distress  Mental status: alert, oriented to person, place, and time  Skin: warm & dry   Extremities:      Cardiovascular: normal heart rate noted  Respiratory: normal respiratory effort, no distress  Abdomen: gravid, soft, non-tender  Pelvic: Cervical exam deferred         Fetal Status: NST: FHR baseline 140 bpm, Variability: moderate, Accelerations:present, Decelerations:  Absent= Cat 1/non-reactive and 10x10s (no 15x15s) Toco: none   Worked in for BPP/Dopp: 12/18/2021 35+4 wks,cephalic,BPP 6/8 no breathing,FHR 159 bpm,right renal pelvis dilatation 9 mm,afi 12.7 cm,RI 1.0,.73,,.72,.76=97%,intermittent absent EDF  Chaperone: N/A    Results for orders placed or performed in visit on 02/14/22 (from the past 24 hour(s))  POC Urinalysis Dipstick OB   Collection Time: 02/14/22  1:30 PM  Result Value Ref Range   Color, UA  Clarity, UA     Glucose, UA Negative Negative   Bilirubin, UA     Ketones, UA neg    Spec Grav, UA     Blood, UA neg    pH, UA     POC,PROTEIN,UA Negative Negative, Trace, Small (1+), Moderate (2+), Large (3+), 4+   Urobilinogen, UA     Nitrite, UA neg    Leukocytes, UA Negative Negative   Appearance     Odor      Assessment & Plan:  High-risk pregnancy: G1P0000 at [redacted]w[redacted]d with an Estimated Date of Delivery: 03/17/22   1) CHTN, on labetalol 800mg  TID, nifedipine 30mg  daily, bp still suboptimal. Neg pre-e labs 4d ago. Asymptomatic, no proteinuria.   2) T2DM, on metformin 1000mg  BID and glyburide 2.5mg  qhs, did not discuss sugars today  3) Non-reactive NST w/ BPP 6/8 (-2 for breathing) w/ intermittent AEDF and elevated UAD 97%> discussed  w/ Dr. , to MAU for further eval/plan to repeat bpp/dopp in few hours. Notified Kooistra, CNM & Payne, CNM  Meds: No orders of the defined types were placed in this encounter.   Labs/procedures today: NST and U/S  Treatment Plan:  TBD  Follow-up: No follow-ups on file.   Future Appointments  Date Time Provider Department Center  02/17/2022 10:00 AM [facility] - [facility] Crissie Reese [facility] None  02/17/2022 10:50 AM [facility], DO [facility]  02/21/2022  9:50 AM [facility] NURSE [facility]  02/24/2022  9:45 AM [facility] Myna Hidalgo [facility] None  02/24/2022 10:30 AM 04/26/2022, MD [facility]  02/28/2022 10:30 AM [facility] NURSE [facility]  03/03/2022  9:45 AM [facility] Lazaro Arms [facility] None  03/03/2022 10:30 AM 05/03/2022, DO [facility]  03/07/2022 10:30 AM [facility] NURSE [facility]  03/10/2022  8:30 AM [facility] Myna Hidalgo [facility] None  03/10/2022  9:30 AM Cresenzo-Dishmon, 03/12/2022, CNM [facility]    No orders of the defined types were placed in this encounter.  [place] CNM, [facility] 02/14/2022 2:13 PM

## 2022-02-14 NOTE — Progress Notes (Signed)
   NURSE VISIT- NST  SUBJECTIVE:  Brianna [name] is a 31 y.o. G1P0000 female at [redacted]w[redacted]d, here for a NST for pregnancy complicated by [facility] and T2DM: Class B, currently on Glyburide/Metformin .  She reports active fetal movement, contractions: none, vaginal bleeding: none, membranes: intact.   OBJECTIVE:  LMP 06/10/2021   Appears well, no apparent distress  Results for orders placed or performed in visit on 02/14/22 (from the past 24 hour(s))  POC Urinalysis Dipstick OB   Collection Time: 02/14/22  1:30 PM  Result Value Ref Range   Color, UA     Clarity, UA     Glucose, UA Negative Negative   Bilirubin, UA     Ketones, UA neg    Spec Grav, UA     Blood, UA neg    pH, UA     POC,PROTEIN,UA Negative Negative, Trace, Small (1+), Moderate (2+), Large (3+), 4+   Urobilinogen, UA     Nitrite, UA neg    Leukocytes, UA Negative Negative   Appearance     Odor      NST: FHR baseline 140 bpm, Variability: moderate, Accelerations:present (10x10), Decelerations:  Absent= Cat 1/ reassuring but non-reactive Toco: none   ASSESSMENT: G1P0000 at [redacted]w[redacted]d with CHTN and T2DM: Class B, currently on Glyburide and Metformin NST non-reactive but reassuring.   PLAN: EFM strip reviewed by Joellyn Haff, CNM, [facility]   Recommendations: work-in for BPP    Jobe Marker  02/14/2022 1:54 PM

## 2022-02-15 DIAGNOSIS — E119 Type 2 diabetes mellitus without complications: Secondary | ICD-10-CM | POA: Diagnosis not present

## 2022-02-15 DIAGNOSIS — O2412 Pre-existing diabetes mellitus, type 2, in childbirth: Secondary | ICD-10-CM | POA: Diagnosis not present

## 2022-02-15 DIAGNOSIS — I160 Hypertensive urgency: Secondary | ICD-10-CM | POA: Diagnosis not present

## 2022-02-15 DIAGNOSIS — O1092 Unspecified pre-existing hypertension complicating childbirth: Secondary | ICD-10-CM | POA: Diagnosis not present

## 2022-02-15 DIAGNOSIS — O24419 Gestational diabetes mellitus in pregnancy, unspecified control: Secondary | ICD-10-CM | POA: Diagnosis not present

## 2022-02-15 DIAGNOSIS — O10913 Unspecified pre-existing hypertension complicating pregnancy, third trimester: Secondary | ICD-10-CM | POA: Diagnosis not present

## 2022-02-15 DIAGNOSIS — O9902 Anemia complicating childbirth: Secondary | ICD-10-CM | POA: Diagnosis not present

## 2022-02-15 DIAGNOSIS — Z7984 Long term (current) use of oral hypoglycemic drugs: Secondary | ICD-10-CM | POA: Diagnosis not present

## 2022-02-15 DIAGNOSIS — Z3A35 35 weeks gestation of pregnancy: Secondary | ICD-10-CM | POA: Diagnosis not present

## 2022-02-15 DIAGNOSIS — O2413 Pre-existing diabetes mellitus, type 2, in the puerperium: Secondary | ICD-10-CM | POA: Diagnosis not present

## 2022-02-15 DIAGNOSIS — O99215 Obesity complicating the puerperium: Secondary | ICD-10-CM | POA: Diagnosis not present

## 2022-02-15 DIAGNOSIS — Z79899 Other long term (current) drug therapy: Secondary | ICD-10-CM | POA: Diagnosis not present

## 2022-02-15 DIAGNOSIS — O99354 Diseases of the nervous system complicating childbirth: Secondary | ICD-10-CM | POA: Diagnosis not present

## 2022-02-15 DIAGNOSIS — O99355 Diseases of the nervous system complicating the puerperium: Secondary | ICD-10-CM | POA: Diagnosis not present

## 2022-02-15 DIAGNOSIS — O99214 Obesity complicating childbirth: Secondary | ICD-10-CM | POA: Diagnosis not present

## 2022-02-15 DIAGNOSIS — O1093 Unspecified pre-existing hypertension complicating the puerperium: Secondary | ICD-10-CM | POA: Diagnosis not present

## 2022-02-15 DIAGNOSIS — G4733 Obstructive sleep apnea (adult) (pediatric): Secondary | ICD-10-CM | POA: Diagnosis not present

## 2022-02-15 DIAGNOSIS — O113 Pre-existing hypertension with pre-eclampsia, third trimester: Secondary | ICD-10-CM | POA: Diagnosis not present

## 2022-02-15 DIAGNOSIS — O115 Pre-existing hypertension with pre-eclampsia, complicating the puerperium: Secondary | ICD-10-CM | POA: Diagnosis not present

## 2022-02-15 DIAGNOSIS — O99285 Endocrine, nutritional and metabolic diseases complicating the puerperium: Secondary | ICD-10-CM | POA: Diagnosis not present

## 2022-02-15 DIAGNOSIS — O1413 Severe pre-eclampsia, third trimester: Secondary | ICD-10-CM | POA: Diagnosis not present

## 2022-02-15 DIAGNOSIS — O43893 Other placental disorders, third trimester: Secondary | ICD-10-CM | POA: Diagnosis not present

## 2022-02-15 DIAGNOSIS — O99284 Endocrine, nutritional and metabolic diseases complicating childbirth: Secondary | ICD-10-CM | POA: Diagnosis not present

## 2022-02-15 DIAGNOSIS — Z79890 Hormone replacement therapy (postmenopausal): Secondary | ICD-10-CM | POA: Diagnosis not present

## 2022-02-15 DIAGNOSIS — O368330 Maternal care for abnormalities of the fetal heart rate or rhythm, third trimester, not applicable or unspecified: Secondary | ICD-10-CM | POA: Diagnosis not present

## 2022-02-15 DIAGNOSIS — O114 Pre-existing hypertension with pre-eclampsia, complicating childbirth: Secondary | ICD-10-CM | POA: Diagnosis not present

## 2022-02-15 DIAGNOSIS — G4730 Sleep apnea, unspecified: Secondary | ICD-10-CM | POA: Diagnosis not present

## 2022-02-15 DIAGNOSIS — O24113 Pre-existing diabetes mellitus, type 2, in pregnancy, third trimester: Secondary | ICD-10-CM | POA: Diagnosis not present

## 2022-02-15 DIAGNOSIS — E039 Hypothyroidism, unspecified: Secondary | ICD-10-CM | POA: Diagnosis not present

## 2022-02-15 DIAGNOSIS — O9903 Anemia complicating the puerperium: Secondary | ICD-10-CM | POA: Diagnosis not present

## 2022-02-16 DIAGNOSIS — G4733 Obstructive sleep apnea (adult) (pediatric): Secondary | ICD-10-CM | POA: Diagnosis not present

## 2022-02-16 DIAGNOSIS — O99285 Endocrine, nutritional and metabolic diseases complicating the puerperium: Secondary | ICD-10-CM | POA: Diagnosis not present

## 2022-02-16 DIAGNOSIS — Z79899 Other long term (current) drug therapy: Secondary | ICD-10-CM | POA: Diagnosis not present

## 2022-02-16 DIAGNOSIS — E039 Hypothyroidism, unspecified: Secondary | ICD-10-CM | POA: Diagnosis not present

## 2022-02-16 DIAGNOSIS — O99215 Obesity complicating the puerperium: Secondary | ICD-10-CM | POA: Diagnosis not present

## 2022-02-16 DIAGNOSIS — Z7984 Long term (current) use of oral hypoglycemic drugs: Secondary | ICD-10-CM | POA: Diagnosis not present

## 2022-02-16 DIAGNOSIS — O9903 Anemia complicating the puerperium: Secondary | ICD-10-CM | POA: Diagnosis not present

## 2022-02-16 DIAGNOSIS — O2413 Pre-existing diabetes mellitus, type 2, in the puerperium: Secondary | ICD-10-CM | POA: Diagnosis not present

## 2022-02-16 DIAGNOSIS — O1093 Unspecified pre-existing hypertension complicating the puerperium: Secondary | ICD-10-CM | POA: Diagnosis not present

## 2022-02-16 DIAGNOSIS — E119 Type 2 diabetes mellitus without complications: Secondary | ICD-10-CM | POA: Diagnosis not present

## 2022-02-16 DIAGNOSIS — O115 Pre-existing hypertension with pre-eclampsia, complicating the puerperium: Secondary | ICD-10-CM | POA: Diagnosis not present

## 2022-02-16 DIAGNOSIS — O99355 Diseases of the nervous system complicating the puerperium: Secondary | ICD-10-CM | POA: Diagnosis not present

## 2022-02-17 ENCOUNTER — Encounter: Payer: BC Managed Care – PPO | Admitting: Obstetrics & Gynecology

## 2022-02-17 ENCOUNTER — Other Ambulatory Visit: Payer: BC Managed Care – PPO

## 2022-02-19 DIAGNOSIS — Z98891 History of uterine scar from previous surgery: Secondary | ICD-10-CM | POA: Insufficient documentation

## 2022-02-19 DIAGNOSIS — O119 Pre-existing hypertension with pre-eclampsia, unspecified trimester: Secondary | ICD-10-CM | POA: Insufficient documentation

## 2022-02-21 ENCOUNTER — Other Ambulatory Visit: Payer: BC Managed Care – PPO

## 2022-02-23 ENCOUNTER — Encounter: Payer: Self-pay | Admitting: Obstetrics & Gynecology

## 2022-02-24 ENCOUNTER — Encounter: Payer: BC Managed Care – PPO | Admitting: Obstetrics & Gynecology

## 2022-02-24 ENCOUNTER — Other Ambulatory Visit: Payer: BC Managed Care – PPO

## 2022-02-26 DIAGNOSIS — G4733 Obstructive sleep apnea (adult) (pediatric): Secondary | ICD-10-CM | POA: Diagnosis not present

## 2022-02-28 ENCOUNTER — Ambulatory Visit (INDEPENDENT_AMBULATORY_CARE_PROVIDER_SITE_OTHER): Payer: BC Managed Care – PPO | Admitting: *Deleted

## 2022-02-28 ENCOUNTER — Other Ambulatory Visit: Payer: BC Managed Care – PPO

## 2022-02-28 ENCOUNTER — Other Ambulatory Visit: Payer: Self-pay | Admitting: *Deleted

## 2022-02-28 DIAGNOSIS — I10 Essential (primary) hypertension: Secondary | ICD-10-CM

## 2022-02-28 NOTE — Progress Notes (Signed)
   NURSE VISIT- BLOOD PRESSURE CHECK  SUBJECTIVE:  Brianna [name] is a 31 y.o. G48P0000 female here for BP check. She is postpartum, delivery date 02/15/22     HYPERTENSION ROS:  Pregnant/postpartum:  Severe headaches that don't go away with tylenol/other medicines: No  Visual changes (seeing spots/double/blurred vision) No  Severe pain under right breast breast or in center of upper chest No  Severe nausea/vomiting No  Taking medicines as instructed yes    OBJECTIVE:  BP 136/86 (BP Location: Right Arm, Patient Position: Sitting, Cuff Size: Normal)   Pulse 98   Breastfeeding No   Appearance alert, well appearing, and in no distress.  ASSESSMENT: Postpartum  blood pressure check  PLAN: Discussed with Dr. Charlotta Newton   Recommendations: no changes needed   Follow-up: as scheduled   Annamarie Dawley  02/28/2022 11:52 AM

## 2022-03-03 ENCOUNTER — Other Ambulatory Visit: Payer: BC Managed Care – PPO

## 2022-03-03 ENCOUNTER — Encounter: Payer: BC Managed Care – PPO | Admitting: Obstetrics & Gynecology

## 2022-03-07 ENCOUNTER — Other Ambulatory Visit: Payer: BC Managed Care – PPO

## 2022-03-07 MED ORDER — GLYBURIDE 2.5 MG PO TABS: 2.5000 mg | ORAL_TABLET | Freq: Every day | ORAL | 1 refills | Status: DC

## 2022-03-08 DIAGNOSIS — Z09 Encounter for follow-up examination after completed treatment for conditions other than malignant neoplasm: Secondary | ICD-10-CM | POA: Diagnosis not present

## 2022-03-10 ENCOUNTER — Other Ambulatory Visit: Payer: BC Managed Care – PPO

## 2022-03-10 ENCOUNTER — Encounter: Payer: BC Managed Care – PPO | Admitting: Advanced Practice Midwife

## 2022-04-06 ENCOUNTER — Ambulatory Visit (INDEPENDENT_AMBULATORY_CARE_PROVIDER_SITE_OTHER): Payer: BC Managed Care – PPO | Admitting: Obstetrics & Gynecology

## 2022-04-06 ENCOUNTER — Encounter: Payer: Self-pay | Admitting: Obstetrics & Gynecology

## 2022-04-06 DIAGNOSIS — I10 Essential (primary) hypertension: Secondary | ICD-10-CM | POA: Diagnosis not present

## 2022-04-06 DIAGNOSIS — E119 Type 2 diabetes mellitus without complications: Secondary | ICD-10-CM

## 2022-04-06 DIAGNOSIS — Z3202 Encounter for pregnancy test, result negative: Secondary | ICD-10-CM | POA: Diagnosis not present

## 2022-04-06 DIAGNOSIS — Z30017 Encounter for initial prescription of implantable subdermal contraceptive: Secondary | ICD-10-CM | POA: Diagnosis not present

## 2022-04-06 LAB — POCT URINE PREGNANCY: Preg Test, Ur: NEGATIVE

## 2022-04-06 MED ORDER — ENALAPRIL MALEATE 20 MG PO TABS: 20.0000 mg | ORAL_TABLET | Freq: Every day | ORAL | 11 refills | Status: DC

## 2022-04-06 MED ORDER — ETONOGESTREL 68 MG ~~LOC~~ IMPL
68.0000 mg | DRUG_IMPLANT | Freq: Once | SUBCUTANEOUS | Status: AC
  Administered 2022-04-06: 68 mg via SUBCUTANEOUS

## 2022-04-06 NOTE — Progress Notes (Signed)
POSTPARTUM VISIT Patient name: Brianna [name] MRN [id number]  Date of birth: 07-25-1991 Chief Complaint:   Postpartum Care  History of Present Illness:   Brianna [name] is a 31 y.o. G49P0000 female being seen today for a postpartum visit. She is 5 weeks postpartum following a primary cesarean section, low transverse incision at 3525 gestational weeks. IOL: Yes, for  chronic HTN with superimposed preeclampsia .   Pt is non-compliant with BP meds- she was taking Labetalol Nifedipine and Enalpril.  Notes she didn't feel well and that's why she stopped   Pregnancy complicated by Preeclampsia with severe features, Type 2 DM, Sleep apnea, Anemia, Obesity .  Last pap smear: 07/2020 negative    Postpartum course has been uncomplicated.  Bleeding  she has had a period since delivery- no issues . Bowel function is normal. Bladder function is normal. Urinary incontinence? No, fecal incontinence? No Patient is sexually active. Last sexual activity: ~ 1 week ago- used condom.   Desired contraception: Nexplanon. Patient does not want a pregnancy in the future.   Desired family size is 1 children.   Upstream - 04/06/22 1610       Pregnancy Intention Screening   Does the patient want to become pregnant in the next year? No    Does the patient's partner want to become pregnant in the next year? No    Would the patient like to discuss contraceptive options today? Yes      Contraception Wrap Up   Current Method No Method - Other Reason    Contraception Counseling Provided Yes            The pregnancy intention screening data noted above was reviewed. Potential methods of contraception were discussed. The patient elected to proceed with No data recorded.  Edinburgh Postpartum Depression Screening: Negative  Edinburgh Postnatal Depression Scale - 04/06/22 1611       Edinburgh Postnatal Depression Scale:  In the Past 7 Days   I have been able to laugh and see the funny side of things. 0    I  have looked forward with enjoyment to things. 0    I have blamed myself unnecessarily when things went wrong. 0    I have been anxious or worried for no good reason. 0    I have felt scared or panicky for no good reason. 0    Things have been getting on top of me. 0    I have been so unhappy that I have had difficulty sleeping. 0    I have felt sad or miserable. 0    I have been so unhappy that I have been crying. 1    The thought of harming myself has occurred to me. 0    Edinburgh Postnatal Depression Scale Total 1             Baby's course has been uncomplicated. Baby is feeding by bottle. Infant has a pediatrician/family doctor? Yes.  Childcare strategy if returning to work/school: going back to work- her parents are going to watch a baby.  Pt has material needs met for her and baby: Yes.    Review of Systems:   Pertinent items are noted in HPI Denies Abnormal vaginal discharge w/ itching/odor/irritation, headaches, visual changes, shortness of breath, chest pain, abdominal pain, severe nausea/vomiting, or problems with urination or bowel movements. Pertinent History Reviewed:  Reviewed past medical,surgical, obstetrical and family history.  Reviewed problem list, medications and allergies. OB History  Gravida Para Term Preterm  AB Living  1 0 0 0 0 0  SAB IAB Ectopic Multiple Live Births  0 0 0 0 0    # Outcome Date GA Lbr Len/2nd Weight Sex Delivery Anes PTL Lv  1 Gravida            Physical Assessment:   Vitals:   04/06/22 1609 04/06/22 1618  BP: (!) 160/94 (!) 173/96  Pulse: 94 85  Weight: 276 lb 12.8 oz (125.6 kg)   Height: 5' 5"  (1.651 m)   Body mass index is 46.06 kg/m.       Physical Examination:   General appearance: alert, well appearing, and in no distress  Mental status: normal mood, behavior, speech, dress, motor activity, and thought processes  Skin: warm & dry   Cardiovascular: RRR  Respiratory: CTAB  Breasts: no masses or abnormalities noted    Abdomen: obese, soft, non-tender, incision well healed- C/D/I  Pelvic: VULVA: normal appearing vulva with no masses, tenderness or lesions, VAGINA: normal appearing vagina with normal color and discharge, no lesions, CERVIX: normal appearing cervix without discharge or lesions, UTERUS: uterus is normal size, shape, consistency and nontender, ADNEXA: normal adnexa in size, nontender and no masses- exam limited due to body habitus  Extremities: no edema  Chaperone:  pt declined      NEXPLANON INSERTION  Risks/benefits/side effects of Nexplanon have been discussed and her questions have been answered.  Specifically, a failure rate of 07/998 has been reported, with an increased failure rate if pt takes [place] and/or antiseizure medicaitons.  She is aware of the common side effect of irregular bleeding, which the incidence of decreases over time. Signed copy of informed consent in chart.   Time out was performed.  She is left-handed, so her right arm, approximately 10cm from the medial epicondyle and 3-5cm posterior to the sulcus, was cleansed with alcohol and anesthetized with 2cc of 2% Lidocaine.  The area was cleansed again with betadine and the Nexplanon was inserted per manufacturer's recommendations without difficulty.  3 steri-strips and pressure bandage were applied. The patient tolerated the procedure well.    Assessment & Plan:  1) Postpartum exam 2) 6 wks s/p primary cesarean section, low transverse incision 3) bottle feeding 4) Depression screening 5) Contraception management: Nexplanon placed, encouraged back up method- condoms or pull out -due to recent contraception, encouraged repeat urine pregnancy test in 2 weeks or if missed menses  -Nexplanon insertion Pt was instructed to keep the area clean and dry, remove pressure bandage in 24 hours, and keep insertion site covered with the steri-strip for 3-5 days.  Back up contraception was recommended for 2 weeks.  She was  given a card indicating date Nexplanon was inserted and date it needs to be removed. Follow-up PRN problems.    Essential components of care per ACOG recommendations:  1.  Mood and well being:  If positive depression screen, discussed and plan developed.  If using tobacco we discussed reduction/cessation and risk of relapse If current substance abuse, we discussed and referral to local resources was offered.   2. Infant care and feeding:  If breastfeeding, discussed returning to work, pumping, breastfeeding-associated pain, guidance regarding return to fertility while lactating if not using another method Recommended that all caregivers be immunized for flu, pertussis and other preventable communicable diseases  3. Sexuality, contraception and birth spacing Provided guidance regarding sexuality, management of dyspareunia, and resumption of intercourse Discussed avoiding interpregnancy interval <11mhs and recommended birth spacing of 18 months  4. Sleep and fatigue Discussed coping options for fatigue and sleep disruption Encouraged family/partner/community support of 4 hrs of uninterrupted sleep to help with mood and fatigue  5. Physical recovery  Encouraged pelvic floor exercises Patient is safe to resume physical activity. Discussed attainment of healthy weight.  6.  Chronic disease management Discussed pregnancy complications if any, and their implications for future childbearing and long-term maternal health. Change to vastoec 5m daily to avoid multiple medications Strongly encouraged pt to follow up with PCP ASAP  7. Health maintenance Mammogram at [place]yoor earlier if indicated Pap smears up to date, reviewed guidelines  Meds:  Meds ordered this encounter  Medications   etonogestrel (NEXPLANON) implant 68 mg   enalapril (VASOTEC) 20 MG tablet    Sig: Take 1 tablet (20 mg total) by mouth daily.    Dispense:  30 tablet    Refill:  11    Follow-up: No follow-ups on  file.   Orders Placed This Encounter  Procedures   POCT urine pregnancy    JJanyth Pupa DO Attending [facility] Beachfor [employer] [place]

## 2022-04-15 ENCOUNTER — Encounter: Payer: Self-pay | Admitting: Nurse Practitioner

## 2022-04-15 ENCOUNTER — Ambulatory Visit (INDEPENDENT_AMBULATORY_CARE_PROVIDER_SITE_OTHER): Payer: BC Managed Care – PPO | Admitting: Nurse Practitioner

## 2022-04-15 VITALS — BP 162/100 | HR 84 | Temp 97.3°F | Ht 65.0 in | Wt 277.0 lb

## 2022-04-15 DIAGNOSIS — E119 Type 2 diabetes mellitus without complications: Secondary | ICD-10-CM

## 2022-04-15 DIAGNOSIS — E1169 Type 2 diabetes mellitus with other specified complication: Secondary | ICD-10-CM | POA: Diagnosis not present

## 2022-04-15 DIAGNOSIS — E785 Hyperlipidemia, unspecified: Secondary | ICD-10-CM | POA: Diagnosis not present

## 2022-04-15 DIAGNOSIS — E038 Other specified hypothyroidism: Secondary | ICD-10-CM

## 2022-04-15 DIAGNOSIS — I10 Essential (primary) hypertension: Secondary | ICD-10-CM

## 2022-04-15 MED ORDER — AMLODIPINE BESYLATE 5 MG PO TABS: 5.0000 mg | ORAL_TABLET | Freq: Every day | ORAL | 0 refills | Status: DC

## 2022-04-15 MED ORDER — TRULICITY 0.75 MG/0.5ML ~~LOC~~ SOAJ: 0.7500 mg | SUBCUTANEOUS | 0 refills | Status: DC

## 2022-04-15 NOTE — Patient Instructions (Signed)
Stop Labetaolol Start Amlodipine Check BP daily and send results in 2 weeks Then start Trulicity

## 2022-04-15 NOTE — Progress Notes (Unsigned)
Subjective:    Patient ID: Brianna [name], female    DOB: Nov 15, 1990, 31 y.o.   MRN: [id number]  HPI Hypertension and weight loss medications follow up , recently had a baby and requests to get back on mounjaro and recheck BP.  Delivered about 2 months ago.  Is not breast-feeding.  Has Nexplanon for birth control.  Since delivering patient has not been checking her blood sugars at home.  States her sugars did well during pregnancy.  Also not checking her blood pressures.  Has been off her nifedipine.  Has not done very well with her diet.  Her main sugary beverage is Dr. Malachi Bonds.  Has difficulty remembering to take her labetalol 3 times a day, currently taking it twice a day.  Has also been on glyburide, wonders if she should go back to glipizide.  Thinks it has been over a year since she had her eye exam.    Review of Systems  HENT:  Negative for sore throat and trouble swallowing.   Respiratory:  Negative for chest tightness and shortness of breath.   Cardiovascular:  Negative for chest pain, palpitations and leg swelling.       Objective:   Physical Exam NAD.  Alert, oriented.  Thyroid nontender to palpation, no mass or goiter noted.  Lungs clear.  Heart regular rate rhythm. Diabetic Foot Exam - Simple   Simple Foot Form  04/15/2022  3:30 PM  Visual Inspection No deformities, no ulcerations, no other skin breakdown bilaterally: Yes Sensation Testing Intact to touch and monofilament testing bilaterally: Yes Pulse Check Posterior Tibialis and Dorsalis pulse intact bilaterally: Yes Comments Some calluses noted.     Today's Vitals   04/15/22 1502 04/15/22 1607  BP: (!) 140/80 (!) 162/100  Pulse: 84   Temp: (!) 97.3 F (36.3 C)   SpO2: 98%   Weight: 277 lb (125.6 kg)   Height: 5\' 5"  (1.651 m)    Body mass index is 46.1 kg/m.        Assessment & Plan:   Problem List Items Addressed This Visit       Cardiovascular and Mediastinum   HTN (hypertension) - Primary    Relevant Medications   amLODipine (NORVASC) 5 MG tablet   Other Relevant Orders   CBC with Differential/Platelet (Completed)   Comprehensive metabolic panel (Completed)   Lipid panel (Completed)   TSH (Completed)   Hemoglobin A1c (Completed)     Endocrine   Hyperlipidemia associated with type 2 diabetes mellitus (HCC)   Relevant Medications   amLODipine (NORVASC) 5 MG tablet   Dulaglutide (TRULICITY) 9.93 ZJ/6.9CV SOPN   Other Relevant Orders   CBC with Differential/Platelet (Completed)   Comprehensive metabolic panel (Completed)   Lipid panel (Completed)   TSH (Completed)   Hemoglobin A1c (Completed)   Hypothyroidism   Relevant Orders   CBC with Differential/Platelet (Completed)   Comprehensive metabolic panel (Completed)   Lipid panel (Completed)   TSH (Completed)   Hemoglobin A1c (Completed)   Type 2 diabetes mellitus (HCC)   Relevant Medications   Dulaglutide (TRULICITY) 8.93 YB/0.1BP SOPN   Other Relevant Orders   CBC with Differential/Platelet (Completed)   Comprehensive metabolic panel (Completed)   Lipid panel (Completed)   TSH (Completed)   Hemoglobin A1c (Completed)     Other   Morbid obesity (HCC)   Relevant Medications   Dulaglutide (TRULICITY) 1.02 HE/5.2DP SOPN   Other Relevant Orders   CBC with Differential/Platelet (Completed)   Comprehensive metabolic panel (Completed)  Lipid panel (Completed)   TSH (Completed)   Hemoglobin A1c (Completed)   Meds ordered this encounter  Medications   amLODipine (NORVASC) 5 MG tablet    Sig: Take 1 tablet (5 mg total) by mouth daily.    Dispense:  30 tablet    Refill:  0    Order Specific Question:   Supervising Provider    Answer:   Sallee Lange A [[id number]]   Dulaglutide (TRULICITY) [id number] SOPN    Sig: Inject 0.75 mg into the skin once a week.    Dispense:  2 mL    Refill:  0    Order Specific Question:   Supervising Provider    Answer:   Sallee Lange A [[id number]]   Stop labetalol.  Switch to  amlodipine 5 mg daily.  Recommend patient monitor BP outside the office and to call back if no significant improvement over the next 2 to 3 weeks.  Our initial goal is below 140/90. After taking amlodipine for a couple of weeks, stop glyburide and start on Trulicity as directed.  We will hold on Mounjaro due to formulary and prior authorization issues.  Plan to titrate the amlodipine dose if needed. Recommend she check blood sugar several times a week and record. Recommend eye exam sometime in the next few months.  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 Labs ordered. Discussed importance of healthy diet including reducing the amount of sugary beverages. Return for Recheck 4 weeks after starting Trulicity.

## 2022-04-16 LAB — COMPREHENSIVE METABOLIC PANEL
ALT: 11 IU/L (ref 0–32)
AST: 9 IU/L (ref 0–40)
Albumin/Globulin Ratio: 1.3 (ref 1.2–2.2)
Albumin: 4.1 g/dL (ref 4.0–5.0)
Alkaline Phosphatase: 66 IU/L (ref 44–121)
BUN/Creatinine Ratio: 13 (ref 9–23)
BUN: 9 mg/dL (ref 6–20)
Bilirubin Total: 0.3 mg/dL (ref 0.0–1.2)
CO2: 20 mmol/L (ref 20–29)
Calcium: 9.8 mg/dL (ref 8.7–10.2)
Chloride: 105 mmol/L (ref 96–106)
Creatinine, Ser: 0.7 mg/dL (ref 0.57–1.00)
Globulin, Total: 3.2 g/dL (ref 1.5–4.5)
Glucose: 96 mg/dL (ref 70–99)
Potassium: 4.5 mmol/L (ref 3.5–5.2)
Sodium: 139 mmol/L (ref 134–144)
Total Protein: 7.3 g/dL (ref 6.0–8.5)
eGFR: 119 mL/min/{1.73_m2} (ref >59)

## 2022-04-16 LAB — LIPID PANEL
Chol/HDL Ratio: 3.9 ratio (ref 0.0–4.4)
Cholesterol, Total: 186 mg/dL (ref 100–199)
HDL: 48 mg/dL (ref >39)
LDL Chol Calc (NIH): 125 mg/dL — ABNORMAL HIGH (ref 0–99)
Triglycerides: 72 mg/dL (ref 0–149)
VLDL Cholesterol Cal: 13 mg/dL (ref 5–40)

## 2022-04-16 LAB — CBC WITH DIFFERENTIAL/PLATELET
Basophils Absolute: 0 10*3/uL (ref 0.0–0.2)
Basos: 1 %
EOS (ABSOLUTE): 0.2 10*3/uL (ref 0.0–0.4)
Eos: 3 %
Hematocrit: 29.1 % — ABNORMAL LOW (ref 34.0–46.6)
Hemoglobin: 9.4 g/dL — ABNORMAL LOW (ref 11.1–15.9)
Immature Grans (Abs): 0 10*3/uL (ref 0.0–0.1)
Immature Granulocytes: 0 %
Lymphocytes Absolute: 1.3 10*3/uL (ref 0.7–3.1)
Lymphs: 26 %
MCH: 26.6 pg (ref 26.6–33.0)
MCHC: 32.3 g/dL (ref 31.5–35.7)
MCV: 82 fL (ref 79–97)
Monocytes Absolute: 0.5 10*3/uL (ref 0.1–0.9)
Monocytes: 9 %
Neutrophils Absolute: 3.1 10*3/uL (ref 1.4–7.0)
Neutrophils: 61 %
Platelets: 269 10*3/uL (ref 150–450)
RBC: 3.54 x10E6/uL — ABNORMAL LOW (ref 3.77–5.28)
RDW: 13 % (ref 11.7–15.4)
WBC: 5.1 10*3/uL (ref 3.4–10.8)

## 2022-04-16 LAB — HEMOGLOBIN A1C
Est. average glucose Bld gHb Est-mCnc: 117 mg/dL
Hgb A1c MFr Bld: 5.7 % — ABNORMAL HIGH (ref 4.8–5.6)

## 2022-04-16 LAB — TSH: TSH: 3.74 u[IU]/mL (ref 0.450–4.500)

## 2022-04-28 ENCOUNTER — Encounter: Payer: Self-pay | Admitting: Nurse Practitioner

## 2022-04-29 DIAGNOSIS — Z1379 Encounter for other screening for genetic and chromosomal anomalies: Secondary | ICD-10-CM | POA: Diagnosis not present

## 2022-04-30 ENCOUNTER — Other Ambulatory Visit: Payer: Self-pay | Admitting: Nurse Practitioner

## 2022-04-30 MED ORDER — AMLODIPINE BESYLATE 10 MG PO TABS: 10.0000 mg | ORAL_TABLET | Freq: Every day | ORAL | 0 refills | Status: DC

## 2022-05-13 ENCOUNTER — Other Ambulatory Visit: Payer: Self-pay | Admitting: Nurse Practitioner

## 2022-05-20 ENCOUNTER — Ambulatory Visit (INDEPENDENT_AMBULATORY_CARE_PROVIDER_SITE_OTHER): Payer: BC Managed Care – PPO | Admitting: Nurse Practitioner

## 2022-05-20 VITALS — BP 135/86 | HR 93 | Temp 97.9°F | Ht 65.0 in | Wt 269.0 lb

## 2022-05-20 DIAGNOSIS — L300 Nummular dermatitis: Secondary | ICD-10-CM

## 2022-05-20 DIAGNOSIS — I10 Essential (primary) hypertension: Secondary | ICD-10-CM | POA: Diagnosis not present

## 2022-05-20 DIAGNOSIS — E119 Type 2 diabetes mellitus without complications: Secondary | ICD-10-CM

## 2022-05-20 DIAGNOSIS — Z23 Encounter for immunization: Secondary | ICD-10-CM

## 2022-05-20 MED ORDER — MOMETASONE FUROATE 0.1 % EX CREA: TOPICAL_CREAM | CUTANEOUS | 0 refills | Status: DC

## 2022-05-20 MED ORDER — TRULICITY 1.5 MG/0.5ML ~~LOC~~ SOAJ: 1.5000 mg | SUBCUTANEOUS | 2 refills | Status: DC

## 2022-05-20 NOTE — Progress Notes (Unsigned)
   Subjective:    Patient ID: [name], female    DOB: 12/30/1990, 30 y.o.   MRN: [id number]  Hypertension This is a recurrent problem.  Rash  Located on R buttock has flared in the past  Presents for follow-up on her blood pressure.  Doing well on current medication regimen.  Denies any edema associated with amlodipine use. Doing well on current dose of Trulicity 9.41 mg, would like to go up to the next dose.  Denies any abdominal pain, nausea or vomiting.  Has been actively working on trying to lose weight. In addition has had a flareup of her rash at the bottom of her right buttock for the past few days.  Very dry and pruritic.  Has tried topical moisturizers and has used triamcinolone in the past with minimal improvement.  Last seen for this on 10/05/2021.  Minimal rash on the left buttock area.  Denies chest pain/ischemic type pain shortness of breath or edema.      Objective:   Physical Exam NAD.  Alert, oriented.  Calm cheerful affect.  Lungs clear.  Heart regular rate rhythm.  Abdomen soft nondistended nontender.  Lower extremities no edema.  At the base of the right buttock/upper thigh area there is a patch of dry skin with several nonraised circular flat lesions mild hyperpigmentation and slight scaliness.  Minimal lesions to the left buttock. Today's Vitals   05/20/22 1435  BP: 135/86  Pulse: 93  Temp: 97.9 F (36.6 C)  SpO2: 99%  Weight: 269 lb (122 kg)  Height: 5\' 5"  (1.651 m)   Body mass index is 44.76 kg/m.  Weight loss 8 pounds since last visit in September. Previous BP 162/100.     Assessment & Plan:   Problem List Items Addressed This Visit       Cardiovascular and Mediastinum   HTN (hypertension) - Primary     Endocrine   Type 2 diabetes mellitus (HCC)   Relevant Medications   Dulaglutide (TRULICITY) 1.5 DE/0.8XK SOPN     Musculoskeletal and Integument   Nummular eczematous dermatitis   Other Visit Diagnoses     Immunization due        Relevant Orders   Flu Vaccine QUAD 11mo+IM (Fluarix, Fluzone & Alfiuria Quad PF) (Completed)      Meds ordered this encounter  Medications   Dulaglutide (TRULICITY) 1.5 GY/1.8HU SOPN    Sig: Inject 1.5 mg into the skin once a week.    Dispense:  2 mL    Refill:  2    Please note dose change    Order Specific Question:   Supervising Provider    Answer:   Sallee Lange A [[id number]]   mometasone (ELOCON) 0.1 % cream    Sig: Apply to affected area once daily up to 2 weeks at a time    Dispense:  15 g    Refill:  0    Order Specific Question:   Supervising Provider    Answer:   Sallee Lange A [[id number]]   BP much improved from previous visit.  Continue current medication regimen. Increase Trulicity dose to 1.5 mg per patient request.  Contact office if any adverse effects with new dosage.  Encourage continued weight loss efforts. Rash has the appearance of eczema, probable Nummular eczema.  Switch to Elocon cream as directed.  Given samples of Cerave cream for dry skin.  Call back if worsens or persist. Flu vaccine today. Return in about 3 months (around 08/20/2022).

## 2022-05-21 ENCOUNTER — Other Ambulatory Visit: Payer: Self-pay | Admitting: Nurse Practitioner

## 2022-05-21 ENCOUNTER — Encounter: Payer: Self-pay | Admitting: Nurse Practitioner

## 2022-06-06 ENCOUNTER — Other Ambulatory Visit (HOSPITAL_COMMUNITY)
Admission: RE | Admit: 2022-06-06 | Discharge: 2022-06-06 | Disposition: A | Discharge status: Home / Self Care | Payer: BC Managed Care – PPO | Source: Ambulatory Visit | Attending: Obstetrics & Gynecology | Admitting: Obstetrics & Gynecology

## 2022-06-06 ENCOUNTER — Other Ambulatory Visit (INDEPENDENT_AMBULATORY_CARE_PROVIDER_SITE_OTHER): Payer: BC Managed Care – PPO

## 2022-06-06 DIAGNOSIS — Z113 Encounter for screening for infections with a predominantly sexual mode of transmission: Secondary | ICD-10-CM | POA: Insufficient documentation

## 2022-06-06 NOTE — Progress Notes (Signed)
   NURSE VISIT- VAGINITIS/STD/POC  SUBJECTIVE:  Brianna [name] is a 31 y.o. G1P0000 GYN patientfemale here for a vaginal swab for STD screen.  She reports the following symptoms: none for no  days . Denies abnormal vaginal bleeding, significant pelvic pain, fever, or UTI symptoms.  Patient states that her partner was with someone else and she just wants to be checked. OBJECTIVE:  There were no vitals taken for this visit.  Appears well, in no apparent distress  ASSESSMENT: Vaginal swab for STD screen  PLAN: Self-collected vaginal probe for Gonorrhea, Chlamydia, Trichomonas, Bacterial Vaginosis, Yeast sent to lab Treatment: to be determined once results are received Follow-up as needed if symptoms persist/worsen, or new symptoms develop  Janalyn Shy  06/06/2022 10:50 AM

## 2022-06-07 LAB — CERVICOVAGINAL ANCILLARY ONLY
Bacterial Vaginitis (gardnerella): NEGATIVE
Candida Glabrata: NEGATIVE
Candida Vaginitis: NEGATIVE
Chlamydia: NEGATIVE
Comment: NEGATIVE
Comment: NEGATIVE
Comment: NEGATIVE
Comment: NEGATIVE
Comment: NEGATIVE
Comment: NORMAL
Neisseria Gonorrhea: NEGATIVE
Trichomonas: NEGATIVE

## 2022-06-15 ENCOUNTER — Encounter: Payer: Self-pay | Admitting: Obstetrics & Gynecology

## 2022-07-06 ENCOUNTER — Ambulatory Visit (INDEPENDENT_AMBULATORY_CARE_PROVIDER_SITE_OTHER): Payer: BC Managed Care – PPO | Admitting: Obstetrics & Gynecology

## 2022-07-06 ENCOUNTER — Encounter: Payer: Self-pay | Admitting: Obstetrics & Gynecology

## 2022-07-06 VITALS — BP 174/105 | HR 101 | Ht 65.0 in | Wt 277.2 lb

## 2022-07-06 DIAGNOSIS — Z3009 Encounter for other general counseling and advice on contraception: Secondary | ICD-10-CM | POA: Diagnosis not present

## 2022-07-06 DIAGNOSIS — N939 Abnormal uterine and vaginal bleeding, unspecified: Secondary | ICD-10-CM

## 2022-07-06 DIAGNOSIS — I10 Essential (primary) hypertension: Secondary | ICD-10-CM | POA: Diagnosis not present

## 2022-07-06 NOTE — Progress Notes (Signed)
GYN VISIT Patient name: Brianna [name] MRN [id number]  Date of birth: [birth year] Chief Complaint:   Follow-up (On meds)  History of Present Illness:   Brianna [name] is a 31 y.o. G70P0100 female being seen today for follow up regarding the following:     Contraception: Nexplanon (placed 03/2022 during postpartum visit)- in November had period that lasted 2 mos- typically on a heavy day would used 5 regular pads per day.  Mostly the period was light and did not require more than a pad or so.  Bleeding stopped about 1 1/2wks ago.  Initially she was thinking that she wanted to transition to something else, but now she is not so sure.  Previously had Depot and also noted irregular bleeding.  Without contraception, menses are regular and only last for 5 days  HTN- followed by PCP, medication recently changed.  Pt notes that she is compliant with meds     05/20/2022    2:41 PM 04/15/2022    3:06 PM 12/16/2021    3:23 PM 09/27/2021    1:11 PM 09/13/2021   10:29 AM  Depression screen PHQ 2/9  Decreased Interest 0 0 0 0 0  Down, Depressed, Hopeless 0 0 0 0 0  PHQ - 2 Score 0 0 0 0 0  Altered sleeping 0  1  1  Tired, decreased energy 0  1  1  Change in appetite 0  1  0  Feeling bad or failure about yourself  0  0  0  Trouble concentrating 0  0  0  Moving slowly or fidgety/restless 0  0  0  Suicidal thoughts 0  0  0  PHQ-9 Score 0  3  2  Difficult doing work/chores Not difficult at all         Review of Systems:   Pertinent items are noted in HPI Denies fever/chills, dizziness, headaches, visual disturbances, fatigue, shortness of breath, chest pain, abdominal pain, vomiting. Pertinent History Reviewed:  Reviewed past medical,surgical, social, obstetrical and family history.  Reviewed problem list, medications and allergies. Physical Assessment:   Vitals:   07/06/22 1049 07/06/22 1055  BP: (!) 166/109 (!) 174/105  Pulse: 96 (!) 101  Weight: 277 lb 3.2 oz (125.7 kg)   Height: 5\' 5"   (1.651 m)   Body mass index is 46.13 kg/m.       Physical Examination:   General appearance: alert, well appearing, and in no distress  Psych: mood appropriate, normal affect  Skin: warm & dry   Cardiovascular: normal heart rate noted  Respiratory: normal respiratory effort, no distress  Abdomen: soft, non-tender   Pelvic: examination not indicated  Extremities: right arm with palpable device- no evidence of infection or migration  Chaperone: N/A    Assessment & Plan:  1) Contraceptive management -reviewed continuing with Nexplanon vs switching to alternative option -for now plan to continue with Nexplanon and monitor bleeding pattern -at times would consider additional OCP; however due to current BP that is not an option []  f/u in 68mos- if menses are tolerable, may cancel appt  2) Chronic HTN -continue with current meds -encouraged pt to check BP at home and if remains elevated follow up with PCP -pt to schedule appt for Jan with PCP  3) Obesity -encouraged pt to continue to work on 0mo management as this too may also influence her menses  Return in about 3 months (around 10/05/2022) for Nexplanon follow up .   [employer], DO Attending Obstetrician &  Gynecologist, Product/process development scientist for [employer], [place]

## 2022-07-08 ENCOUNTER — Encounter: Payer: Self-pay | Admitting: Nurse Practitioner

## 2022-07-08 ENCOUNTER — Other Ambulatory Visit: Payer: Self-pay | Admitting: Family Medicine

## 2022-07-08 MED ORDER — METFORMIN HCL ER 500 MG PO TB24: 1000.0000 mg | ORAL_TABLET | Freq: Every day | ORAL | 1 refills | Status: DC

## 2022-08-19 ENCOUNTER — Other Ambulatory Visit: Payer: Self-pay | Admitting: Family Medicine

## 2022-08-19 ENCOUNTER — Other Ambulatory Visit: Payer: Self-pay | Admitting: Nurse Practitioner

## 2022-09-29 ENCOUNTER — Encounter: Payer: Self-pay | Admitting: Family Medicine

## 2022-10-04 ENCOUNTER — Other Ambulatory Visit: Payer: Self-pay | Admitting: Family Medicine

## 2022-10-04 MED ORDER — TIRZEPATIDE 2.5 MG/0.5ML ~~LOC~~ SOAJ: 2.5000 mg | SUBCUTANEOUS | 0 refills | Status: DC

## 2022-11-03 ENCOUNTER — Other Ambulatory Visit: Payer: Self-pay | Admitting: Family Medicine

## 2022-11-11 MED ORDER — LEVOTHYROXINE SODIUM 25 MCG PO TABS: ORAL_TABLET | ORAL | 0 refills | Status: DC

## 2022-11-25 DIAGNOSIS — H5213 Myopia, bilateral: Secondary | ICD-10-CM | POA: Diagnosis not present

## 2023-02-13 ENCOUNTER — Other Ambulatory Visit: Payer: Self-pay | Admitting: Nurse Practitioner

## 2023-02-13 ENCOUNTER — Other Ambulatory Visit: Payer: Self-pay | Admitting: Family Medicine

## 2023-02-25 ENCOUNTER — Other Ambulatory Visit: Payer: Self-pay | Admitting: Family Medicine

## 2023-03-22 ENCOUNTER — Other Ambulatory Visit: Payer: Self-pay | Admitting: Nurse Practitioner

## 2023-03-23 ENCOUNTER — Other Ambulatory Visit: Payer: Self-pay | Admitting: Family Medicine

## 2023-03-28 ENCOUNTER — Other Ambulatory Visit: Payer: Self-pay | Admitting: Nurse Practitioner

## 2023-03-28 ENCOUNTER — Encounter: Payer: Self-pay | Admitting: Nurse Practitioner

## 2023-04-19 DIAGNOSIS — M5451 Vertebrogenic low back pain: Secondary | ICD-10-CM | POA: Diagnosis not present

## 2023-04-19 DIAGNOSIS — M7918 Myalgia, other site: Secondary | ICD-10-CM | POA: Diagnosis not present

## 2023-04-27 ENCOUNTER — Ambulatory Visit (INDEPENDENT_AMBULATORY_CARE_PROVIDER_SITE_OTHER): Payer: BC Managed Care – PPO | Admitting: Nurse Practitioner

## 2023-04-27 VITALS — BP 167/108 | HR 95 | Temp 98.7°F | Ht 64.37 in | Wt 308.0 lb

## 2023-04-27 DIAGNOSIS — Z01419 Encounter for gynecological examination (general) (routine) without abnormal findings: Secondary | ICD-10-CM

## 2023-04-27 DIAGNOSIS — Z130 Encounter for screening for diseases of the blood and blood-forming organs and certain disorders involving the immune mechanism: Secondary | ICD-10-CM

## 2023-04-27 DIAGNOSIS — E119 Type 2 diabetes mellitus without complications: Secondary | ICD-10-CM

## 2023-04-27 DIAGNOSIS — E1169 Type 2 diabetes mellitus with other specified complication: Secondary | ICD-10-CM

## 2023-04-27 DIAGNOSIS — E038 Other specified hypothyroidism: Secondary | ICD-10-CM | POA: Diagnosis not present

## 2023-04-27 DIAGNOSIS — Z124 Encounter for screening for malignant neoplasm of cervix: Secondary | ICD-10-CM | POA: Diagnosis not present

## 2023-04-27 DIAGNOSIS — Z23 Encounter for immunization: Secondary | ICD-10-CM | POA: Diagnosis not present

## 2023-04-27 DIAGNOSIS — Z01411 Encounter for gynecological examination (general) (routine) with abnormal findings: Secondary | ICD-10-CM

## 2023-04-27 DIAGNOSIS — G4733 Obstructive sleep apnea (adult) (pediatric): Secondary | ICD-10-CM

## 2023-04-27 DIAGNOSIS — Z113 Encounter for screening for infections with a predominantly sexual mode of transmission: Secondary | ICD-10-CM

## 2023-04-27 DIAGNOSIS — I10 Essential (primary) hypertension: Secondary | ICD-10-CM | POA: Diagnosis not present

## 2023-04-27 DIAGNOSIS — Z7985 Long-term (current) use of injectable non-insulin antidiabetic drugs: Secondary | ICD-10-CM

## 2023-04-27 DIAGNOSIS — E785 Hyperlipidemia, unspecified: Secondary | ICD-10-CM

## 2023-04-27 DIAGNOSIS — Z1151 Encounter for screening for human papillomavirus (HPV): Secondary | ICD-10-CM

## 2023-04-27 MED ORDER — AMLODIPINE BESYLATE 10 MG PO TABS: 10.0000 mg | ORAL_TABLET | Freq: Every day | ORAL | 1 refills | Status: DC

## 2023-04-27 MED ORDER — ENALAPRIL MALEATE 20 MG PO TABS
20.0000 mg | ORAL_TABLET | Freq: Every day | ORAL | 1 refills | Status: DC
Start: 2023-04-27 — End: 2023-09-27

## 2023-04-27 MED ORDER — TRULICITY 3 MG/0.5ML ~~LOC~~ SOAJ: 3.0000 mg | SUBCUTANEOUS | 2 refills | Status: DC

## 2023-04-27 MED ORDER — METFORMIN HCL ER 500 MG PO TB24: ORAL_TABLET | ORAL | 1 refills | Status: DC

## 2023-04-27 NOTE — Progress Notes (Signed)
Subjective:    Patient ID: Brianna [name], female    DOB: June 11, 1991, 32 y.o.   MRN: [id number]  HPI The patient comes in today for a wellness visit. She has gained approximately 40 pounds over the last year. She states her poor eating habits and lack of physical activity have contributed to this weight gain. She lives at home with her parents and 7 month old son. Works 30-40 hours per week at [employer]. She reports a level of stress that is manageable. She has not been wearing her CPAP at night, no longer has the machine. She does not check her blood sugars or blood pressures outside of the office.   A review of their health history was completed.  A review of medications was also completed.  Any needed refills; Everything  Eating habits: She reports consuming fast food, processed foods with high sugar and high fat content. She drinks soda and sweet tea.   Falls/ MVA accidents in past few months: No  Regular exercise: denies any physical exercise.   Specialist pt sees on regular basis: No.   Preventative health issues were discussed. She currently is sexually active, no new partners over the last year. Has Nexplanon implant in right arm for contraceptive. She reports regular periods, lasting 5-7 days, with moderate to light bleeding.   Additional concerns: Nerve pain in right leg. She has been seen by Dr. Shelle Iron at [facility] for back and nerve pain. She received a steroid injection in her back approximately 2 weeks ago. Firm, mobile nodule measuring 1cm on right breast, 6 o'clock position. Breast US performed in 2021, determined to be a sebaceous cyst.   Past Medical History:  Diagnosis Date   Diabetes mellitus without complication (HCC)    Hypertension    Obesity    Pain    back pain, dx. herniated nucleus polpusos   Thyroid disease     Social History   Tobacco Use   Smoking status: Never   Smokeless tobacco: Never  Vaping Use   Vaping status: Never Used  Substance  Use Topics   Alcohol use: Not Currently   Drug use: No    Review of Systems  Constitutional:  Negative for fatigue and fever.  HENT:  Negative for sore throat and trouble swallowing.   Respiratory:  Negative for cough, chest tightness, shortness of breath and wheezing.   Cardiovascular:  Negative for chest pain.  Gastrointestinal:  Negative for abdominal distention, abdominal pain, constipation, diarrhea, nausea and vomiting.  Genitourinary:  Negative for difficulty urinating, dysuria, enuresis, frequency, genital sores, hematuria, menstrual problem, pelvic pain, urgency, vaginal bleeding, vaginal discharge and vaginal pain.  Psychiatric/Behavioral:  Negative for suicidal ideas.       04/27/2023    8:39 AM  Depression screen PHQ 2/9  Decreased Interest 0  Down, Depressed, Hopeless 0  PHQ - 2 Score 0  Altered sleeping 1  Tired, decreased energy 1  Change in appetite 1  Feeling bad or failure about yourself  0  Trouble concentrating 0  Moving slowly or fidgety/restless 0  Suicidal thoughts 0  PHQ-9 Score 3      04/27/2023    8:39 AM 05/20/2022    2:41 PM 12/16/2021    3:24 PM 09/13/2021   10:30 AM  GAD 7 : Generalized Anxiety Score  Nervous, Anxious, on Edge 0 0 0 0  Control/stop worrying 0 0 0 0  Worry too much - different things 0 0 0 0  Trouble relaxing 0  0 0 0  Restless 0 0 0 0  Easily annoyed or irritable 1 0 0 1  Afraid - awful might happen 0 0 0 0  Total GAD 7 Score 1 0 0 1  Anxiety Difficulty  Not difficult at all           Objective:   Physical Exam Vitals and nursing note reviewed. Exam conducted with a chaperone present.  Constitutional:      General: She is not in acute distress.    Appearance: Normal appearance. She is well-developed. She is not ill-appearing.  Neck:     Thyroid: No thyromegaly.     Trachea: No tracheal deviation.     Comments: Thyroid non tender to palpation. No mass or goiter noted.  Cardiovascular:     Rate and Rhythm: Normal  rate and regular rhythm.     Heart sounds: Normal heart sounds. No murmur heard. Pulmonary:     Effort: Pulmonary effort is normal.     Breath sounds: Normal breath sounds.  Chest:  Breasts:    Right: Mass present. No swelling, inverted nipple, nipple discharge, skin change or tenderness.     Left: No swelling, inverted nipple, mass, nipple discharge, skin change or tenderness.     Comments: 1 cm firm, mobile superficial nodule on right breast at 6 o'clock; note this is a chronic nodule according to patient with no change in size Abdominal:     General: There is no distension.     Palpations: Abdomen is soft. There is no mass.     Tenderness: There is no abdominal tenderness.  Genitourinary:    General: Normal vulva.     Exam position: Lithotomy position.     Pubic Area: No rash.      Vagina: Normal. No vaginal discharge, erythema or bleeding.     Cervix: Normal. No discharge, lesion or erythema.     Comments: Bimanual exam: no tenderness or obvious masses. Exam limited due to abdominal girth.  Musculoskeletal:     Cervical back: Normal range of motion and neck supple.  Lymphadenopathy:     Cervical: No cervical adenopathy.     Upper Body:     Right upper body: No supraclavicular, axillary or pectoral adenopathy.     Left upper body: No supraclavicular, axillary or pectoral adenopathy.  Skin:    General: Skin is warm and dry.     Findings: No rash.     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  Psychiatric:        Mood and Affect: Mood normal.        Behavior: Behavior normal.        Thought Content: Thought content normal.        Judgment: Judgment normal.    Vitals:   04/27/23 0835 04/27/23 0924  BP: (!) 165/108 (!) 167/108  Pulse: 95   Temp: 98.7 F (37.1 C)   SpO2: 98%     Diabetic Foot Exam - Simple   Simple Foot Form Diabetic Foot exam was performed with the  following findings: Yes 04/27/2023  9:36 AM  Visual Inspection No deformities, no ulcerations, no other skin breakdown bilaterally: Yes Sensation Testing Intact to touch and monofilament testing bilaterally: Yes Pulse Check See comments: Yes Comments DP pulses present bilaterally. Toes warm and normal cap refill.          Assessment & Plan:  1. Well woman exam with routine gynecological exam - Flu vaccine trivalent PF, 6mos and older(Flulaval,Afluria,Fluarix,Fluzone) - CBC with Differential/Platelet - Comprehensive metabolic panel - Hemoglobin A1c - Lipid panel - TSH - Microalbumin/Creatinine Ratio, Urine - IGP,CtNgTv,Apt HPV -Recommended getting an eye exam this year.  2. Screening for HPV (human papillomavirus) - IGP,CtNgTv,Apt HPV  3. Screening for cervical cancer - IGP,CtNgTv,Apt HPV  4. Immunization due - Flu vaccine trivalent PF, 6mos and older(Flulaval,Afluria,Fluarix,Fluzone)  5. Hypertension, unspecified type - Comprehensive metabolic panel -Recommend monitoring pressures at home   6. Hyperlipidemia associated with type 2 diabetes mellitus (HCC) - Lipid panel - Increase activity level - Incorporate more whole foods, meal prep at home  7. Other specified hypothyroidism - TSH  8. Type 2 diabetes mellitus without complication, without long-term current use of insulin (HCC) - Comprehensive metabolic panel - Hemoglobin A1c - Microalbumin/Creatinine Ratio, Urine -Increase Trulicity to 3 mg -Monitor blood glucose at home, keep logs of BG and food diary   9. Screening for deficiency anemia - CBC with Differential/Platelet  10. Screen for STD (sexually transmitted disease) - IGP,CtNgTv,Apt HPV  11. Essential hypertension - enalapril (VASOTEC) 20 MG tablet; Take 1 tablet (20 mg total) by mouth daily.  Dispense: 90 tablet; Refill: 1  12. Obstructive sleep apnea -Not wearing CPAP at night, no longer has machine. States she did not like it and could not  sleep well while wearing it. Declines new order for CPAP.   Meds ordered this encounter  Medications   amLODipine (NORVASC) 10 MG tablet    Sig: Take 1 tablet (10 mg total) by mouth daily.    Dispense:  90 tablet    Refill:  1    Order Specific Question:   Supervising Provider    Answer:   Lilyan Punt A [[id number]]   enalapril (VASOTEC) 20 MG tablet    Sig: Take 1 tablet (20 mg total) by mouth daily.    Dispense:  90 tablet    Refill:  1    Order Specific Question:   Supervising Provider    Answer:   Lilyan Punt A [[id number]]   metFORMIN (GLUCOPHAGE-XR) 500 MG 24 hr tablet    Sig: TAKE 2 TABLETS BY MOUTH EVERY DAY WITH BREAKFAST    Dispense:  180 tablet    Refill:  1    Order Specific Question:   Supervising Provider    Answer:   Lilyan Punt A [[id number]]   Dulaglutide (TRULICITY) 3 MG/0.5ML SOPN    Sig: Inject 3 mg into the skin once a week.    Dispense:  2 mL    Refill:  2    Order Specific Question:   Supervising Provider    Answer:   Lilyan Punt A [[id number]]   Return in about 3 months (around 07/28/2023).

## 2023-04-28 ENCOUNTER — Other Ambulatory Visit: Payer: Self-pay | Admitting: Nurse Practitioner

## 2023-04-28 ENCOUNTER — Encounter: Payer: Self-pay | Admitting: Nurse Practitioner

## 2023-04-28 DIAGNOSIS — E038 Other specified hypothyroidism: Secondary | ICD-10-CM

## 2023-04-28 LAB — COMPREHENSIVE METABOLIC PANEL
ALT: 11 [IU]/L (ref 0–32)
AST: 11 [IU]/L (ref 0–40)
Albumin: 4.4 g/dL (ref 3.9–4.9)
Alkaline Phosphatase: 71 [IU]/L (ref 44–121)
BUN/Creatinine Ratio: 25 — ABNORMAL HIGH (ref 9–23)
BUN: 13 mg/dL (ref 6–20)
Bilirubin Total: 0.2 mg/dL (ref 0.0–1.2)
CO2: 22 mmol/L (ref 20–29)
Calcium: 9.5 mg/dL (ref 8.7–10.2)
Chloride: 101 mmol/L (ref 96–106)
Creatinine, Ser: 0.53 mg/dL — ABNORMAL LOW (ref 0.57–1.00)
Globulin, Total: 3.3 g/dL (ref 1.5–4.5)
Glucose: 220 mg/dL — ABNORMAL HIGH (ref 70–99)
Potassium: 4 mmol/L (ref 3.5–5.2)
Sodium: 136 mmol/L (ref 134–144)
Total Protein: 7.7 g/dL (ref 6.0–8.5)
eGFR: 127 mL/min/{1.73_m2} (ref >59)

## 2023-04-28 LAB — LIPID PANEL
Chol/HDL Ratio: 3.2 {ratio} (ref 0.0–4.4)
Cholesterol, Total: 184 mg/dL (ref 100–199)
HDL: 58 mg/dL (ref >39)
LDL Chol Calc (NIH): 117 mg/dL — ABNORMAL HIGH (ref 0–99)
Triglycerides: 43 mg/dL (ref 0–149)
VLDL Cholesterol Cal: 9 mg/dL (ref 5–40)

## 2023-04-28 LAB — TSH: TSH: 4.54 u[IU]/mL — ABNORMAL HIGH (ref 0.450–4.500)

## 2023-04-28 LAB — CBC WITH DIFFERENTIAL/PLATELET
Basophils Absolute: 0 10*3/uL (ref 0.0–0.2)
Basos: 1 %
EOS (ABSOLUTE): 0.2 10*3/uL (ref 0.0–0.4)
Eos: 3 %
Hematocrit: 36.9 % (ref 34.0–46.6)
Hemoglobin: 11.4 g/dL (ref 11.1–15.9)
Immature Grans (Abs): 0 10*3/uL (ref 0.0–0.1)
Immature Granulocytes: 0 %
Lymphocytes Absolute: 1.5 10*3/uL (ref 0.7–3.1)
Lymphs: 28 %
MCH: 26.9 pg (ref 26.6–33.0)
MCHC: 30.9 g/dL — ABNORMAL LOW (ref 31.5–35.7)
MCV: 87 fL (ref 79–97)
Monocytes Absolute: 0.4 10*3/uL (ref 0.1–0.9)
Monocytes: 7 %
Neutrophils Absolute: 3.3 10*3/uL (ref 1.4–7.0)
Neutrophils: 61 %
Platelets: 252 10*3/uL (ref 150–450)
RBC: 4.24 x10E6/uL (ref 3.77–5.28)
RDW: 13.5 % (ref 11.7–15.4)
WBC: 5.3 10*3/uL (ref 3.4–10.8)

## 2023-04-28 LAB — MICROALBUMIN / CREATININE URINE RATIO
Creatinine, Urine: 65 mg/dL
Microalb/Creat Ratio: 95 mg/g{creat} — ABNORMAL HIGH (ref 0–29)
Microalbumin, Urine: 62 ug/mL

## 2023-04-28 LAB — HEMOGLOBIN A1C
Est. average glucose Bld gHb Est-mCnc: 203 mg/dL
Hgb A1c MFr Bld: 8.7 % — ABNORMAL HIGH (ref 4.8–5.6)

## 2023-04-28 MED ORDER — LEVOTHYROXINE SODIUM 75 MCG PO TABS: 75.0000 ug | ORAL_TABLET | Freq: Every day | ORAL | 0 refills | Status: DC

## 2023-04-28 NOTE — Progress Notes (Signed)
Subjective:    Patient ID: Brianna [name], female    DOB: 1991/03/26, 32 y.o.   MRN: [id number]  HPI    Review of Systems     Objective:   Physical Exam        Assessment & Plan:

## 2023-05-02 LAB — IGP,CTNGTV,APT HPV
Chlamydia, Nuc. Acid Amp: NEGATIVE
Gonococcus, Nuc. Acid Amp: NEGATIVE
HPV Aptima: POSITIVE — AB
Trich vag by NAA: NEGATIVE

## 2023-05-03 ENCOUNTER — Encounter: Payer: Self-pay | Admitting: Nurse Practitioner

## 2023-05-08 DIAGNOSIS — M5489 Other dorsalgia: Secondary | ICD-10-CM | POA: Insufficient documentation

## 2023-05-08 DIAGNOSIS — M545 Low back pain, unspecified: Secondary | ICD-10-CM | POA: Insufficient documentation

## 2023-05-09 DIAGNOSIS — M545 Low back pain, unspecified: Secondary | ICD-10-CM | POA: Diagnosis not present

## 2023-05-16 DIAGNOSIS — M5451 Vertebrogenic low back pain: Secondary | ICD-10-CM | POA: Diagnosis not present

## 2023-05-16 DIAGNOSIS — M791 Myalgia, unspecified site: Secondary | ICD-10-CM | POA: Diagnosis not present

## 2023-06-14 DIAGNOSIS — M5459 Other low back pain: Secondary | ICD-10-CM | POA: Diagnosis not present

## 2023-06-14 DIAGNOSIS — M5451 Vertebrogenic low back pain: Secondary | ICD-10-CM | POA: Diagnosis not present

## 2023-06-14 DIAGNOSIS — M791 Myalgia, unspecified site: Secondary | ICD-10-CM | POA: Diagnosis not present

## 2023-07-26 ENCOUNTER — Other Ambulatory Visit: Payer: Self-pay | Admitting: Nurse Practitioner

## 2023-07-28 ENCOUNTER — Ambulatory Visit: Payer: BC Managed Care – PPO | Admitting: Family Medicine

## 2023-08-08 ENCOUNTER — Emergency Department (HOSPITAL_COMMUNITY): Payer: BC Managed Care – PPO

## 2023-08-08 ENCOUNTER — Emergency Department (HOSPITAL_COMMUNITY)
Admission: EM | Admit: 2023-08-08 | Discharge: 2023-08-08 | Disposition: A | Discharge status: Home / Self Care | Payer: BC Managed Care – PPO | Attending: Emergency Medicine | Admitting: Emergency Medicine

## 2023-08-08 ENCOUNTER — Other Ambulatory Visit: Payer: Self-pay

## 2023-08-08 DIAGNOSIS — E119 Type 2 diabetes mellitus without complications: Secondary | ICD-10-CM | POA: Insufficient documentation

## 2023-08-08 DIAGNOSIS — M5106 Intervertebral disc disorders with myelopathy, lumbar region: Secondary | ICD-10-CM | POA: Diagnosis not present

## 2023-08-08 DIAGNOSIS — M5441 Lumbago with sciatica, right side: Secondary | ICD-10-CM | POA: Diagnosis not present

## 2023-08-08 DIAGNOSIS — I10 Essential (primary) hypertension: Secondary | ICD-10-CM | POA: Insufficient documentation

## 2023-08-08 DIAGNOSIS — M4804 Spinal stenosis, thoracic region: Secondary | ICD-10-CM | POA: Diagnosis not present

## 2023-08-08 DIAGNOSIS — M545 Low back pain, unspecified: Secondary | ICD-10-CM | POA: Diagnosis not present

## 2023-08-08 DIAGNOSIS — M5127 Other intervertebral disc displacement, lumbosacral region: Secondary | ICD-10-CM | POA: Diagnosis not present

## 2023-08-08 DIAGNOSIS — M48061 Spinal stenosis, lumbar region without neurogenic claudication: Secondary | ICD-10-CM | POA: Diagnosis not present

## 2023-08-08 LAB — CBC
HCT: 34 % — ABNORMAL LOW (ref 36.0–46.0)
Hemoglobin: 11 g/dL — ABNORMAL LOW (ref 12.0–15.0)
MCH: 28 pg (ref 26.0–34.0)
MCHC: 32.4 g/dL (ref 30.0–36.0)
MCV: 86.5 fL (ref 80.0–100.0)
Platelets: 243 10*3/uL (ref 150–400)
RBC: 3.93 MIL/uL (ref 3.87–5.11)
RDW: 13.1 % (ref 11.5–15.5)
WBC: 4.8 10*3/uL (ref 4.0–10.5)
nRBC: 0 % (ref 0.0–0.2)

## 2023-08-08 LAB — BASIC METABOLIC PANEL
Anion gap: 6 (ref 5–15)
BUN: 10 mg/dL (ref 6–20)
CO2: 22 mmol/L (ref 22–32)
Calcium: 8.7 mg/dL — ABNORMAL LOW (ref 8.9–10.3)
Chloride: 104 mmol/L (ref 98–111)
Creatinine, Ser: 0.39 mg/dL — ABNORMAL LOW (ref 0.44–1.00)
GFR, Estimated: >60 mL/min (ref >60)
Glucose, Bld: 289 mg/dL — ABNORMAL HIGH (ref 70–99)
Potassium: 3.7 mmol/L (ref 3.5–5.1)
Sodium: 132 mmol/L — ABNORMAL LOW (ref 135–145)

## 2023-08-08 MED ORDER — OXYCODONE-ACETAMINOPHEN 5-325 MG PO TABS: 1.0000 | ORAL_TABLET | Freq: Four times a day (QID) | ORAL | 0 refills | Status: DC | PRN

## 2023-08-08 MED ORDER — HYDRALAZINE HCL 25 MG PO TABS
25.0000 mg | ORAL_TABLET | Freq: Once | ORAL | Status: AC
  Administered 2023-08-08: 25 mg via ORAL
  Filled 2023-08-08: qty 1

## 2023-08-08 MED ORDER — HYDROMORPHONE HCL 1 MG/ML IJ SOLN
1.0000 mg | Freq: Once | INTRAMUSCULAR | Status: AC
  Administered 2023-08-08: 1 mg via INTRAVENOUS
  Filled 2023-08-08: qty 1

## 2023-08-08 MED ORDER — MORPHINE SULFATE (PF) 4 MG/ML IV SOLN
4.0000 mg | Freq: Once | INTRAVENOUS | Status: AC
  Administered 2023-08-08: 4 mg via INTRAVENOUS
  Filled 2023-08-08: qty 1

## 2023-08-08 NOTE — ED Triage Notes (Signed)
 Pt c/o right hip pain that radiates down her leg. Reports hx of sciatica. Ambulatory in triage.

## 2023-08-08 NOTE — ED Provider Notes (Signed)
 [facility]-EMERGENCY DEPT [facility] Emergency Department Provider Note MRN:  [id number]  Arrival date & time: 08/08/23     Chief Complaint   Leg Pain   History of Present Illness   Brianna [name] is a 33 y.o. year-old female with a history of hypertension, diabetes presenting to the ED with chief complaint of leg pain.  Pain to the right lower back with radiation down the right leg.  Has had flares of this pain multiple times but this is the worst it has been since her back surgery 8 years ago.  Has new numbness to her toes on the right foot.  No bowel or bladder dysfunction.  Review of Systems  A thorough review of systems was obtained and all systems are negative except as noted in the HPI and PMH.   Patient's Health History    Past Medical History:  Diagnosis Date   Diabetes mellitus without complication (HCC)    Hypertension    Obesity    Pain    back pain, dx. herniated nucleus polpusos   Thyroid  disease     Past Surgical History:  Procedure Laterality Date   LUMBAR LAMINECTOMY/DECOMPRESSION MICRODISCECTOMY N/A 09/03/2014   Procedure: MICROLUMBAR DECOMPRESSION LUMBAR FOUR TO FIVE, LUMBAR FIVE TO SACRAL ONE;  Surgeon: Reyes JAYSON Billing, MD;  Location: [facility];  Service: Orthopedics;  Laterality: N/A;   WISDOM TOOTH EXTRACTION      Family History  Problem Relation Age of Onset   Diabetes Mother    Hypertension Father    Hypertension Brother    Diabetes Maternal Grandmother    Hypertension Maternal Grandmother    Cancer Maternal Grandmother    Diabetes Maternal Grandfather    Hypertension Maternal Grandfather    Diabetes Paternal Grandmother    Hypertension Paternal Grandmother    Colon cancer Paternal Grandmother    Diabetes Paternal Grandfather    Hypertension Paternal Grandfather     Social History   Socioeconomic History   Marital status: Single    Spouse name: Not on file   Number of children: Not on file   Years of education: Not on file   Highest  education level: Not on file  Occupational History   Not on file  Tobacco Use   Smoking status: Never   Smokeless tobacco: Never  Vaping Use   Vaping status: Never Used  Substance and Sexual Activity   Alcohol use: Not Currently   Drug use: No   Sexual activity: Yes    Birth control/protection: Implant  Other Topics Concern   Not on file  Social History Narrative   Not on file   Social Drivers of Health   Financial Resource Strain: Low Risk  (12/16/2021)   Overall Financial Resource Strain (CARDIA)    Difficulty of Paying Living Expenses: Not hard at all  Food Insecurity: No Food Insecurity (02/16/2022)   [facility] to [facility] prior to 09/24/2022., [facility]   Hunger Vital Sign    Worried About Running Out of Food in the Last Year: Never true    Ran Out of Food in the Last Year: Never true  Transportation Needs: No Transportation Needs (12/16/2021)   PRAPARE - Administrator, Civil Service (Medical): No    Lack of Transportation (Non-Medical): No  Physical Activity: Insufficiently Active (12/16/2021)   Exercise Vital Sign    Days of Exercise per Week: 1 day  Minutes of Exercise per Session: 10 min  Stress: No Stress Concern Present (12/16/2021)   [employer] of Occupational Health - Occupational Stress Questionnaire    Feeling of Stress : Only a little  Social Connections: Moderately Isolated (12/16/2021)   Social Connection and Isolation Panel [NHANES]    Frequency of Communication with Friends and Family: More than three times a week    Frequency of Social Gatherings with Friends and Family: Twice a week    Attends Religious Services: 1 to 4 times per year    Active Member of [employer] or Organizations: No    Attends Banker Meetings: Never    Marital Status: Never married  Intimate Partner Violence: Not At Risk (02/16/2022)   [facility]  from [facility] visits prior to 09/24/2022., [facility] visits prior to 09/24/2022.   Humiliation, Afraid, Rape, and Kick questionnaire    Fear of Current or Ex-Partner: No    Emotionally Abused: No    Physically Abused: No    Sexually Abused: No     Physical Exam   Vitals:   08/08/23 0415 08/08/23 0531  BP: (!) 174/114 (!) 192/134  Pulse: 99 99  Resp:  16  Temp:    SpO2: 95% 97%    CONSTITUTIONAL: Well-appearing, NAD NEURO/PSYCH:  Alert and oriented x 3, no focal deficits EYES:  eyes equal and reactive ENT/NECK:  no LAD, no JVD CARDIO: Regular rate, well-perfused, normal S1 and S2 PULM:  CTAB no wheezing or rhonchi GI/GU:  non-distended, non-tender MSK/SPINE:  No gross deformities, no edema SKIN:  no rash, atraumatic   *Additional and/or pertinent findings included in MDM below  Diagnostic and Interventional Summary    EKG Interpretation Date/Time:    Ventricular Rate:    PR Interval:    QRS Duration:    QT Interval:    QTC Calculation:   R Axis:      Text Interpretation:         Labs Reviewed  CBC - Abnormal; Notable for the following components:      Result Value   Hemoglobin 11.0 (*)    HCT 34.0 (*)    All other components within normal limits  BASIC METABOLIC PANEL - Abnormal; Notable for the following components:   Sodium 132 (*)    Glucose, Bld 289 (*)    Creatinine, Ser 0.39 (*)    Calcium 8.7 (*)    All other components within normal limits    MR LUMBAR SPINE WO CONTRAST    (Results Pending)    Medications  HYDROmorphone  (DILAUDID ) injection 1 mg (1 mg Intravenous Given 08/08/23 0142)  HYDROmorphone  (DILAUDID ) injection 1 mg (1 mg Intravenous Given 08/08/23 0338)  hydrALAZINE  (APRESOLINE ) tablet 25 mg (25 mg Oral Given by Other 08/08/23 0549)     Procedures  /  Critical Care Procedures  ED Course and Medical Decision Making  Initial Impression and Ddx Sciatica with decreased sensation to the toes  of the right foot, new today.  History of degenerative disc with surgery in the past, will need MRI.  Past medical/surgical history that increases complexity of ED encounter: History of back surgery  Interpretation of Diagnostics I personally reviewed the laboratory assessment and my interpretation is as follows: No significant blood count or electrolyte disturbance    Patient Reassessment and Ultimate Disposition/Management     Awaiting MRI.  Signed out to oncoming provider at shift change.  Patient management required discussion with the following  services or consulting groups:  None  Complexity of Problems Addressed Acute illness or injury that poses threat of life of bodily function  Additional Data Reviewed and Analyzed Further history obtained from: Further history from spouse/family member  Additional Factors Impacting ED Encounter Risk Consideration of hospitalization  Ozell HERO. Theadore, MD [facility] Emergency Medicine [facility] [email or web address]  Final Clinical Impressions(s) / ED Diagnoses     ICD-10-CM   1. Acute midline low back pain with right-sided sciatica  M54.41       ED Discharge Orders     None        Discharge Instructions Discussed with and Provided to Patient:   Discharge Instructions   None      Theadore Ozell HERO, MD 08/08/23 [phone]

## 2023-08-08 NOTE — ED Notes (Signed)
 Patient transported to MRI

## 2023-08-08 NOTE — ED Provider Notes (Signed)
 Pt signed out by Dr. Theadore pending MRI.    MRI films reviewed by me.  I agree with the radiologist.  T11-12: Right foraminal protrusion with endplate and facet spurring  causing moderate right foraminal stenosis.    T12- L1: Unremarkable.    L1-L2: Unremarkable.    L2-L3: Unremarkable.    L3-L4: Degenerative facet spurring and mild disc bulging.    L4-L5: Large rightward eccentric disc extrusion causing severe  thecal sac stenosis. Mild underlying disc bulging and asymmetric  right facet spurring. The foramina are patent    L5-S1:Disc protrusion with buttressing osteophytes. Degenerative  facet spurring greater on the right with signs of prior laminotomy  on the right.    IMPRESSION:  1. 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  2. T11-12 moderate right foraminal narrowing mainly from disc  protrusion.  3. Equivocal T2 hyperintensity in the lower thoracic cord, but not  confirmed on sagittal images. Thoracic MRI follow-up would be  recommended if there is myelopathy.    Pt had surgery by Dr. Duwayne ([facility]) in the past and requests him.  Pt d/w Dr. Burnetta who plans to get her close f/u with a nerve injection.  She is to keep her appt with Dr. Duwayne as scheduled.  She is to go to [facility] urgent care or ED if sx worsen.     Dean Clarity, MD 08/08/23 [phone]

## 2023-08-08 NOTE — ED Notes (Signed)
 MD made aware of blood pressure readings

## 2023-08-09 DIAGNOSIS — M5416 Radiculopathy, lumbar region: Secondary | ICD-10-CM | POA: Diagnosis not present

## 2023-08-14 ENCOUNTER — Telehealth: Payer: Self-pay

## 2023-08-14 NOTE — Progress Notes (Signed)
Transition Care Management Unsuccessful Follow-up Telephone Call  Date of discharge and from where:  08/08/2023 [facility]  Attempts:  1st Attempt  Reason for unsuccessful TCM follow-up call:  No answer/busy  Khup Sapia [facility]  [facility] Guide Direct Dial: [phone]  Fax: [phone] Website: [email or web address]

## 2023-08-15 ENCOUNTER — Telehealth: Payer: Self-pay

## 2023-08-15 NOTE — Progress Notes (Signed)
Transition Care Management Follow-up Telephone Call Date of discharge and from where: 08/08/2023 [facility] How have you been since you were released from the hospital? Patient stated that her pain level is about the same. Any questions or concerns? No  Items Reviewed: Did the pt receive and understand the discharge instructions provided? Yes  Medications obtained and verified? Yes  Other? No  Any new allergies since your discharge? No  Dietary orders reviewed? Yes Do you have support at home? Yes   Follow up appointments reviewed:  PCP Hospital f/u appt confirmed? Yes  Scheduled to see Brianna [name]. Brianna Jarvis, NP on 08/29/2023 @ [facility]. Specialist Hospital f/u appt confirmed? Yes  Scheduled to see  on 08/22/2023 @ [place] [facility]. Are transportation arrangements needed? No  If their condition worsens, is the pt aware to call PCP or go to the Emergency Dept.? Yes Was the patient provided with contact information for the PCP's office or ED? Yes Was to pt encouraged to call back with questions or concerns? Yes   Brianna [name] [facility]  [facility] Guide Direct Dial: [phone]  Fax: [phone] Website: [email or web address]

## 2023-08-17 DIAGNOSIS — R7309 Other abnormal glucose: Secondary | ICD-10-CM | POA: Diagnosis not present

## 2023-08-28 DIAGNOSIS — M5451 Vertebrogenic low back pain: Secondary | ICD-10-CM | POA: Diagnosis not present

## 2023-08-29 ENCOUNTER — Ambulatory Visit: Payer: BC Managed Care – PPO | Admitting: Nurse Practitioner

## 2023-08-29 ENCOUNTER — Encounter: Payer: Self-pay | Admitting: Nurse Practitioner

## 2023-08-29 VITALS — BP 153/98 | HR 100 | Temp 97.5°F | Ht 64.0 in | Wt 303.0 lb

## 2023-08-29 DIAGNOSIS — E1169 Type 2 diabetes mellitus with other specified complication: Secondary | ICD-10-CM | POA: Diagnosis not present

## 2023-08-29 DIAGNOSIS — E038 Other specified hypothyroidism: Secondary | ICD-10-CM

## 2023-08-29 DIAGNOSIS — I10 Essential (primary) hypertension: Secondary | ICD-10-CM | POA: Diagnosis not present

## 2023-08-29 DIAGNOSIS — E119 Type 2 diabetes mellitus without complications: Secondary | ICD-10-CM | POA: Diagnosis not present

## 2023-08-29 NOTE — Progress Notes (Signed)
 Subjective:    Patient ID: Brianna [name], female    DOB: 08-08-1990, 33 y.o.   MRN: [id number]  HPI Brianna [name] presents today for a follow-up after her last physical. She did take the Trulicity , and stopped it back in November. Reports diarrhea, nausea and vomiting the day after taking medication. The rest of the week after she takes her medication, she is okay. Blood sugars have been up into the 400's while off of the medication. Started taking the medication again two weeks ago, and said blood sugars have been 120's-130's. Had labs done through [facility] on 1/23 and reports A1c was 9.8. She says she has been limiting sodas and sweets out of diet. Not currently exercising due to back pain. Has not been monitoring blood pressures at home and has a monitor at home. Has a history of sleep apnea, but does not wear CPAP machine. Has a pre-op appointment tomorrow with [facility].   Review of Systems  Constitutional:  Negative for fatigue, fever and unexpected weight change.  HENT:  Negative for sore throat and trouble swallowing.   Respiratory:  Negative for cough, chest tightness, shortness of breath and wheezing.   Cardiovascular:  Negative for chest pain.  Gastrointestinal:  Positive for constipation. Negative for diarrhea, nausea and vomiting.  Genitourinary:  Negative for menstrual problem.  Musculoskeletal:  Positive for back pain.      Objective:   Physical Exam Vitals and nursing note reviewed.  Constitutional:      General: She is not in acute distress.    Appearance: Normal appearance. She is obese. She is not ill-appearing.  Neck:     Comments: Thyroid  non tender to palpation. No mass or goiter noted.  Cardiovascular:     Rate and Rhythm: Normal rate and regular rhythm.     Heart sounds: S1 normal and S2 normal. No murmur heard. Pulmonary:     Effort: Pulmonary effort is normal. No respiratory distress.     Breath sounds: No wheezing.  Neurological:     Mental Status: She is  alert.  Psychiatric:        Mood and Affect: Mood normal.        Behavior: Behavior normal.        Thought Content: Thought content normal.        Judgment: Judgment normal.    Vitals:   08/29/23 0819 08/29/23 0920  BP: (!) 140/76 (!) 153/98  Pulse: 100   Temp: (!) 97.5 F (36.4 C)   Height: 5' 4 (1.626 m)   Weight: (!) 303 lb (137.4 kg)   SpO2: 99%   BMI (Calculated): 51.98         Assessment & Plan:  1. Type 2 diabetes mellitus without complication, without long-term current use of insulin  (HCC) (Primary)  - Hepatic function panel - Microalbumin/Creatinine Ratio, Urine -Advised patient to monitor blood sugars at home and during recovery from surgery. -Applied Dexcom 7  monitor to arm, and patient downloaded app on phone. Report readings to her surgical team. -Educated patient about avoiding simple carbs, sugar and sodas. Advised her to intake more whole foods.   2. Other specified hypothyroidism - TSH  3. Hypertension, unspecified type - Hepatic function panel  -Advised patient to monitor blood pressures at home with at home monitor and keep a log.   Return for Follow up after surgery.   I have seen and examined this patient alongside the NP student. I have reviewed and verified the student note and  agree with the assessment and plan.  Elveria Quarry, FNP

## 2023-08-29 NOTE — Patient Instructions (Addendum)
Ozempic or Monjauro  Plan: Monitor blood sugars through Dexcom and checking blood pressure at home Complete lab work today

## 2023-08-30 ENCOUNTER — Encounter: Payer: Self-pay | Admitting: Nurse Practitioner

## 2023-08-30 DIAGNOSIS — M5451 Vertebrogenic low back pain: Secondary | ICD-10-CM | POA: Diagnosis not present

## 2023-08-30 DIAGNOSIS — M545 Low back pain, unspecified: Secondary | ICD-10-CM | POA: Insufficient documentation

## 2023-08-30 LAB — MICROALBUMIN / CREATININE URINE RATIO
Creatinine, Urine: 36.7 mg/dL
Microalb/Creat Ratio: 34 mg/g{creat} — ABNORMAL HIGH (ref 0–29)
Microalbumin, Urine: 12.3 ug/mL

## 2023-08-30 LAB — HEPATIC FUNCTION PANEL
ALT: 21 [IU]/L (ref 0–32)
AST: 17 [IU]/L (ref 0–40)
Albumin: 4.5 g/dL (ref 3.9–4.9)
Alkaline Phosphatase: 77 [IU]/L (ref 44–121)
Bilirubin Total: 0.3 mg/dL (ref 0.0–1.2)
Bilirubin, Direct: 0.12 mg/dL (ref 0.00–0.40)
Total Protein: 7.7 g/dL (ref 6.0–8.5)

## 2023-08-30 LAB — TSH: TSH: 1.74 u[IU]/mL (ref 0.450–4.500)

## 2023-08-31 ENCOUNTER — Encounter: Payer: Self-pay | Admitting: Nurse Practitioner

## 2023-09-01 ENCOUNTER — Encounter: Payer: Self-pay | Admitting: Nurse Practitioner

## 2023-09-07 ENCOUNTER — Telehealth: Payer: Self-pay | Admitting: *Deleted

## 2023-09-07 DIAGNOSIS — E119 Type 2 diabetes mellitus without complications: Secondary | ICD-10-CM

## 2023-09-07 NOTE — Telephone Encounter (Signed)
Patient was identified as falling into the [place] Measure - Diabetes.   Patient was: Referred to pharmacy for chronic disease management.    Left message to return call with patient to notify we put in a referral to meet with clinical pharmacist concerning diabetes

## 2023-09-11 ENCOUNTER — Ambulatory Visit (HOSPITAL_COMMUNITY): Payer: Self-pay | Admitting: Orthopedic Surgery

## 2023-09-11 ENCOUNTER — Other Ambulatory Visit: Payer: Self-pay | Admitting: Orthopedic Surgery

## 2023-09-12 ENCOUNTER — Encounter: Payer: Self-pay | Admitting: Nurse Practitioner

## 2023-09-20 ENCOUNTER — Encounter: Payer: Self-pay | Admitting: Obstetrics & Gynecology

## 2023-09-25 DIAGNOSIS — M5416 Radiculopathy, lumbar region: Secondary | ICD-10-CM | POA: Diagnosis not present

## 2023-09-26 ENCOUNTER — Telehealth: Payer: Self-pay

## 2023-09-26 NOTE — Progress Notes (Signed)
 Care Guide Pharmacy Note  09/26/2023 Name: Brianna [name] MRN: [id number] DOB: 1990/08/09  Referred By: Tommie Sams, DO Reason for referral: Care Coordination (Outreach to schedule with  pharm d)   Brianna [name] is a 33 y.o. year old female who is a primary care patient of Tommie Sams, DO.  Brianna [name] was referred to the pharmacist for assistance related to: DMII  An unsuccessful telephone outreach was attempted today to contact the patient who was referred to the pharmacy team for assistance with medication management. Additional attempts will be made to contact the patient.  Penne Lash , RMA     [facility]  [facility], [facility] Guide  Direct Dial: [phone]  Website: [email or web address]

## 2023-09-27 DIAGNOSIS — M5451 Vertebrogenic low back pain: Secondary | ICD-10-CM | POA: Diagnosis not present

## 2023-09-27 DIAGNOSIS — M5416 Radiculopathy, lumbar region: Secondary | ICD-10-CM | POA: Diagnosis not present

## 2023-10-02 NOTE — Pre-Procedure Instructions (Signed)
 Surgical Instructions   Your procedure is scheduled on October 09, 2023. Report to [facility] Main Entrance "A" at 5:30 A.M., then check in with the Admitting office. Any questions or running late day of surgery: call [phone]  Questions prior to your surgery date: call [phone], Monday-Friday, 8am-4pm. If you experience any cold or flu symptoms such as cough, fever, chills, shortness of breath, etc. between now and your scheduled surgery, please notify us at the above number.     Remember:  Do not eat after midnight the night before your surgery  You may drink clear liquids until 4:30 AM the morning of your surgery.   Clear liquids allowed are: Water, Non-Citrus Juices (without pulp), Carbonated Beverages, Clear Tea (no milk, honey, etc.), Black Coffee Only (NO MILK, CREAM OR POWDERED CREAMER of any kind), and Gatorade.    Take these medicines the morning of surgery with A SIP OF WATER: amLODipine (NORVASC)  gabapentin (NEURONTIN)  levothyroxine (SYNTHROID)    May take these medicines IF NEEDED: oxyCODONE-acetaminophen (PERCOCET/ROXICET)    One week prior to surgery, STOP taking any Aspirin (unless otherwise instructed by your surgeon) Aleve, Naproxen, Ibuprofen, Motrin, Advil, Goody's, BC's, all herbal medications, fish oil, and non-prescription vitamins. This includes your medication: meloxicam (MOBIC)    WHAT DO I DO ABOUT MY DIABETES MEDICATION?   Do not take metFORMIN (GLUCOPHAGE-XR) the morning of surgery.  STOP taking your Dulaglutide (TRULICITY) one week prior to surgery. DO NOT take any doses after March 9th.    HOW TO MANAGE YOUR DIABETES BEFORE AND AFTER SURGERY  Why is it important to control my blood sugar before and after surgery? Improving blood sugar levels before and after surgery helps healing and can limit problems. A way of improving blood sugar control is eating a healthy diet by:  Eating less sugar and carbohydrates  Increasing  activity/exercise  Talking with your doctor about reaching your blood sugar goals High blood sugars (greater than 180 mg/dL) can raise your risk of infections and slow your recovery, so you will need to focus on controlling your diabetes during the weeks before surgery. Make sure that the doctor who takes care of your diabetes knows about your planned surgery including the date and location.  How do I manage my blood sugar before surgery? Check your blood sugar at least 4 times a day, starting 2 days before surgery, to make sure that the level is not too high or low.  Check your blood sugar the morning of your surgery when you wake up and every 2 hours until you get to the Short Stay unit.  If your blood sugar is less than 70 mg/dL, you will need to treat for low blood sugar: Do not take insulin. Treat a low blood sugar (less than 70 mg/dL) with  cup of clear juice (cranberry or apple), 4 glucose tablets, OR glucose gel. Recheck blood sugar in 15 minutes after treatment (to make sure it is greater than 70 mg/dL). If your blood sugar is not greater than 70 mg/dL on recheck, call [phone] for further instructions. Report your blood sugar to the short stay nurse when you get to Short Stay.  If you are admitted to the hospital after surgery: Your blood sugar will be checked by the staff and you will probably be given insulin after surgery (instead of oral diabetes medicines) to make sure you have good blood sugar levels. The goal for blood sugar control after surgery is 80-180 mg/dL.  Do NOT Smoke (Tobacco/Vaping) for 24 hours prior to your procedure.  If you use a CPAP at night, you may bring your mask/headgear for your overnight stay.   You will be asked to remove any contacts, glasses, piercing's, hearing aid's, dentures/partials prior to surgery. Please bring cases for these items if needed.    Patients discharged the day of surgery will not be allowed to drive  home, and someone needs to stay with them for 24 hours.  SURGICAL WAITING ROOM VISITATION Patients may have no more than 2 support people in the waiting area - these visitors may rotate.   Pre-op nurse will coordinate an appropriate time for 1 ADULT support person, who may not rotate, to accompany patient in pre-op.  Children under the age of 64 must have an adult with them who is not the patient and must remain in the main waiting area with an adult.  If the patient needs to stay at the hospital during part of their recovery, the visitor guidelines for inpatient rooms apply.  Please refer to the [facility] website for the visitor guidelines for any additional information.   If you received a COVID test during your pre-op visit  it is requested that you wear a mask when out in public, stay away from anyone that may not be feeling well and notify your surgeon if you develop symptoms. If you have been in contact with anyone that has tested positive in the last 10 days please notify you surgeon.      Pre-operative 5 CHG Bathing Instructions   You can play a key role in reducing the risk of infection after surgery. Your skin needs to be as free of germs as possible. You can reduce the number of germs on your skin by washing with CHG (chlorhexidine gluconate) soap before surgery. CHG is an antiseptic soap that kills germs and continues to kill germs even after washing.   DO NOT use if you have an allergy to chlorhexidine/CHG or antibacterial soaps. If your skin becomes reddened or irritated, stop using the CHG and notify one of our RNs at [phone].   Please shower with the CHG soap starting 4 days before surgery using the following schedule:     Please keep in mind the following:  DO NOT shave, including legs and underarms, starting the day of your first shower.   You may shave your face at any point before/day of surgery.  Place clean sheets on your bed the day you start using CHG  soap. Use a clean washcloth (not used since being washed) for each shower. DO NOT sleep with pets once you start using the CHG.   CHG Shower Instructions:  Wash your face and private area with normal soap. If you choose to wash your hair, wash first with your normal shampoo.  After you use shampoo/soap, rinse your hair and body thoroughly to remove shampoo/soap residue.  Turn the water OFF and apply about 3 tablespoons (45 ml) of CHG soap to a CLEAN washcloth.  Apply CHG soap ONLY FROM YOUR NECK DOWN TO YOUR TOES (washing for 3-5 minutes)  DO NOT use CHG soap on face, private areas, open wounds, or sores.  Pay special attention to the area where your surgery is being performed.  If you are having back surgery, having someone wash your back for you may be helpful. Wait 2 minutes after CHG soap is applied, then you may rinse off the CHG soap.  Pat dry with a  clean towel  Put on clean clothes/pajamas   If you choose to wear lotion, please use ONLY the CHG-compatible lotions that are listed below.  Additional instructions for the day of surgery: DO NOT APPLY any lotions, deodorants, cologne, or perfumes.   Do not bring valuables to the hospital. [facility] is not responsible for any belongings/valuables. Do not wear nail polish, gel polish, artificial nails, or any other type of covering on natural nails (fingers and toes) Do not wear jewelry or makeup Put on clean/comfortable clothes.  Please brush your teeth.  Ask your nurse before applying any prescription medications to the skin.     CHG Compatible Lotions   Aveeno Moisturizing lotion  Cetaphil Moisturizing Cream  Cetaphil Moisturizing Lotion  Clairol Herbal Essence Moisturizing Lotion, Dry Skin  Clairol Herbal Essence Moisturizing Lotion, Extra Dry Skin  Clairol Herbal Essence Moisturizing Lotion, Normal Skin  Curel Age Defying Therapeutic Moisturizing Lotion with Alpha Hydroxy  Curel Extreme Care Body Lotion  Curel Soothing  Hands Moisturizing Hand Lotion  Curel Therapeutic Moisturizing Cream, Fragrance-Free  Curel Therapeutic Moisturizing Lotion, Fragrance-Free  Curel Therapeutic Moisturizing Lotion, Original Formula  Eucerin Daily Replenishing Lotion  Eucerin Dry Skin Therapy Plus Alpha Hydroxy Crme  Eucerin Dry Skin Therapy Plus Alpha Hydroxy Lotion  Eucerin Original Crme  Eucerin Original Lotion  Eucerin Plus Crme Eucerin Plus Lotion  Eucerin TriLipid Replenishing Lotion  Keri Anti-Bacterial Hand Lotion  Keri Deep Conditioning Original Lotion Dry Skin Formula Softly Scented  Keri Deep Conditioning Original Lotion, Fragrance Free Sensitive Skin Formula  Keri Lotion Fast Absorbing Fragrance Free Sensitive Skin Formula  Keri Lotion Fast Absorbing Softly Scented Dry Skin Formula  Keri Original Lotion  Keri Skin Renewal Lotion Keri Silky Smooth Lotion  Keri Silky Smooth Sensitive Skin Lotion  Nivea Body Creamy Conditioning Oil  Nivea Body Extra Enriched Lotion  Nivea Body Original Lotion  Nivea Body Sheer Moisturizing Lotion Nivea Crme  Nivea Skin Firming Lotion  NutraDerm 30 Skin Lotion  NutraDerm Skin Lotion  NutraDerm Therapeutic Skin Cream  NutraDerm Therapeutic Skin Lotion  ProShield Protective Hand Cream  Provon moisturizing lotion  Please read over the following fact sheets that you were given.

## 2023-10-03 ENCOUNTER — Other Ambulatory Visit: Payer: Self-pay

## 2023-10-03 ENCOUNTER — Encounter (HOSPITAL_COMMUNITY): Payer: Self-pay

## 2023-10-03 ENCOUNTER — Encounter (HOSPITAL_COMMUNITY)
Admission: RE | Admit: 2023-10-03 | Discharge: 2023-10-03 | Disposition: A | Discharge status: Home / Self Care | Source: Ambulatory Visit | Attending: Orthopedic Surgery | Admitting: Orthopedic Surgery

## 2023-10-03 VITALS — BP 156/104 | HR 94 | Temp 98.4°F | Resp 17 | Ht 64.0 in | Wt 291.3 lb

## 2023-10-03 DIAGNOSIS — Z01818 Encounter for other preprocedural examination: Secondary | ICD-10-CM | POA: Diagnosis not present

## 2023-10-03 DIAGNOSIS — E119 Type 2 diabetes mellitus without complications: Secondary | ICD-10-CM | POA: Diagnosis not present

## 2023-10-03 HISTORY — DX: Hypothyroidism, unspecified: E03.9

## 2023-10-03 HISTORY — DX: Anemia, unspecified: D64.9

## 2023-10-03 HISTORY — DX: Sleep apnea, unspecified: G47.30

## 2023-10-03 LAB — TYPE AND SCREEN
ABO/RH(D): O POS
Antibody Screen: NEGATIVE

## 2023-10-03 LAB — BASIC METABOLIC PANEL
Anion gap: 5 (ref 5–15)
BUN: 11 mg/dL (ref 6–20)
CO2: 25 mmol/L (ref 22–32)
Calcium: 8.9 mg/dL (ref 8.9–10.3)
Chloride: 110 mmol/L (ref 98–111)
Creatinine, Ser: 0.56 mg/dL (ref 0.44–1.00)
GFR, Estimated: >60 mL/min (ref >60)
Glucose, Bld: 124 mg/dL — ABNORMAL HIGH (ref 70–99)
Potassium: 3.7 mmol/L (ref 3.5–5.1)
Sodium: 140 mmol/L (ref 135–145)

## 2023-10-03 LAB — SURGICAL PCR SCREEN
MRSA, PCR: NEGATIVE
Staphylococcus aureus: NEGATIVE

## 2023-10-03 LAB — GLUCOSE, CAPILLARY: Glucose-Capillary: 117 mg/dL — ABNORMAL HIGH (ref 70–99)

## 2023-10-03 NOTE — Progress Notes (Signed)
 PCP - Everlene Other, DO Cardiologist - Denies  PPM/ICD - Denies Device Orders - n/a Rep Notified - n/a  Chest x-ray - n/a EKG - 10/03/2023 Stress Test - Denies ECHO - Denies Cardiac Cath - Denies  Sleep Study - +OSA but pt unable to tolerate CPAP  Pt is DM2. She has a meter at home, but does not check her blood sugar. Fasting sugar not known. CBG at pre-op appointment 117. Last A1c 7.4 on 09/27/2023  Last dose of GLP1 agonist- Last dose of Trulicity 3/7 GLP1 instructions: Pt instructed to not take anymore doses prior to surgery  Blood Thinner Instructions: n/a Aspirin Instructions: n/a  ERAS Protcol - Clear liquids until 0430 morning of surgery PRE-SURGERY Ensure or G2- n/a  COVID TEST- n/a   Anesthesia review: Yes. Medical Clearance  Patient denies shortness of breath, fever, cough and chest pain at PAT appointment. Pt denies any respiratory illness/infection in the last two months.    All instructions explained to the patient, with a verbal understanding of the material. Patient agrees to go over the instructions while at home for a better understanding. Patient also instructed to self quarantine after being tested for COVID-19. The opportunity to ask questions was provided.

## 2023-10-05 NOTE — Progress Notes (Signed)
 Care Guide Pharmacy Note  10/05/2023 Name: SHARELLE BURDITT MRN: [id number] DOB: 1991/02/14  Referred By: Tommie Sams, DO Reason for referral: Care Coordination (Outreach to schedule with  pharm d)   AMELIYAH SARNO is a 33 y.o. year old female who is a primary care patient of Tommie Sams, DO.  Greer Ee was referred to the pharmacist for assistance related to: DMII  Successful contact was made with the patient to discuss pharmacy services including being ready for the pharmacist to call at least 5 minutes before the scheduled appointment time and to have medication bottles and any blood pressure readings ready for review. The patient agreed to meet with the pharmacist via telephone visit on (date/time).11/01/2023  Penne Lash , RMA     [place]  [facility], [facility] Guide  Direct Dial: [phone]  Website: [email or web address]

## 2023-10-06 ENCOUNTER — Other Ambulatory Visit: Payer: Self-pay | Admitting: Nurse Practitioner

## 2023-10-08 NOTE — Anesthesia Preprocedure Evaluation (Signed)
 Anesthesia Evaluation  Patient identified by MRN, date of birth, ID band Patient awake    Reviewed: Allergy & Precautions, H&P , NPO status , Patient's Chart, lab work & pertinent test results  Airway Mallampati: II  TM Distance: >3 FB Neck ROM: Full    Dental no notable dental hx. (+) Teeth Intact, Dental Advisory Given   Pulmonary sleep apnea    Pulmonary exam normal breath sounds clear to auscultation       Cardiovascular Exercise Tolerance: Good hypertension, Pt. on medications  Rhythm:Regular Rate:Normal     Neuro/Psych negative neurological ROS  negative psych ROS   GI/Hepatic negative GI ROS, Neg liver ROS,,,  Endo/Other  diabetes, Type 2, Oral Hypoglycemic AgentsHypothyroidism  Class 4 obesity  Renal/GU negative Renal ROS  negative genitourinary   Musculoskeletal   Abdominal   Peds  Hematology  (+) Blood dyscrasia, anemia   Anesthesia Other Findings   Reproductive/Obstetrics negative OB ROS                             Anesthesia Physical Anesthesia Plan  ASA: 4  Anesthesia Plan: General   Post-op Pain Management: Tylenol PO (pre-op)*   Induction: Intravenous  PONV Risk Score and Plan: 4 or greater and Ondansetron, Dexamethasone and Midazolam  Airway Management Planned: Oral ETT  Additional Equipment:   Intra-op Plan:   Post-operative Plan: Extubation in OR  Informed Consent: I have reviewed the patients History and Physical, chart, labs and discussed the procedure including the risks, benefits and alternatives for the proposed anesthesia with the patient or authorized representative who has indicated his/her understanding and acceptance.     Dental advisory given  Plan Discussed with: CRNA  Anesthesia Plan Comments:        Anesthesia Quick Evaluation

## 2023-10-09 ENCOUNTER — Other Ambulatory Visit: Payer: Self-pay

## 2023-10-09 ENCOUNTER — Inpatient Hospital Stay (HOSPITAL_COMMUNITY): Payer: Self-pay | Admitting: Certified Registered"

## 2023-10-09 ENCOUNTER — Inpatient Hospital Stay (HOSPITAL_COMMUNITY): Payer: Self-pay | Admitting: Physician Assistant

## 2023-10-09 ENCOUNTER — Inpatient Hospital Stay (HOSPITAL_COMMUNITY)
Admission: RE | Admit: 2023-10-09 | Discharge: 2023-10-11 | DRG: 451 | Disposition: A | Discharge status: Home / Self Care | Payer: BC Managed Care – PPO | Attending: Orthopedic Surgery | Admitting: Orthopedic Surgery

## 2023-10-09 ENCOUNTER — Encounter (HOSPITAL_COMMUNITY)
Admission: RE | Disposition: A | Discharge status: Home / Self Care | Payer: Self-pay | Source: Home / Self Care | Attending: Orthopedic Surgery

## 2023-10-09 ENCOUNTER — Encounter (HOSPITAL_COMMUNITY): Payer: Self-pay | Admitting: Orthopedic Surgery

## 2023-10-09 ENCOUNTER — Inpatient Hospital Stay (HOSPITAL_COMMUNITY)

## 2023-10-09 DIAGNOSIS — M48061 Spinal stenosis, lumbar region without neurogenic claudication: Principal | ICD-10-CM | POA: Diagnosis present

## 2023-10-09 DIAGNOSIS — Z791 Long term (current) use of non-steroidal anti-inflammatories (NSAID): Secondary | ICD-10-CM

## 2023-10-09 DIAGNOSIS — Z79890 Hormone replacement therapy (postmenopausal): Secondary | ICD-10-CM

## 2023-10-09 DIAGNOSIS — E039 Hypothyroidism, unspecified: Secondary | ICD-10-CM | POA: Diagnosis present

## 2023-10-09 DIAGNOSIS — I10 Essential (primary) hypertension: Secondary | ICD-10-CM | POA: Diagnosis not present

## 2023-10-09 DIAGNOSIS — Z79899 Other long term (current) drug therapy: Secondary | ICD-10-CM

## 2023-10-09 DIAGNOSIS — M21371 Foot drop, right foot: Secondary | ICD-10-CM | POA: Diagnosis present

## 2023-10-09 DIAGNOSIS — G4733 Obstructive sleep apnea (adult) (pediatric): Secondary | ICD-10-CM | POA: Diagnosis not present

## 2023-10-09 DIAGNOSIS — G4730 Sleep apnea, unspecified: Secondary | ICD-10-CM | POA: Diagnosis not present

## 2023-10-09 DIAGNOSIS — Z7984 Long term (current) use of oral hypoglycemic drugs: Secondary | ICD-10-CM | POA: Diagnosis not present

## 2023-10-09 DIAGNOSIS — E119 Type 2 diabetes mellitus without complications: Secondary | ICD-10-CM | POA: Diagnosis not present

## 2023-10-09 DIAGNOSIS — M5116 Intervertebral disc disorders with radiculopathy, lumbar region: Secondary | ICD-10-CM | POA: Diagnosis not present

## 2023-10-09 DIAGNOSIS — M5126 Other intervertebral disc displacement, lumbar region: Secondary | ICD-10-CM | POA: Diagnosis not present

## 2023-10-09 DIAGNOSIS — Z0189 Encounter for other specified special examinations: Secondary | ICD-10-CM | POA: Diagnosis not present

## 2023-10-09 DIAGNOSIS — Z981 Arthrodesis status: Principal | ICD-10-CM

## 2023-10-09 HISTORY — PX: TRANSFORAMINAL LUMBAR INTERBODY FUSION (TLIF) WITH PEDICLE SCREW FIXATION 1 LEVEL: SHX6141

## 2023-10-09 LAB — GLUCOSE, CAPILLARY
Glucose-Capillary: 134 mg/dL — ABNORMAL HIGH (ref 70–99)
Glucose-Capillary: 74 mg/dL (ref 70–99)
Glucose-Capillary: 168 mg/dL — ABNORMAL HIGH (ref 70–99)
Glucose-Capillary: 164 mg/dL — ABNORMAL HIGH (ref 70–99)

## 2023-10-09 LAB — ABO/RH: ABO/RH(D): O POS

## 2023-10-09 LAB — POCT PREGNANCY, URINE: Preg Test, Ur: NEGATIVE

## 2023-10-09 SURGERY — TRANSFORAMINAL LUMBAR INTERBODY FUSION (TLIF) WITH PEDICLE SCREW FIXATION 1 LEVEL
Anesthesia: General | Site: Spine Lumbar

## 2023-10-09 MED ORDER — ACETAMINOPHEN 500 MG PO TABS: 1000.0000 mg | ORAL_TABLET | Freq: Once | ORAL | Status: DC

## 2023-10-09 MED ORDER — SODIUM CHLORIDE 0.9 % IV SOLN
0.1500 ug/kg/min | INTRAVENOUS | Status: DC
  Administered 2023-10-09 (×2): .07 ug/kg/min via INTRAVENOUS
  Filled 2023-10-09 (×3): qty 2000

## 2023-10-09 MED ORDER — KETAMINE HCL 10 MG/ML IJ SOLN
INTRAMUSCULAR | Status: DC | PRN
  Administered 2023-10-09: 40 mg via INTRAVENOUS
  Administered 2023-10-09: 10 mg via INTRAVENOUS

## 2023-10-09 MED ORDER — PHENYLEPHRINE HCL-NACL 20-0.9 MG/250ML-% IV SOLN
INTRAVENOUS | Status: DC | PRN
  Administered 2023-10-09: 35 ug/min via INTRAVENOUS

## 2023-10-09 MED ORDER — MAGNESIUM CITRATE PO SOLN
1.0000 | Freq: Once | ORAL | Status: AC | PRN
  Administered 2023-10-10: 1 via ORAL
  Filled 2023-10-09: qty 296

## 2023-10-09 MED ORDER — MIDAZOLAM HCL 2 MG/2ML IJ SOLN
INTRAMUSCULAR | Status: AC
  Filled 2023-10-09: qty 2

## 2023-10-09 MED ORDER — BUPIVACAINE-EPINEPHRINE 0.25% -1:200000 IJ SOLN
INTRAMUSCULAR | Status: DC | PRN
  Administered 2023-10-09: 19 mL

## 2023-10-09 MED ORDER — HYDROMORPHONE HCL 1 MG/ML IJ SOLN
INTRAMUSCULAR | Status: AC
  Filled 2023-10-09: qty 1

## 2023-10-09 MED ORDER — PHENYLEPHRINE 80 MCG/ML (10ML) SYRINGE FOR IV PUSH (FOR BLOOD PRESSURE SUPPORT)
PREFILLED_SYRINGE | INTRAVENOUS | Status: AC
  Filled 2023-10-09: qty 10

## 2023-10-09 MED ORDER — SUCCINYLCHOLINE CHLORIDE 200 MG/10ML IV SOSY
PREFILLED_SYRINGE | INTRAVENOUS | Status: AC
  Filled 2023-10-09: qty 10

## 2023-10-09 MED ORDER — METFORMIN HCL ER 500 MG PO TB24
500.0000 mg | ORAL_TABLET | Freq: Two times a day (BID) | ORAL | Status: DC
  Administered 2023-10-09 – 2023-10-11 (×4): 500 mg via ORAL
  Filled 2023-10-09 (×4): qty 1

## 2023-10-09 MED ORDER — HYDROMORPHONE HCL 1 MG/ML IJ SOLN
INTRAMUSCULAR | Status: AC
  Filled 2023-10-09: qty 0.5

## 2023-10-09 MED ORDER — PROPOFOL 500 MG/50ML IV EMUL
INTRAVENOUS | Status: DC | PRN
  Administered 2023-10-09: 100 ug/kg/min via INTRAVENOUS

## 2023-10-09 MED ORDER — TRANEXAMIC ACID-NACL 1000-0.7 MG/100ML-% IV SOLN
INTRAVENOUS | Status: DC | PRN
Start: 2023-10-09 — End: 2023-10-09
  Administered 2023-10-09: 1000 mg via INTRAVENOUS

## 2023-10-09 MED ORDER — THROMBIN 20000 UNITS EX KIT
PACK | CUTANEOUS | Status: AC
  Filled 2023-10-09: qty 1

## 2023-10-09 MED ORDER — DEXTROSE 5 % IV SOLN
INTRAVENOUS | Status: DC | PRN
  Administered 2023-10-09 (×2): 3 g via INTRAVENOUS

## 2023-10-09 MED ORDER — METHOCARBAMOL 500 MG PO TABS: 500.0000 mg | ORAL_TABLET | Freq: Three times a day (TID) | ORAL | 0 refills | Status: AC | PRN

## 2023-10-09 MED ORDER — OXYCODONE HCL 5 MG PO TABS
10.0000 mg | ORAL_TABLET | ORAL | Status: DC | PRN
  Administered 2023-10-09 – 2023-10-11 (×13): 10 mg via ORAL
  Filled 2023-10-09 (×13): qty 2

## 2023-10-09 MED ORDER — METHOCARBAMOL 1000 MG/10ML IJ SOLN: 500.0000 mg | Freq: Four times a day (QID) | INTRAMUSCULAR | Status: DC | PRN

## 2023-10-09 MED ORDER — PROPOFOL 10 MG/ML IV BOLUS
INTRAVENOUS | Status: AC
  Filled 2023-10-09: qty 20

## 2023-10-09 MED ORDER — MENTHOL 3 MG MT LOZG: 1.0000 | LOZENGE | OROMUCOSAL | Status: DC | PRN

## 2023-10-09 MED ORDER — POLYETHYLENE GLYCOL 3350 17 G PO PACK
17.0000 g | PACK | Freq: Every day | ORAL | Status: DC | PRN
  Administered 2023-10-09: 17 g via ORAL
  Filled 2023-10-09: qty 1

## 2023-10-09 MED ORDER — SURGIFLO WITH THROMBIN (HEMOSTATIC MATRIX KIT) OPTIME
TOPICAL | Status: DC | PRN
  Administered 2023-10-09 (×2): 1 via TOPICAL

## 2023-10-09 MED ORDER — ORAL CARE MOUTH RINSE: 15.0000 mL | Freq: Once | OROMUCOSAL | Status: AC

## 2023-10-09 MED ORDER — LIDOCAINE 2% (20 MG/ML) 5 ML SYRINGE
INTRAMUSCULAR | Status: DC | PRN
  Administered 2023-10-09: 80 mg via INTRAVENOUS

## 2023-10-09 MED ORDER — ENALAPRIL MALEATE 10 MG PO TABS
20.0000 mg | ORAL_TABLET | Freq: Every day | ORAL | Status: DC
  Administered 2023-10-09 – 2023-10-11 (×3): 20 mg via ORAL
  Filled 2023-10-09 (×3): qty 2

## 2023-10-09 MED ORDER — GLYCOPYRROLATE PF 0.2 MG/ML IJ SOSY
PREFILLED_SYRINGE | INTRAMUSCULAR | Status: AC
  Filled 2023-10-09: qty 1

## 2023-10-09 MED ORDER — ONDANSETRON HCL 4 MG/2ML IJ SOLN
INTRAMUSCULAR | Status: DC | PRN
  Administered 2023-10-09: 4 mg via INTRAVENOUS

## 2023-10-09 MED ORDER — PROPOFOL 1000 MG/100ML IV EMUL
INTRAVENOUS | Status: AC
  Filled 2023-10-09: qty 100

## 2023-10-09 MED ORDER — KETAMINE HCL 50 MG/5ML IJ SOSY
PREFILLED_SYRINGE | INTRAMUSCULAR | Status: AC
Start: 2023-10-09 — End: ?
  Filled 2023-10-09: qty 5

## 2023-10-09 MED ORDER — INSULIN ASPART 100 UNIT/ML IJ SOLN: 0.0000 [IU] | Freq: Three times a day (TID) | INTRAMUSCULAR | Status: DC

## 2023-10-09 MED ORDER — CEFAZOLIN SODIUM-DEXTROSE 1-4 GM/50ML-% IV SOLN
1.0000 g | Freq: Three times a day (TID) | INTRAVENOUS | Status: AC
  Administered 2023-10-09 (×2): 1 g via INTRAVENOUS
  Filled 2023-10-09 (×2): qty 50

## 2023-10-09 MED ORDER — FENTANYL CITRATE (PF) 250 MCG/5ML IJ SOLN
INTRAMUSCULAR | Status: DC | PRN
Start: 2023-10-09 — End: 2023-10-09
  Administered 2023-10-09 (×2): 50 ug via INTRAVENOUS
  Administered 2023-10-09 (×2): 100 ug via INTRAVENOUS
  Administered 2023-10-09 (×2): 50 ug via INTRAVENOUS
  Administered 2023-10-09: 100 ug via INTRAVENOUS

## 2023-10-09 MED ORDER — OXYCODONE HCL 5 MG PO TABS
5.0000 mg | ORAL_TABLET | ORAL | Status: DC | PRN
  Administered 2023-10-09: 5 mg via ORAL
  Filled 2023-10-09 (×2): qty 1

## 2023-10-09 MED ORDER — CEFAZOLIN SODIUM 1 G IJ SOLR
INTRAMUSCULAR | Status: AC
  Filled 2023-10-09: qty 30

## 2023-10-09 MED ORDER — GABAPENTIN 300 MG PO CAPS
300.0000 mg | ORAL_CAPSULE | Freq: Three times a day (TID) | ORAL | Status: DC
  Administered 2023-10-09 – 2023-10-11 (×6): 300 mg via ORAL
  Filled 2023-10-09 (×6): qty 1

## 2023-10-09 MED ORDER — SENNOSIDES-DOCUSATE SODIUM 8.6-50 MG PO TABS
1.0000 | ORAL_TABLET | Freq: Two times a day (BID) | ORAL | Status: DC
  Administered 2023-10-09 – 2023-10-11 (×5): 1 via ORAL
  Filled 2023-10-09 (×5): qty 1

## 2023-10-09 MED ORDER — THROMBIN 20000 UNITS EX SOLR: CUTANEOUS | Status: DC | PRN

## 2023-10-09 MED ORDER — PHENOL 1.4 % MT LIQD: 1.0000 | OROMUCOSAL | Status: DC | PRN

## 2023-10-09 MED ORDER — ACETAMINOPHEN 10 MG/ML IV SOLN
INTRAVENOUS | Status: AC
Start: 2023-10-09 — End: ?
  Filled 2023-10-09: qty 100

## 2023-10-09 MED ORDER — SUCCINYLCHOLINE CHLORIDE 200 MG/10ML IV SOSY
PREFILLED_SYRINGE | INTRAVENOUS | Status: DC | PRN
  Administered 2023-10-09: 160 mg via INTRAVENOUS

## 2023-10-09 MED ORDER — HYDROMORPHONE HCL 1 MG/ML IJ SOLN
INTRAMUSCULAR | Status: DC | PRN
  Administered 2023-10-09 (×2): .5 mg via INTRAVENOUS

## 2023-10-09 MED ORDER — SODIUM CHLORIDE 0.9% FLUSH: 3.0000 mL | Freq: Two times a day (BID) | INTRAVENOUS | Status: DC

## 2023-10-09 MED ORDER — METHOCARBAMOL 500 MG PO TABS
500.0000 mg | ORAL_TABLET | Freq: Four times a day (QID) | ORAL | Status: DC | PRN
  Administered 2023-10-09 – 2023-10-11 (×5): 500 mg via ORAL
  Filled 2023-10-09 (×7): qty 1

## 2023-10-09 MED ORDER — 0.9 % SODIUM CHLORIDE (POUR BTL) OPTIME
TOPICAL | Status: DC | PRN
  Administered 2023-10-09: 2000 mL

## 2023-10-09 MED ORDER — MIDAZOLAM HCL 2 MG/2ML IJ SOLN
INTRAMUSCULAR | Status: DC | PRN
  Administered 2023-10-09: 2 mg via INTRAVENOUS

## 2023-10-09 MED ORDER — ONDANSETRON HCL 4 MG/2ML IJ SOLN
4.0000 mg | Freq: Four times a day (QID) | INTRAMUSCULAR | Status: DC | PRN
  Administered 2023-10-09: 4 mg via INTRAVENOUS
  Filled 2023-10-09: qty 2

## 2023-10-09 MED ORDER — CHLORHEXIDINE GLUCONATE 0.12 % MT SOLN
15.0000 mL | Freq: Once | OROMUCOSAL | Status: AC
  Administered 2023-10-09: 15 mL via OROMUCOSAL

## 2023-10-09 MED ORDER — ONDANSETRON HCL 4 MG PO TABS: 4.0000 mg | ORAL_TABLET | Freq: Four times a day (QID) | ORAL | Status: DC | PRN

## 2023-10-09 MED ORDER — TRANEXAMIC ACID-NACL 1000-0.7 MG/100ML-% IV SOLN
INTRAVENOUS | Status: AC
  Filled 2023-10-09: qty 100

## 2023-10-09 MED ORDER — INSULIN ASPART 100 UNIT/ML IJ SOLN: 0.0000 [IU] | INTRAMUSCULAR | Status: DC | PRN

## 2023-10-09 MED ORDER — ACETAMINOPHEN 650 MG RE SUPP: 650.0000 mg | RECTAL | Status: DC | PRN

## 2023-10-09 MED ORDER — CHLORHEXIDINE GLUCONATE 0.12 % MT SOLN
OROMUCOSAL | Status: AC
  Filled 2023-10-09: qty 15

## 2023-10-09 MED ORDER — SODIUM CHLORIDE 0.9% FLUSH: 3.0000 mL | INTRAVENOUS | Status: DC | PRN

## 2023-10-09 MED ORDER — LACTATED RINGERS IV SOLN: INTRAVENOUS | Status: DC

## 2023-10-09 MED ORDER — SODIUM CHLORIDE 0.9 % IV SOLN: 250.0000 mL | INTRAVENOUS | Status: AC

## 2023-10-09 MED ORDER — AMLODIPINE BESYLATE 10 MG PO TABS
10.0000 mg | ORAL_TABLET | Freq: Every day | ORAL | Status: DC
  Administered 2023-10-10 – 2023-10-11 (×2): 10 mg via ORAL
  Filled 2023-10-09 (×2): qty 1

## 2023-10-09 MED ORDER — PROPOFOL 10 MG/ML IV BOLUS
INTRAVENOUS | Status: DC | PRN
  Administered 2023-10-09: 200 mg via INTRAVENOUS

## 2023-10-09 MED ORDER — BUPIVACAINE-EPINEPHRINE (PF) 0.25% -1:200000 IJ SOLN
INTRAMUSCULAR | Status: AC
  Filled 2023-10-09: qty 30

## 2023-10-09 MED ORDER — SODIUM CHLORIDE 0.9 % IV SOLN: INTRAVENOUS | Status: DC | PRN

## 2023-10-09 MED ORDER — GLYCOPYRROLATE PF 0.2 MG/ML IJ SOSY
PREFILLED_SYRINGE | INTRAMUSCULAR | Status: DC | PRN
  Administered 2023-10-09: .2 mg via INTRAVENOUS

## 2023-10-09 MED ORDER — HYDROMORPHONE HCL 1 MG/ML IJ SOLN
0.2500 mg | INTRAMUSCULAR | Status: DC | PRN
  Administered 2023-10-09 (×2): 0.5 mg via INTRAVENOUS

## 2023-10-09 MED ORDER — ACETAMINOPHEN 325 MG PO TABS
650.0000 mg | ORAL_TABLET | ORAL | Status: DC | PRN
  Administered 2023-10-10 – 2023-10-11 (×2): 650 mg via ORAL
  Filled 2023-10-09 (×2): qty 2

## 2023-10-09 MED ORDER — ONDANSETRON HCL 4 MG PO TABS: 4.0000 mg | ORAL_TABLET | Freq: Three times a day (TID) | ORAL | 0 refills | Status: DC | PRN

## 2023-10-09 MED ORDER — FENTANYL CITRATE (PF) 250 MCG/5ML IJ SOLN
INTRAMUSCULAR | Status: AC
  Filled 2023-10-09: qty 5

## 2023-10-09 MED ORDER — LEVOTHYROXINE SODIUM 75 MCG PO TABS
75.0000 ug | ORAL_TABLET | Freq: Every day | ORAL | Status: DC
  Administered 2023-10-10 – 2023-10-11 (×2): 75 ug via ORAL
  Filled 2023-10-09 (×2): qty 1

## 2023-10-09 MED ORDER — ALBUMIN HUMAN 5 % IV SOLN: INTRAVENOUS | Status: DC | PRN

## 2023-10-09 MED ORDER — ACETAMINOPHEN 10 MG/ML IV SOLN
INTRAVENOUS | Status: DC | PRN
  Administered 2023-10-09: 1000 mg via INTRAVENOUS

## 2023-10-09 MED ORDER — HYDROMORPHONE HCL 1 MG/ML IJ SOLN: 1.0000 mg | INTRAMUSCULAR | Status: AC | PRN

## 2023-10-09 MED ORDER — LIDOCAINE 2% (20 MG/ML) 5 ML SYRINGE
INTRAMUSCULAR | Status: AC
  Filled 2023-10-09: qty 5

## 2023-10-09 MED ORDER — PHENYLEPHRINE 80 MCG/ML (10ML) SYRINGE FOR IV PUSH (FOR BLOOD PRESSURE SUPPORT)
PREFILLED_SYRINGE | INTRAVENOUS | Status: DC | PRN
  Administered 2023-10-09: 160 ug via INTRAVENOUS

## 2023-10-09 MED ORDER — INSULIN ASPART 100 UNIT/ML IJ SOLN: 0.0000 [IU] | Freq: Every day | INTRAMUSCULAR | Status: DC

## 2023-10-09 MED ORDER — OXYCODONE-ACETAMINOPHEN 10-325 MG PO TABS: 1.0000 | ORAL_TABLET | Freq: Four times a day (QID) | ORAL | 0 refills | Status: AC | PRN

## 2023-10-09 SURGICAL SUPPLY — 70 items
BAG COUNTER SPONGE SURGICOUNT (BAG) ×1 IMPLANT
BLADE CLIPPER SURG (BLADE) IMPLANT
BUR EGG ELITE 4.0 (BURR) ×1 IMPLANT
BUR MATCHSTICK NEURO 3.0 LAGG (BURR) ×1 IMPLANT
CAGE MOD EX PL 7X9X24 17D (Cage) IMPLANT
CANISTER SUCT 3000ML PPV (MISCELLANEOUS) ×1 IMPLANT
CLIP NEUROVISION LG (NEUROSURGERY SUPPLIES) IMPLANT
CLSR STERI-STRIP ANTIMIC 1/2X4 (GAUZE/BANDAGES/DRESSINGS) ×1 IMPLANT
COVER SURGICAL LIGHT HANDLE (MISCELLANEOUS) ×1 IMPLANT
DRAIN CHANNEL 15F RND FF W/TCR (WOUND CARE) IMPLANT
DRAPE C-ARM 42X72 X-RAY (DRAPES) ×1 IMPLANT
DRAPE C-ARMOR (DRAPES) ×1 IMPLANT
DRAPE POUCH INSTRU U-SHP 10X18 (DRAPES) IMPLANT
DRAPE SURG 17X23 STRL (DRAPES) ×1 IMPLANT
DRAPE U-SHAPE 47X51 STRL (DRAPES) ×1 IMPLANT
DRSG OPSITE POSTOP 4X8 (GAUZE/BANDAGES/DRESSINGS) ×1 IMPLANT
DURAPREP 26ML APPLICATOR (WOUND CARE) ×1 IMPLANT
ELECT BLADE 6.5 EXT (BLADE) ×1 IMPLANT
ELECT NVM5 SURFACE MEP/EMG (ELECTRODE) IMPLANT
ELECT PENCIL ROCKER SW 15FT (MISCELLANEOUS) ×1 IMPLANT
ELECT REM PT RETURN 9FT ADLT (ELECTROSURGICAL) ×1 IMPLANT
ELECTRODE REM PT RTRN 9FT ADLT (ELECTROSURGICAL) ×1 IMPLANT
GLOVE BIOGEL PI IND STRL 8.5 (GLOVE) ×1 IMPLANT
GLOVE SS BIOGEL STRL SZ 8.5 (GLOVE) ×1 IMPLANT
GOWN STRL REUS W/TWL 2XL LVL3 (GOWN DISPOSABLE) ×1 IMPLANT
GUIDEWIRE NITINOL BEVEL TIP (WIRE) IMPLANT
KIT BASIN OR (CUSTOM PROCEDURE TRAY) ×1 IMPLANT
KIT POSITION SURG JACKSON T1 (MISCELLANEOUS) IMPLANT
KIT SURGICAL ACCESS MAXCESS 4 (KITS) IMPLANT
KIT TURNOVER KIT B (KITS) ×1 IMPLANT
MODULE EMG NDL SSEP NVM5 (NEUROSURGERY SUPPLIES) IMPLANT
MODULE EMG NEEDLE SSEP NVM5 (NEUROSURGERY SUPPLIES) ×1 IMPLANT
NDL 22X1.5 STRL (OR ONLY) (MISCELLANEOUS) IMPLANT
NDL I-PASS III (NEEDLE) IMPLANT
NDL SPNL 18GX3.5 QUINCKE PK (NEEDLE) ×1 IMPLANT
NEEDLE 22X1.5 STRL (OR ONLY) (MISCELLANEOUS) ×1 IMPLANT
NEEDLE I-PASS III (NEEDLE) ×1 IMPLANT
NEEDLE SPNL 18GX3.5 QUINCKE PK (NEEDLE) ×1 IMPLANT
NS IRRIG 1000ML POUR BTL (IV SOLUTION) ×1 IMPLANT
PACK LAMINECTOMY ORTHO (CUSTOM PROCEDURE TRAY) ×1 IMPLANT
PACK UNIVERSAL I (CUSTOM PROCEDURE TRAY) ×1 IMPLANT
PAD ARMBOARD POSITIONER FOAM (MISCELLANEOUS) ×2 IMPLANT
PATTIES SURGICAL .5 X.5 (GAUZE/BANDAGES/DRESSINGS) IMPLANT
PATTIES SURGICAL .5 X1 (DISPOSABLE) ×1 IMPLANT
POSITIONER HEAD PRONE TRACH (MISCELLANEOUS) ×1 IMPLANT
PROBE BALL TIP NVM5 SNG USE (NEUROSURGERY SUPPLIES) IMPLANT
PUTTY DBM INSTAFILL CART 5CC (Putty) IMPLANT
REDUCTION EXT RELINE MAS MOD (Neuro Prosthesis/Implant) IMPLANT
ROD RELINE MAS TI LORD 5.5X40 (Rod) IMPLANT
SCREW LOCK RELINE 5.5 TULIP (Screw) IMPLANT
SCREW RELINE MAS POLY 6.5X40MM (Screw) IMPLANT
SCREW SHANK MAS MOD 6.5X40MM (Screw) IMPLANT
SPONGE SURGIFOAM ABS GEL 100 (HEMOSTASIS) ×1 IMPLANT
SPONGE T-LAP 4X18 ~~LOC~~+RFID (SPONGE) ×3 IMPLANT
SURGIFLO W/THROMBIN 8M KIT (HEMOSTASIS) ×1 IMPLANT
SUT BONE WAX W31G (SUTURE) ×1 IMPLANT
SUT MNCRL AB 3-0 PS2 27 (SUTURE) ×2 IMPLANT
SUT MNCRL+ AB 3-0 CT1 36 (SUTURE) ×1 IMPLANT
SUT STRATAFIX 1PDS 45CM VIOLET (SUTURE) IMPLANT
SUT VIC AB 0 CT1 27XBRD ANTBC (SUTURE) ×1 IMPLANT
SUT VIC AB 1 CT1 18XCR BRD 8 (SUTURE) ×1 IMPLANT
SUT VIC AB 2-0 CT1 18 (SUTURE) ×1 IMPLANT
SYR BULB IRRIG 60ML STRL (SYRINGE) ×1 IMPLANT
SYR CONTROL 10ML LL (SYRINGE) ×1 IMPLANT
TIP CONICAL INSTAFILL (ORTHOPEDIC DISPOSABLE SUPPLIES) IMPLANT
TOWEL GREEN STERILE (TOWEL DISPOSABLE) ×1 IMPLANT
TOWEL GREEN STERILE FF (TOWEL DISPOSABLE) ×1 IMPLANT
TRAY FOLEY MTR SLVR 16FR STAT (SET/KITS/TRAYS/PACK) ×1 IMPLANT
WATER STERILE IRR 1000ML POUR (IV SOLUTION) ×1 IMPLANT
YANKAUER SUCT BULB TIP NO VENT (SUCTIONS) ×1 IMPLANT

## 2023-10-09 NOTE — Brief Op Note (Signed)
 10/09/2023  1:07 PM  PATIENT:  Brianna [name]  33 y.o. 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

## 2023-10-09 NOTE — Transfer of Care (Signed)
 Immediate Anesthesia Transfer of Care Note  Patient: Brianna [name]  Procedure(s) Performed: TRANSFORAMINAL LUMBAR INTERBODY FUSION (TLIF) WITH PEDICLE SCREW FIXATION 1 LEVEL L4-5 (Spine Lumbar)  Patient Location: PACU  Anesthesia Type:General  Level of Consciousness: awake, drowsy, patient cooperative, and responds to stimulation  Airway & Oxygen Therapy: Patient Spontanous Breathing and Patient connected to nasal cannula oxygen  Post-op Assessment: Report given to RN, Post -op Vital signs reviewed and stable, and Patient moving all extremities X 4  Post vital signs: Reviewed and stable  Last Vitals:  Vitals Value Taken Time  BP    Temp    Pulse 118 10/09/23 1321  Resp 24 10/09/23 1321  SpO2 99 % 10/09/23 1321  Vitals shown include unfiled device data.  Last Pain:  Vitals:   10/09/23 0601  TempSrc:   PainSc: 0-No pain      Patients Stated Pain Goal: 0 (10/09/23 0601)  Complications: No notable events documented.

## 2023-10-09 NOTE — H&P (Signed)
 History: The patient is a 33 year old female who presents for pre-operative visit in preparation for their TLIF L4-5, which is scheduled on 10/09/23 with Dr. Venita Lick, MD at [facility]. The patient has had symptoms in the Back including pain which has impacted their quality of life and ability to do activities of daily living. The patient currently has a diagnosis of lumbar radiculopathy and has failed conservative treatments including activity modification. The patient has had no previous surgery . The patient denies an active infection.  Past Medical History:  Diagnosis Date   Anemia    Iron Deficiency Anemia   Diabetes mellitus without complication (HCC)    Hypertension    Hypothyroidism    Obesity    Pain    back pain, dx. herniated nucleus polpusos   Sleep apnea    Unable to tolerate CPAP   Thyroid disease     No Known Allergies  No current facility-administered medications on file prior to encounter.   Current Outpatient Medications on File Prior to Encounter  Medication Sig Dispense Refill   amLODipine (NORVASC) 10 MG tablet Take 1 tablet (10 mg total) by mouth daily. 90 tablet 1   Cinnamon 500 MG TABS Take 500 mg by mouth daily.     enalapril (VASOTEC) 20 MG tablet Take 20 mg by mouth daily.     gabapentin (NEURONTIN) 300 MG capsule Take 900 mg by mouth 3 (three) times daily.     levothyroxine (SYNTHROID) 75 MCG tablet TAKE 1 TABLET BY MOUTH EVERY DAY 90 tablet 0   magnesium oxide (MAG-OX) 400 (240 Mg) MG tablet Take 400 mg by mouth daily.     meloxicam (MOBIC) 15 MG tablet Take 15 mg by mouth daily.     metFORMIN (GLUCOPHAGE-XR) 500 MG 24 hr tablet TAKE 2 TABLETS BY MOUTH EVERY DAY WITH BREAKFAST 180 tablet 1   oxyCODONE-acetaminophen (PERCOCET/ROXICET) 5-325 MG tablet Take 1 tablet by mouth every 6 (six) hours as needed for severe pain (pain score 7-10). 15 tablet 0   blood glucose meter kit and supplies KIT Dispense based on patient and insurance  preference. Test blood sugar once daily. Diabetes type 2. E11.9 1 each 5   Lancets (FREESTYLE) lancets Use as instructed 100 each 5    Physical Exam: Vitals:   10/09/23 0608 10/09/23 0617  BP: (!) 151/100 127/80  Pulse: 96   Resp:    Temp:    SpO2:     Body mass index is 49.95 kg/m. General: AAOX3, well developed and well nourished, NAD  Ambulation abnormal uses no assitive devices  Heart: RRR.  Lungs: Normal pulmonary effort, no signs of respiratory distress  Abdomen: Normal bowel soundsX4, soft, non-tender, no hepatosplenomegaly.  Skin: No abnormal lesions, abrasions or contusions.  MSK: No obvious bony abnormalities, tenderness to palpation over lumbar spine  AROM Lumbar Spine: -Spine: normal ROM, pain elicited with fwd flexion and extension - Knee: flexion and extension normal and pain free bilaterally. - Ankle: Dorsiflexion, plantarflexion, inversion, eversion normal and pain free.  Dermatomes:Lower extremity sensation to light touch intact bilaterally with positive dysathesias on the right in the L5 and S1 dermatome distribution  Myotomes: - L2: Left 5/5, Right 5/5 -L3: Left 5/5, Right 5/5 - L4: Left 5/5, Right 3/5 - L5: Left 5/5, Right 2/5 - S1: Left 5/5, Right 2/5 Reflexes: - Patella: Left2+, Right 2+ Achilles: Left2+, Right 2+ - Babinski: Left Negative, Right Negative - Clonus: Negative  Special Tests: -  Straight Leg Raise: Left Negative, Right positive - FABER Test:Negative  PV: Extremities warm and well perfused. Posterior and dorsalis pedis pulse 2+ bilaterally, No pitting Edema, discoloration, calf tenderness, or palpable cords. Homan's negative bilaterally.  X-Ray impression:  3vLumbar: degenerative disease of L5-S1 with disc space narrowing noted.  The Lumbar MRI preformed on 08/08/2023 by [facility] showed a large recurrent paracentral disc herniation L4-5 to the right causing severe spinal stenosis. prior laminotomy noted at L4/5 on the  right  A/P:  Brianna [name] is a very pleasant 33 year old woman who had a two-level microdiscectomy in 2016 who presents now with a 1 month history of severe right radicular leg pain and a foot drop. X-rays of the lumbar spine demonstrate normal sagittal alignment. No scoliosis or spondylolisthesis. Updated lumbar MRI clearly shows a recurrent large right posterior lateral disc herniation at L4-5 with marked compression of the traversing L5 and to a lesser degree S1 nerve roots. Clinically she has a positive foot drop with marked weakness of the tibialis anterior and EHL. In addition there is weakness of her gastrocnemius but this may also be secondary to pain inhibition. The patient was scheduled for injection therapy but due to a significantly elevated A1c (9.1) the injection was postponed.  Patient is referred to me now for discussion about revision surgery. At this point given her morbid obesity and the size of the disc herniation and the fact she has had previous surgery I am recommending a TLIF at L4-5. Because of the prior surgery I do believe I will have to take an extensive amount of facet joint in order to remove the disc fragment. This would result in instability and therefore necessitate the fusion. I have gone over the potential risks of surgery with the patient and her mother. I did express the concern over the diabetes with elevated A1c which will increase the potential risk for wound healing complications and infection. At this point I told her that I would move forward with surgery in mid March since I do not have time available for the rest of this month and this gives her the opportunity to continue to improve her diabetic control. I have given her an AFO brace to begin using now and a prescription for an LSO brace which she will need after surgery.   Risks and benefits of posterior spinal fusion: Infection, bleeding, death, stroke, paralysis, ongoing or worse pain, need for additional surgery,  nonunion, leak of spinal fluid, adjacent segment degeneration requiring additional fusion surgery, Injury to abdominal vessels that can require anterior surgery to stop bleeding. Malposition of the cage and/or pedicle screws that could require additional surgery. Loss of bowel and bladder control. Postoperative hematoma causing neurologic compression that could require urgent or emergent re-operation.  Risks and benefits of lumbar decompression/discectomy: Infection, bleeding, death, stroke, paralysis, ongoing or worse pain, need for additional surgery, leak of spinal fluid, adjacent segment degeneration requiring additional surgery, post-operative hematoma formation that can result in neurological compromise and the need for urgent/emergent re-operation. Loss in bowel and bladder control. Injury to major vessels that could result in the need for urgent abdominal surgery to stop bleeding. Risk of deep venous thrombosis (DVT) and the need for additional treatment.

## 2023-10-09 NOTE — Plan of Care (Signed)
  Problem: Education: Goal: Knowledge of General Education information will improve Description: Including pain rating scale, medication(s)/side effects and non-pharmacologic comfort measures Outcome: Progressing   Problem: Health Behavior/Discharge Planning: Goal: Ability to manage health-related needs will improve Outcome: Progressing   Problem: Clinical Measurements: Goal: Ability to maintain clinical measurements within normal limits will improve Outcome: Progressing Goal: Will remain free from infection Outcome: Progressing Goal: Diagnostic test results will improve Outcome: Progressing Goal: Respiratory complications will improve Outcome: Progressing Goal: Cardiovascular complication will be avoided Outcome: Progressing   Problem: Activity: Goal: Risk for activity intolerance will decrease Outcome: Progressing   Problem: Nutrition: Goal: Adequate nutrition will be maintained Outcome: Progressing   Problem: Pain Managment: Goal: General experience of comfort will improve and/or be controlled Outcome: Progressing

## 2023-10-09 NOTE — Anesthesia Postprocedure Evaluation (Signed)
 Anesthesia Post Note  Patient: Brianna [name]  Procedure(s) Performed: TRANSFORAMINAL LUMBAR INTERBODY FUSION (TLIF) WITH PEDICLE SCREW FIXATION 1 LEVEL L4-5 (Spine Lumbar)     Patient location during evaluation: PACU Anesthesia Type: General Level of consciousness: awake and alert Pain management: pain level controlled Vital Signs Assessment: post-procedure vital signs reviewed and stable Respiratory status: spontaneous breathing, nonlabored ventilation, respiratory function stable and patient connected to nasal cannula oxygen Cardiovascular status: blood pressure returned to baseline and stable Postop Assessment: no apparent nausea or vomiting Anesthetic complications: no  No notable events documented.  Last Vitals:  Vitals:   10/09/23 1335 10/09/23 1345  BP:  122/85  Pulse: (!) 109 (!) 105  Resp: 12 10  Temp:    SpO2: 98% 99%    Last Pain:  Vitals:   10/09/23 1345  TempSrc:   PainSc: Asleep                 Lennie Dunnigan,W. EDMOND

## 2023-10-09 NOTE — Anesthesia Procedure Notes (Signed)
 Procedure Name: Intubation Date/Time: 10/09/2023 7:43 AM  Performed by: Ayesha Rumpf, CRNAPre-anesthesia Checklist: Patient identified, Emergency Drugs available, Suction available and Patient being monitored Patient Re-evaluated:Patient Re-evaluated prior to induction Oxygen Delivery Method: Circle System Utilized Preoxygenation: Pre-oxygenation with 100% oxygen Induction Type: IV induction, Cricoid Pressure applied and Rapid sequence Laryngoscope Size: Glidescope and 3 Grade View: Grade I Tube type: Oral Tube size: 7.0 mm Number of attempts: 1 Airway Equipment and Method: Stylet and Oral airway Placement Confirmation: ETT inserted through vocal cords under direct vision, positive ETCO2 and breath sounds checked- equal and bilateral Secured at: 21 cm Tube secured with: Tape Dental Injury: Teeth and Oropharynx as per pre-operative assessment  Comments: Mask ventilation not attempted.  RSI secondary to GLP-1 Tx, PMH, habitus.

## 2023-10-09 NOTE — Op Note (Signed)
 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  7 x 9 x 24.  Expanded to an anterior height of 11.5 mm and posterior height of 9 mm for 17 degrees of lordosis.  MIS NuVasive pedicle screws.  6.5 x 40 mm length.  40 mm length rod.  Neuromonitoring: No adverse free running EMG.  All 4 pedicle screws were directly stimulated and there is no adverse activity at greater than 40 mA.  Graft: Autograft from decompression mixed with DBX.  BRIEF HISTORY: Brianna [name] is a 33 y.o. female who had a prior lumbar discectomy right side 2016.  She has a recurrent large disc herniation on the right side at L4-5 with right L5 radiculopathy (motor and sensory deficits).  Imaging confirmed a recurrent disc herniation.  Patient is referred to me for definitive fixation.  Given the size of the disc herniation the need for more extensive decompression I elected to move forward with a fusion.  All appropriate risks, benefits, alternatives were explained to the patient and consent was obtained.  PROCEDURE DETAILS: Patient was brought into the operating room and was properly positioned on the operating room table.  After induction with general anesthesia the patient was endotracheally intubated.  A timeout was taken to confirm all important data: including patient, procedure, and the level. Teds, SCD's were applied.   Foley was placed by the nurse and the neuromonitoring representative problem placed all appropriate  needles and pads for intraoperative SSEP and EMG monitoring.  The patient was turned gently into the prone position and all bony prominences were well-padded.  The back was now prepped and draped in a standard fashion.    Using fluoroscopy I marked out the lateral border of the L4 and L5 pedicle on the left side.  I then infiltrated this area with quarter percent Marcaine with epinephrine.  Half-inch incision was made and a Jamshidi needle was advanced percutaneously deep fascia to the lateral border of the L4-5 facet complex.  Using AP fluoroscopy I confirmed that I was in the proper position on the lateral aspect of the facet.  I then seen the trocar.  Into the L5 pedicle.  Indications for monitoring of progress with AP fluoroscopy I was also directly stimulated.  As I neared the medial lateral pedicle was seen on the AP view I switched to the lateral view to confirm that the Jamshidi needle was just beyond the posterior wall of the vertebral body. once I confirm satisfactory trajectory and position I then advanced into the vertebral body.  The pedicle was then cannulated with a guidepin and the trocar was removed.  Using this exact same technique I cannulated the L4 pedicle.  I confirmed satisfactory positioning of 2 guidepins in both planes.  With the left L4 and L5 pedicles and get the lidocaine at the cephalad lateral side to begin the approach.  I did again identify the lateral border of short pedicles and marked out my incision.  This was infiltrated with quarter percent Marcaine.  The will still decision denying  pain in place.  Dissection was carried out down through the extensive deep tissue.  I eventually was able to palpate the deep fascia.  I incised fascia and then bluntly dissected through the paraspinal muscles until I could palpate lateral aspect of the L3-4 and L4-5 facet complexes.  Once I was able to palpate this area I placed my Jamshidi needle on the lateral aspect of the L3-4 facet and  confirmed satisfactory position with AP fluoroscopy I then advanced the Jamshidi needle in the same technique I used on the contralateral side until I was nearing the medial wall of the pedicle.  I then confirmed on the lateral view that my trajectory was correct and that I was just beyond the posterior wall the vertebral body.  I advanced into the L5 for vertebral body and then cannulated the pedicle using the same technique I cannulated the L5 pedicle.  I then measured and then tapped and placed a 6.5 x 40 mm length screw with the attached retractor blade at each level.  I then established my retractor so I can visualize the posterior aspect of the spine I then mobilized the remaining paraspinal muscles medially and placed my medial retractor.  I could now visualize the L3-4 and L4-5 facet complexes.  I used at curette to begin mobilizing the extensive scar tissue from her prior surgery.  I ultimately was able to palpate the intact portion spinous process of L4 and I began resecting and mobilized the scar tissue until expose the majority of the L4 spinous process and the intact portion of the L4 lamina as well as the L4 pars.  I could also now visualize the L4-5 facet capsule.  Using a Bovie I remove the entire capsule of the facet at L4-5 and then I used an osteotome to resect the majority of the inferior L4 facet.  I then used Kerrison rongeurs to resect the lateral two thirds of the pars of L4.  I could now visualize the superior portion of the L5 pedicle.  There was a significant amount of scar tissue which I gently began resecting through.  I ultimately was able to create a plane between the lamina and scar tissue and there was performed a more generous laminotomy of L4.  I continued into the lateral recess till I could visualize the medial portion of the L5 pedicle.  The L5 nerve root was now visible.  I gently mobilized the nerve root and then identified the epidural veins and coagulated I could now begin  to mobilize the thecal sac gently.  There was still quite adherent.  I was able to resect the remaining portion of the pars and ultimately identify the L4 nerve root.  I now had excellent visualization of the posterior annulus.  With my exposure satisfactory I performed an annulotomy and resected bulk of the disc material.  This was accomplished with curettes and pituitary rongeurs.  As I removed the disc material I was able to use an Epstein curette to begin to resect the disc herniation in the lateral recess.  Using a fine nerve hook I developed a plane between the thecal sac and the nerve root and I began mobilizing fragments of disc out should be noted that it was quite adherent to the undersurface of the nerve root.  I used my Epstein curettes to continue to remove the osteophyte and disc fragments from the ventral surface of the thecal sac.  At this point I could now mobilize the L5 nerve  root.  He was no longer under significant tension.  Formed a L5 foraminotomy with a Kerrison rongeur to further decompress the nerve.  Using my curettes to resect all of the disc material until I had the cartilaginous serial from the disc base.  I then trialed and confirmed satisfactory positioning.  With the intervertebral discectomy complete I continued to work on the remaining scar tissue and disc material.  I swept circumferentially under the thecal sac and L5 nerve root and confirmed that I had an adequate decompression.  I also was able to pass my nerve hook superiorly to the L4 pedicle and along the path of the L4 nerve root.  Both the L4 and L5 nerve roots were now no longer under compression.  The discectomy was complete and there was no signs of a CSF leak.  At this point is quite pleased with the overall impression given the patient's morbid obesity and the extensive amount of scar tissue that had formed.  I irrigated the wound copiously with normal saline and then placed autograft in the anterior and lateral  aspect of the disc space.  I then placed the cage and gently advanced it to the appropriate depth.  I expanded the anterior and posterior portions until I had excellent contact with the endplates.  I then backfilled the cage with DBX.  Imaging demonstrated satisfactory positioning of the intervertebral cage in both the AP and lateral planes.  The polyaxial screws was removed.  I measured and placed a 40 mm rod.  This was locked in place with the locking caps.  Both locking caps were then tightened/torqued to manufacture standards.  I then reexposed the area irrigated again and placed Floseal to aid in hemostasis.  I then went back over to the left side and measured and placed the 6.5 mm pedicle screws over the guidepins.  Both screws had excellent purchase on the left side as they had on the right.  I then wired the endplates the 40 mm length rod and locked it with the locking caps.  Final imaging demonstrated satisfactory positioning of the intervertebral cage as well as all 4 pedicle screws.  The insertion tabs were broken off and removed.  I then reirrigated the right wound and exposed the decompression site final irrigation and confirmation of hemostasis I checked 1 more time with my number for confirming satisfactory decompression centrally and motion of the L4 and L5 nerve roots freely.  The deep fascia of this wound was closed in a layered fashion with a #1 strata fix.  Superficial was closed with interrupted 2-0 Vicryl suture and finally a 3-0 Monocryl for skin.  On the left side both of these small incisions were irrigated and closed in a layered fashion with 3-0 Vicryl followed by interrupted 2-0 Vicryl suture and finally 3-0 Monocryl.  Steri-Strips and a dry dressing were applied.  Patient was ultimately extubated transfer the PACU without incident.  The end of the case all needle sponge counts were correct.  Venita Lick, MD 10/09/2023 12:46 PM

## 2023-10-09 NOTE — Discharge Instructions (Signed)

## 2023-10-10 ENCOUNTER — Encounter (HOSPITAL_COMMUNITY): Payer: Self-pay | Admitting: Orthopedic Surgery

## 2023-10-10 LAB — GLUCOSE, CAPILLARY
Glucose-Capillary: 134 mg/dL — ABNORMAL HIGH (ref 70–99)
Glucose-Capillary: 164 mg/dL — ABNORMAL HIGH (ref 70–99)
Glucose-Capillary: 126 mg/dL — ABNORMAL HIGH (ref 70–99)
Glucose-Capillary: 118 mg/dL — ABNORMAL HIGH (ref 70–99)

## 2023-10-10 NOTE — Evaluation (Signed)
 Occupational Therapy Evaluation Patient Details Name: Brianna [name] MRN: [id number] DOB: 27-Oct-1990 Today's Date: 10/10/2023   History of Present Illness   Brianna [name] is a 33 y.o. female presenting s/p TLIF at L4-5 on 10/09/2023. PMHx includes HTN, DM, hypothyroidism, back pain, anemia, sleep apnea, thyroid disease, R foot drop     Clinical Impressions Pt evaluated s/p the admission list above. 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 Upon evaluation, pt was limited by lower back pain/discomfort, knowledge of back precautions, compensatory strategies, and use of adaptive equipment. Overall, pt required MIN A for bed mobility and CGA for functional mobility tasks using AD. Pt provided with skilled instruction and visual demonstration of AE to improve pt independence in lower body ADLs. Pt with great carryover. Based on evaluation, pt will require up to supervision for ADLs using AE. Pt does not have acute OT needs. Pt has a supportive family. Recommends pt discharge home with family support once medically stable without follow-up OT services.      If plan is discharge home, recommend the following:   A little help with walking and/or transfers;A little help with bathing/dressing/bathroom;Assistance with cooking/housework;Assist for transportation;Help with stairs or ramp for entrance     Functional Status Assessment   Patient has had a recent decline in their functional status and demonstrates the ability to make significant improvements in function in a reasonable and predictable amount of time.     Equipment Recommendations   BSC/3in1;Other (comment) (RW)     Recommendations for Other Services         Precautions/Restrictions   Precautions Precautions: Back;Fall Precaution Booklet Issued:  Yes (comment) Recall of Precautions/Restrictions: Intact Precaution/Restrictions Comments: Pt educated on back precautions; Required Braces or Orthoses: Spinal Brace Spinal Brace: Lumbar corset;Applied in sitting position Restrictions Weight Bearing Restrictions Per Provider Order: No     Mobility Bed Mobility Overal bed mobility: Needs Assistance Bed Mobility: Rolling, Sidelying to Sit Rolling: Contact guard assist, Used rails Sidelying to sit: Min assist, Used rails       General bed mobility comments: Pt educated on log roll technique to safely complete bed mobility tasks. Pt required MIN A to facilitate trunk to upright position.    Transfers Overall transfer level: Needs assistance Equipment used: Rolling walker (2 wheels) Transfers: Sit to/from Stand Sit to Stand: Contact guard assist, From elevated surface           General transfer comment: CGA provided for safety. Bed elevated to increase pt independence. Pt required verbal cues for B hand placement and increased time for transition due to lower back pain.      Balance Overall balance assessment: Needs assistance Sitting-balance support: Feet supported Sitting balance-Leahy Scale: Good Sitting balance - Comments: Pt engaged in lower body dressing task while seated EOB   Standing balance support: Bilateral upper extremity supported, During functional activity Standing balance-Leahy Scale: Fair Standing balance comment: Pt completed functional mobility in room and hallway using RW                           ADL either performed or assessed with clinical judgement   ADL Overall ADL's : Needs assistance/impaired Eating/Feeding: Independent   Grooming: Supervision/safety;Standing   Upper Body Bathing: Supervision/ safety;Sitting   Lower Body Bathing:  Supervison/ safety;Adhering to back precautions;Sit to/from stand;Sitting/lateral leans;With adaptive equipment   Upper Body Dressing :  Supervision/safety;Sitting   Lower Body Dressing: With adaptive equipment;Adhering to hip precautions;Sit to/from stand;Supervision/safety   Toilet Transfer: Contact guard assist;Ambulation;Regular Toilet;Rolling walker (2 wheels)   Toileting- Clothing Manipulation and Hygiene: Sit to/from stand;Adhering to back precautions;Supervision/safety       Functional mobility during ADLs: Contact guard assist;Rolling walker (2 wheels) General ADL Comments: Pt completed functional mobility using RW with up to CGA  for safety due to R foot drop. No LOB observed. Pt educated on use of AE to improve pt independence in lower body dressing tasks. Pt with great carryover of AE instruction and back precautions     Vision Ability to See in Adequate Light: 0 Adequate Patient Visual Report: No change from baseline Vision Assessment?: No apparent visual deficits     Perception Perception: Within Functional Limits       Praxis Praxis: Not tested       Pertinent Vitals/Pain Pain Assessment Pain Assessment: Faces Faces Pain Scale: Hurts little more Pain Location: lower back (incision site) Pain Descriptors / Indicators: Discomfort, Aching, Sore Pain Intervention(s): Monitored during session, Premedicated before session     Extremity/Trunk Assessment Upper Extremity Assessment Upper Extremity Assessment: Overall WFL for tasks assessed   Lower Extremity Assessment Lower Extremity Assessment: Defer to PT evaluation   Cervical / Trunk Assessment Cervical / Trunk Assessment: Back Surgery   Communication Communication Communication: No apparent difficulties   Cognition Arousal: Alert Behavior During Therapy: WFL for tasks assessed/performed Cognition: No apparent impairments             OT - Cognition Comments: Pt able to recall back precautions and adhere functionally. Pt able follow multi-step verbal commands and express wants/needs                 Following commands: Intact        Cueing  General Comments   Cueing Techniques: Verbal cues  VSS on RA; boyfriend at bedside   Exercises     Shoulder Instructions      Home Living Family/patient expects to be discharged to:: Private residence Living Arrangements: Parent Available Help at Discharge: Family;Available 24 hours/day Type of Home: House Home Access: Stairs to enter [employer] of Steps: 2 Entrance Stairs-Rails: Left Home Layout: One level;Other (Comment) (1/2 step down and incline up to one level home)     Bathroom Shower/Tub: Chief Strategy Officer: Standard     Home Equipment: Agricultural consultant (2 wheels);Adaptive equipment Adaptive Equipment: Reacher;Long-handled sponge Additional Comments: Pt reported having an older RW at home.      Prior Functioning/Environment Prior Level of Function : Needs assist;Working/employed       Physical Assist : ADLs (physical)   ADLs (physical): Dressing Mobility Comments: Pt reports not using AD for functional mobility at baseline ADLs Comments: Pt mother assisted pt with donning socks and shoes at baseline. Pt works as an International aid/development worker at [employer] Problem List: Decreased strength;Decreased activity tolerance;Impaired balance (sitting and/or standing);Decreased knowledge of use of DME or AE;Decreased knowledge of precautions   OT Treatment/Interventions:        OT Goals(Current goals can be found in the care plan section)   Acute Rehab OT Goals Patient Stated Goal: none stated OT Goal Formulation: With patient Time For Goal Achievement: 10/10/23 Potential to Achieve Goals: Good   OT Frequency:       Co-evaluation  AM-PAC OT "6 Clicks" Daily Activity     Outcome Measure Help from another person eating meals?: None Help from another person taking care of personal grooming?: A Little Help from another person toileting, which includes using toliet, bedpan, or urinal?: A Little Help  from another person bathing (including washing, rinsing, drying)?: A Little Help from another person to put on and taking off regular upper body clothing?: A Little Help from another person to put on and taking off regular lower body clothing?: A Little 6 Click Score: 19   End of Session Equipment Utilized During Treatment: Gait belt;Rolling walker (2 wheels);Back brace Nurse Communication: Mobility status  Activity Tolerance: Patient tolerated treatment well Patient left: with family/visitor present;Other (comment) (on commode)  OT Visit Diagnosis: Unsteadiness on feet (R26.81);Other abnormalities of gait and mobility (R26.89);Muscle weakness (generalized) (M62.81)                Time: 0454-0981 OT Time Calculation (min): 30 min Charges:  OT General Charges $OT Visit: 1 Visit OT Evaluation $OT Eval Low Complexity: 1 Low OT Treatments $Self Care/Home Management : 8-22 mins  Kevan Ny, MOTS  Burnard Enis 10/10/2023, 11:11 AM

## 2023-10-10 NOTE — Evaluation (Signed)
 Physical Therapy Evaluation Patient Details Name: Brianna [name] MRN: [id number] DOB: 1991/04/13 Today's Date: 10/10/2023  History of Present Illness  Brianna [name] is a 33 y.o. female presenting s/p TLIF at L4-5 on 10/09/2023. PMHx includes HTN, DM, hypothyroidism, back pain, anemia, sleep apnea, thyroid disease, R foot drop  Clinical Impression  PTA pt living with parents and 2 yo son in single story home with 2 steps to enter. Pt independent in mobility despite R LE foot drop, pt's mother assists with shoes and socks. Pt currently limited in safe mobility by pain and decreased AROM R ankle and impaired sensation below R knee. Pt is modA for bed mobility, min A for transfers, supervision for ambulation with RW and min A for stair training. Per OT an AFO has been ordered to assist pt with safety in ambulation due to her continued R foot drop. Pt educated on car transfers. Pt will have no further PT needs at discharge. PT will continue to follow acutely for progressing mobility and AFO training.      If plan is discharge home, recommend the following: A little help with walking and/or transfers;A little help with bathing/dressing/bathroom;Assistance with cooking/housework;Assist for transportation;Help with stairs or ramp for entrance   Can travel by private vehicle    Yes    Equipment Recommendations Rolling walker (2 wheels);BSC/3in1;Other (comment) (AFO)     Functional Status Assessment Patient has had a recent decline in their functional status and demonstrates the ability to make significant improvements in function in a reasonable and predictable amount of time.     Precautions / Restrictions Precautions Precautions: Back;Fall Precaution Booklet Issued: Yes (comment) Recall of Precautions/Restrictions: Intact Precaution/Restrictions Comments: Pt educated on back precautions; Required Braces or Orthoses: Spinal Brace Spinal Brace: Lumbar corset;Applied in sitting  position Restrictions Weight Bearing Restrictions Per Provider Order: No      Mobility  Bed Mobility Overal bed mobility: Needs Assistance Bed Mobility: Rolling, Sidelying to Sit Rolling: Contact guard assist, Used rails Sidelying to sit: Used rails, Mod assist       General bed mobility comments: pt with good log rolling technique, cuing to try to have LE lower as she is pushing trunk to upright to decrease stress on back, ultimately requires mod A for bringing trunk to upright due to back pain    Transfers Overall transfer level: Needs assistance Equipment used: Rolling walker (2 wheels) Transfers: Sit to/from Stand Sit to Stand: Contact guard assist, From elevated surface           General transfer comment: With bed elevated and increased cuing for hand placement pt able to push up to standing and steadying in RW    Ambulation/Gait Ambulation/Gait assistance: Supervision Gait Distance (Feet): 150 Feet Assistive device: Rolling walker (2 wheels) Gait Pattern/deviations: Step-through pattern, Decreased dorsiflexion - right, Decreased weight shift to right, Decreased stance time - right Gait velocity: slowed Gait velocity interpretation: <1.31 ft/sec, indicative of household ambulator   General Gait Details: after surgery pt continues to have R foot drop, vc for increased knee and ankle flexion for safe advancement of R LE in swing phase, per OT AFO has been ordered  Stairs Stairs: Yes Stairs assistance: Min assist Stair Management: One rail Left, Backwards, Forwards, Step to pattern Number of Stairs: 1 (x3) General stair comments: pt with rail on L at home, pt requires min A for stepping up and back from one step x3, vc for sequencing for weaker R foot, provided pt with gait belt  for family to provide assist on R side going into house        Balance Overall balance assessment: Needs assistance Sitting-balance support: Feet supported Sitting balance-Leahy Scale:  Good Sitting balance - Comments: Pt engaged in lower body dressing task while seated EOB   Standing balance support: Bilateral upper extremity supported, During functional activity Standing balance-Leahy Scale: Fair Standing balance comment: Pt completed functional mobility in room and hallway using RW                             Pertinent Vitals/Pain Pain Assessment Pain Assessment: Faces Faces Pain Scale: Hurts little more Pain Location: lower back (incision site) Pain Descriptors / Indicators: Discomfort, Aching, Sore Pain Intervention(s): Limited activity within patient's tolerance, Monitored during session, Repositioned    Home Living Family/patient expects to be discharged to:: Private residence Living Arrangements: Parent Available Help at Discharge: Family;Available 24 hours/day Type of Home: House Home Access: Stairs to enter Entrance Stairs-Rails: Left Entrance Stairs-Number of Steps: 2   Home Layout: One level;Other (Comment) (1/2 step down and incline up to one level home) Home Equipment: Rolling Walker (2 wheels);Adaptive equipment Additional Comments: Pt reported having an older RW at home.    Prior Function Prior Level of Function : Needs assist;Working/employed       Physical Assist : ADLs (physical)   ADLs (physical): Dressing Mobility Comments: Pt reports not using AD for functional mobility at baseline ADLs Comments: Pt mother assisted pt with donning socks and shoes at baseline. Pt works as an International aid/development worker at [employer] Extremity Assessment: Defer to OT evaluation    Lower Extremity Assessment Lower Extremity Assessment: RLE deficits/detail;LLE deficits/detail RLE Deficits / Details: PROM WFL, hip and knee strength 4-/5, ankle plantar flexion 3+/5 and ankle dorsiflexion 2+/5 RLE Sensation: decreased light touch;decreased proprioception RLE Coordination:  decreased fine motor LLE Deficits / Details: generalized weakness, ROM WFL    Cervical / Trunk Assessment Cervical / Trunk Assessment: Back Surgery  Communication   Communication Communication: No apparent difficulties    Cognition Arousal: Alert Behavior During Therapy: WFL for tasks assessed/performed                             Following commands: Intact       Cueing Cueing Techniques: Verbal cues     General Comments General comments (skin integrity, edema, etc.): VSS on RA, boyfriend in room at end of session        Assessment/Plan    PT Assessment Patient needs continued PT services  PT Problem List Decreased strength;Decreased range of motion;Decreased activity tolerance;Decreased balance;Decreased mobility;Decreased coordination;Impaired sensation;Pain       PT Treatment Interventions DME instruction;Gait training;Stair training;Functional mobility training;Balance training;Patient/family education    PT Goals (Current goals can be found in the Care Plan section)  Acute Rehab PT Goals PT Goal Formulation: With patient Time For Goal Achievement: 10/24/23 Potential to Achieve Goals: Good    Frequency Min 5X/week        AM-PAC PT "6 Clicks" Mobility  Outcome Measure Help needed turning from your back to your side while in a flat bed without using bedrails?: None Help needed moving from lying on your back to sitting on the side of a flat bed without using bedrails?: A Lot Help needed moving to and from a bed to  a chair (including a wheelchair)?: A Little Help needed standing up from a chair using your arms (e.g., wheelchair or bedside chair)?: A Little Help needed to walk in hospital room?: None Help needed climbing 3-5 steps with a railing? : A Little 6 Click Score: 19    End of Session Equipment Utilized During Treatment: Gait belt;Back brace Activity Tolerance: Patient limited by pain Patient left: with call bell/phone within reach  (sitting EoB with boyfriend in room) Nurse Communication: Mobility status PT Visit Diagnosis: Muscle weakness (generalized) (M62.81);Other symptoms and signs involving the nervous system (R29.898);Other abnormalities of gait and mobility (R26.89);Pain Pain - part of body:  (back)    Time: 4132-4401 PT Time Calculation (min) (ACUTE ONLY): 28 min   Charges:   PT Evaluation $PT Eval Moderate Complexity: 1 Mod PT Treatments $Gait Training: 8-22 mins PT General Charges $$ ACUTE PT VISIT: 1 Visit         Adrianah Prophete B. Beverely Risen PT, DPT Acute Rehabilitation Services Please use secure chat or  Call Office [phone]   Elon Alas [facility] 10/10/2023, 12:05 PM

## 2023-10-10 NOTE — Progress Notes (Signed)
    Subjective: Procedure(s) (LRB): TRANSFORAMINAL LUMBAR INTERBODY FUSION (TLIF) WITH PEDICLE SCREW FIXATION 1 LEVEL L4-5 (N/A) 1 Day Post-Op  Patient reports pain as mild.  Reports decreased leg pain denies incisional back pain   Positive void Negative bowel movement Negative flatus Negative chest pain or shortness of breath  Objective: Vital signs in last 24 hours: Temp:  [99.3 F (37.4 C)-100.6 F (38.1 C)] 100.1 F (37.8 C) (03/18 0340) Pulse Rate:  [97-119] 100 (03/17 2302) Resp:  [10-20] 20 (03/18 0340) BP: (104-136)/(60-88) 119/64 (03/18 0340) SpO2:  [98 %-100 %] 98 % (03/18 0340)  Intake/Output from previous day: 03/17 0701 - 03/18 0700 In: 3100 [P.O.:720; I.V.:2000; IV Piggyback:380] Out: 1050 [Urine:800; Blood:250]  Labs: No results for input(s): "WBC", "RBC", "HCT", "PLT" in the last 72 hours. No results for input(s): "NA", "K", "CL", "CO2", "BUN", "CREATININE", "GLUCOSE", "CALCIUM" in the last 72 hours. No results for input(s): "LABPT", "INR" in the last 72 hours.  Physical Exam: Neurovascular intact Sensation intact distally Intact pulses distally Incision: dressing C/D/I Compartment soft Right foot drop still present 3/5 strength.  Plantar and dorsiflexion intact on left foot Body mass index is 49.95 kg/m.   Assessment/Plan:  Assessment: Mindy is a very pleasant 33 year old female who is POD 1 from TLIF L4-L5. Overall, the surgery was without complications and was successful. Her hospital course thus far has been uncomplicated. She states that her pre-operative right leg pain has improved, she states that she does still have her right pre-operative foot drop. However, the patient does state that she believes that her foot drop is improving. Patient is tolerating oral intake well. Ambulating independently. Positive void. Negative flatus or BM.    Plan: Continue mobilization with PT Continue incentive spirometry  Continue pain control  Continue  care  Plan to D/c this afternoon or tomorrow pending PT clearance, positive flatus, and patient comfort.    [employer]  PA-C [facility] [phone]

## 2023-10-11 LAB — GLUCOSE, CAPILLARY: Glucose-Capillary: 116 mg/dL — ABNORMAL HIGH (ref 70–99)

## 2023-10-11 MED ORDER — DIPHENHYDRAMINE HCL 25 MG PO CAPS
25.0000 mg | ORAL_CAPSULE | Freq: Four times a day (QID) | ORAL | Status: DC | PRN
  Administered 2023-10-11: 25 mg via ORAL
  Filled 2023-10-11: qty 1

## 2023-10-11 NOTE — Progress Notes (Signed)
 Physical Therapy Treatment Patient Details Name: Brianna [name] MRN: [id number] DOB: 10-12-1990 Today's Date: 10/11/2023   History of Present Illness Brianna [name] is a 33 y.o. female presenting s/p TLIF at L4-5 on 10/09/2023. PMHx includes HTN, DM, hypothyroidism, back pain, anemia, sleep apnea, thyroid disease, R foot drop    PT Comments  Pt is up in recliner on entry, reporting that she has already ambulated in hallway with AFO. PT assisted with donning AFO. PT and pt agree that shoes she has are not optimal and that she will put the AFO in a pair of lace up shoes when she goes home. Pt ambulates in hallway with supervision. Pt has better foot clearance and rocker stages of gait. Pt also able to step up and down step to simulated getting in home with support on her R side. Pt feels confident she can tell her father how to assist. Pt looking forward to discharge home this afternoon.    If plan is discharge home, recommend the following: A little help with walking and/or transfers;A little help with bathing/dressing/bathroom;Assistance with cooking/housework;Assist for transportation;Help with stairs or ramp for entrance   Can travel by private vehicle      Yes  Equipment Recommendations  Rolling walker (2 wheels);BSC/3in1;Other (comment) (AFO)       Precautions / Restrictions Precautions Precautions: Back;Fall Precaution Booklet Issued: Yes (comment) Recall of Precautions/Restrictions: Intact Precaution/Restrictions Comments: Pt educated on back precautions; Required Braces or Orthoses: Spinal Brace;Other Brace Spinal Brace: Lumbar corset;Applied in sitting position Other Brace: R AFO Restrictions Weight Bearing Restrictions Per Provider Order: No     Mobility  Bed Mobility               General bed mobility comments: up in chair on entry    Transfers Overall transfer level: Needs assistance Equipment used: Rolling walker (2 wheels) Transfers: Sit to/from  Stand Sit to Stand: Contact guard assist, From elevated surface           General transfer comment: good power up and steadying from recliner    Ambulation/Gait Ambulation/Gait assistance: Supervision Gait Distance (Feet): 200 Feet Assistive device: Rolling walker (2 wheels) Gait Pattern/deviations: Step-through pattern, Decreased dorsiflexion - right, Decreased weight shift to right, Decreased stance time - right Gait velocity: slowed Gait velocity interpretation: <1.31 ft/sec, indicative of household ambulator   General Gait Details: R foot clearance improved by AFO. pt reports feeling a little better   Stairs Stairs: Yes Stairs assistance: Min assist Stair Management: One rail Left, Backwards, Forwards, Step to pattern   General stair comments: PT provided support on R and pt used L hand rail with strong step up and back x4         Balance Overall balance assessment: Needs assistance Sitting-balance support: Feet supported Sitting balance-Leahy Scale: Good Sitting balance - Comments: Pt engaged in lower body dressing task while seated EOB   Standing balance support: Bilateral upper extremity supported, During functional activity Standing balance-Leahy Scale: Fair Standing balance comment: Pt completed functional mobility in room and hallway using RW                            Communication Communication Communication: No apparent difficulties  Cognition Arousal: Alert Behavior During Therapy: [facility] for tasks assessed/performed                             Following commands: Intact  Cueing Cueing Techniques: Verbal cues     General Comments General comments (skin integrity, edema, etc.): VSS on RA      Pertinent Vitals/Pain Pain Assessment Pain Assessment: Faces Faces Pain Scale: Hurts a little bit Pain Location: lower back (incision site) Pain Descriptors / Indicators: Discomfort, Aching, Sore Pain Intervention(s): Limited  activity within patient's tolerance, Monitored during session, Repositioned     PT Goals (current goals can now be found in the care plan section) Acute Rehab PT Goals PT Goal Formulation: With patient Time For Goal Achievement: 10/24/23 Potential to Achieve Goals: Good Progress towards PT goals: Progressing toward goals    Frequency    Min 5X/week       AM-PAC PT "6 Clicks" Mobility   Outcome Measure  Help needed turning from your back to your side while in a flat bed without using bedrails?: None Help needed moving from lying on your back to sitting on the side of a flat bed without using bedrails?: A Lot Help needed moving to and from a bed to a chair (including a wheelchair)?: A Little Help needed standing up from a chair using your arms (e.g., wheelchair or bedside chair)?: A Little Help needed to walk in hospital room?: None Help needed climbing 3-5 steps with a railing? : A Little 6 Click Score: 19    End of Session Equipment Utilized During Treatment: Gait belt;Back brace Activity Tolerance: Patient limited by pain Patient left: with call bell/phone within reach;in chair Nurse Communication: Mobility status PT Visit Diagnosis: Muscle weakness (generalized) (M62.81);Other symptoms and signs involving the nervous system (R29.898);Other abnormalities of gait and mobility (R26.89);Pain Pain - part of body:  (back)     Time: 7846-9629 PT Time Calculation (min) (ACUTE ONLY): 17 min  Charges:    $Gait Training: 8-22 mins PT General Charges $$ ACUTE PT VISIT: 1 Visit                     Kento Gossman B. Beverely Risen PT, DPT Acute Rehabilitation Services Please use secure chat or  Call Office [phone]    Elon Alas [facility] 10/11/2023, 1:55 PM

## 2023-10-11 NOTE — Discharge Summary (Cosign Needed)
 Patient ID: Brianna [name] MRN: [id number] DOB/AGE: Sep 22, 1990 33 y.o.  Admit date: 10/09/2023 Discharge date: 10/11/2023  Admission Diagnoses:  Principal Problem:   S/P lumbar fusion   Discharge Diagnoses:  Principal Problem:   S/P lumbar fusion  status post Procedure(s): TRANSFORAMINAL LUMBAR INTERBODY FUSION (TLIF) WITH PEDICLE SCREW FIXATION 1 LEVEL L4-5  Past Medical History:  Diagnosis Date   Anemia    Iron Deficiency Anemia   Diabetes mellitus without complication (HCC)    Hypertension    Hypothyroidism    Obesity    Pain    back pain, dx. herniated nucleus polpusos   Sleep apnea    Unable to tolerate CPAP   Thyroid disease     Surgeries: Procedure(s): TRANSFORAMINAL LUMBAR INTERBODY FUSION (TLIF) WITH PEDICLE SCREW FIXATION 1 LEVEL L4-5 on 10/09/2023   Consultants:   Discharged Condition: Improved  Hospital Course: Brianna [name] is an 33 y.o. female who was admitted 10/09/2023 for operative treatment of S/P lumbar fusion. Patient failed conservative treatments (please see the history and physical for the specifics) and had severe unremitting pain that affects sleep, daily activities and work/hobbies. After pre-op clearance, the patient was taken to the operating room on 10/09/2023 and underwent  Procedure(s): TRANSFORAMINAL LUMBAR INTERBODY FUSION (TLIF) WITH PEDICLE SCREW FIXATION 1 LEVEL L4-5.    Patient was given perioperative antibiotics:  Anti-infectives (From admission, onward)    Start     Dose/Rate Route Frequency Ordered Stop   10/09/23 1430  ceFAZolin (ANCEF) IVPB 1 g/50 mL premix        1 g 100 mL/hr over 30 Minutes Intravenous Every 8 hours 10/09/23 1418 10/10/23 0850        Patient was given sequential compression devices and early ambulation to prevent DVT.   Patient benefited maximally from hospital stay and there were no complications. At the time of discharge, the patient was urinating/moving their bowels without difficulty,  tolerating a regular diet, pain is controlled with oral pain medications and they have been cleared by PT/OT.   Recent vital signs: Patient Vitals for the past 24 hrs:  BP Temp Temp src Pulse Resp SpO2  10/11/23 0640 -- 98.9 F (37.2 C) Axillary -- -- --  10/11/23 0442 (!) 147/82 (!) 101.3 F (38.5 C) Oral (!) 109 17 100 %  10/10/23 2340 138/87 99.8 F (37.7 C) Oral -- 20 100 %  10/10/23 2014 130/69 99.4 F (37.4 C) Oral (!) 110 -- 99 %  10/10/23 1628 137/72 99.7 F (37.6 C) Oral (!) 110 20 99 %     Recent laboratory studies: No results for input(s): "WBC", "HGB", "HCT", "PLT", "NA", "K", "CL", "CO2", "BUN", "CREATININE", "GLUCOSE", "INR", "CALCIUM" in the last 72 hours.  Invalid input(s): "PT", "2"   Discharge Medications:   Allergies as of 10/11/2023   No Known Allergies      Medication List     STOP taking these medications    Cinnamon 500 MG Tabs   magnesium oxide 400 (240 Mg) MG tablet Commonly known as: MAG-OX   meloxicam 15 MG tablet Commonly known as: MOBIC   oxyCODONE-acetaminophen 5-325 MG tablet Commonly known as: PERCOCET/ROXICET Replaced by: oxyCODONE-acetaminophen 10-325 MG tablet   Trulicity 3 MG/0.5ML Soaj Generic drug: Dulaglutide       TAKE these medications    amLODipine 10 MG tablet Commonly known as: NORVASC Take 1 tablet (10 mg total) by mouth daily.   blood glucose meter kit and supplies Kit Dispense based on patient and  insurance preference. Test blood sugar once daily. Diabetes type 2. E11.9   enalapril 20 MG tablet Commonly known as: VASOTEC Take 20 mg by mouth daily.   freestyle lancets Use as instructed   gabapentin 300 MG capsule Commonly known as: NEURONTIN Take 900 mg by mouth 3 (three) times daily.   levothyroxine 75 MCG tablet Commonly known as: SYNTHROID TAKE 1 TABLET BY MOUTH EVERY DAY   metFORMIN 500 MG 24 hr tablet Commonly known as: GLUCOPHAGE-XR TAKE 2 TABLETS BY MOUTH EVERY DAY WITH BREAKFAST    methocarbamol 500 MG tablet Commonly known as: ROBAXIN Take 1 tablet (500 mg total) by mouth every 8 (eight) hours as needed for up to 5 days for muscle spasms.   ondansetron 4 MG tablet Commonly known as: Zofran Take 1 tablet (4 mg total) by mouth every 8 (eight) hours as needed for nausea or vomiting.   oxyCODONE-acetaminophen 10-325 MG tablet Commonly known as: Percocet Take 1 tablet by mouth every 6 (six) hours as needed for up to 5 days for pain. Replaces: oxyCODONE-acetaminophen 5-325 MG tablet               Durable Medical Equipment  (From admission, onward)           Start     Ordered   10/11/23 0734  For home use only DME 3 n 1  Once        10/11/23 0733   10/11/23 0733  For home use only DME Walker wide  Once       Question:  Patient needs a walker to treat with the following condition  Answer:  S/P lumbar spinal fusion   10/11/23 6063            Diagnostic Studies: DG Lumbar Spine 2-3 Views Result Date: 10/09/2023 CLINICAL DATA:  Elective surgery. EXAM: LUMBAR SPINE - 2-3 VIEW COMPARISON:  Preoperative imaging FINDINGS: Two fluoroscopic spot views of the lumbar spine from the operating room submitted. Posterior rod with pedicle screw fixation L4-L5 with interbody spacer. Fluoroscopy time 3 minutes 30 seconds. Dose 281.54 mGy. IMPRESSION: Intraoperative fluoroscopy during L4-L5 fusion. Electronically Signed   By: Narda Rutherford M.D.   On: 10/09/2023 14:55   DG C-Arm 1-60 Min-No Report Result Date: 10/09/2023 Fluoroscopy was utilized by the requesting physician.  No radiographic interpretation.   DG C-Arm 1-60 Min-No Report Result Date: 10/09/2023 Fluoroscopy was utilized by the requesting physician.  No radiographic interpretation.   DG C-Arm 1-60 Min-No Report Result Date: 10/09/2023 Fluoroscopy was utilized by the requesting physician.  No radiographic interpretation.   DG C-Arm 1-60 Min-No Report Result Date: 10/09/2023 Fluoroscopy was utilized  by the requesting physician.  No radiographic interpretation.   DG C-Arm 1-60 Min-No Report Result Date: 10/09/2023 Fluoroscopy was utilized by the requesting physician.  No radiographic interpretation.       Follow-up Information     Venita Lick, MD. Schedule an appointment as soon as possible for a visit in 2 week(s).   Specialty: Orthopedic Surgery Why: If symptoms worsen, For suture removal, For wound re-check Contact information: [address] [place] [address] [phone]                 Discharge Plan:  discharge to home today   Disposition: Brianna [name] is a very pleasant 33 year old female who is POD 1 from TLIF L4-L5. Overall, the surgery was without complications and was successful. Her hospital course thus far has been uncomplicated. She states that  her pre-operative right leg pain has improved, she states that she does still have her right pre-operative foot drop. However, the patient does state that she believes that her foot drop is improving. Patient is tolerating oral intake well. Ambulating independently. Positive void. Positive Flatus. Positive BM. The patient was cleared by PT. AFO brace was ordered and given to the patient. Patient will also receive rolling walker at d/c as well as a BSC 3 in 1. Patient educated about dressing care and understands that she cannot shower until POD 5. Patient will follow up with [place] in the clinic in 2 weeks for her 1st post-op appointment. All questions were welcomed and answered.     Signed: Roslyn Smiling for Dr. Venita Lick [facility] [phone] 10/11/2023, 7:33 AM

## 2023-10-12 ENCOUNTER — Telehealth: Payer: Self-pay | Admitting: *Deleted

## 2023-10-12 NOTE — Transitions of Care (Post Inpatient/ED Visit) (Signed)
 10/12/2023  Name: Brianna [name] MRN: [id number] DOB: 09-03-1990  Today's TOC FU Call Status: Today's TOC FU Call Status:: Successful TOC FU Call Completed TOC FU Call Complete Date: 10/12/23 Patient's Name and Date of Birth confirmed.  Transition Care Management Follow-up Telephone Call Date of Discharge: 10/11/23 Discharge Facility: [facility]) Type of Discharge: Inpatient Admission Primary Inpatient Discharge Diagnosis:: S/P lumbar fusion How have you been since you were released from the hospital?:  (eating & drinking well, last BM on 3/19, ambulating with walker, rates pain 4) Any questions or concerns?: No  Items Reviewed: Did you receive and understand the discharge instructions provided?: Yes Medications obtained,verified, and reconciled?: Yes (Medications Reviewed) Any new allergies since your discharge?: No Dietary orders reviewed?: Yes Type of Diet Ordered:: heart healthy ,  carbohydrate modified Do you have support at home?: Yes People in Home: parent(s) Name of Support/Comfort Primary Source: pt states " my mother and boyfriend help me" Patient declined enrollment in TOC 30 day program Pain assessment completed  Interventions Today    Flowsheet Row Most Recent Value  Chronic Disease   Chronic disease during today's visit Diabetes, Other  [s/p lumbar fusion]  General Interventions   General Interventions Discussed/Reviewed General Interventions Discussed, General Interventions Reviewed, Durable Medical Equipment (DME)  [pt states AIC checked by ortho before surgery in March 7.4,  CBG fasting ranges 113-116 per pt]  Durable Medical Equipment (DME) Bed side commode, Walker  Exercise Interventions   Exercise Discussed/Reviewed Assistive device use and maintanence  Nutrition Interventions   Nutrition Discussed/Reviewed Nutrition Discussed  Safety Interventions   Safety Discussed/Reviewed Safety Reviewed, Safety Discussed        Medications Reviewed  Today: Medications Reviewed Today     Reviewed by Audrie Gallus, RN (Registered Nurse) on 10/12/23 at 1147  Med List Status: <None>   Medication Order Taking? Sig Documenting Provider Last Dose Status Informant  amLODipine (NORVASC) 10 MG tablet [id number] Yes Take 1 tablet (10 mg total) by mouth daily. Campbell Riches, NP Taking Active Self  blood glucose meter kit and supplies KIT [id number] Yes Dispense based on patient and insurance preference. Test blood sugar once daily. Diabetes type 2. E11.9 Merlyn Albert, MD Taking Active Self  enalapril (VASOTEC) 20 MG tablet [id number] Yes Take 20 mg by mouth daily. [provider] Taking Active Self  gabapentin (NEURONTIN) 300 MG capsule [id number] No Take 900 mg by mouth 3 (three) times daily.  Patient not taking: Reported on 10/12/2023   [provider] Not Taking Active Self  Lancets (FREESTYLE) lancets [id number] Yes Use as instructed Merlyn Albert, MD Taking Active Self  levothyroxine (SYNTHROID) 75 MCG tablet [id number] Yes TAKE 1 TABLET BY MOUTH EVERY DAY Tommie Sams, DO Taking Active Self  metFORMIN (GLUCOPHAGE-XR) 500 MG 24 hr tablet [id number] Yes TAKE 2 TABLETS BY MOUTH EVERY DAY WITH BREAKFAST Campbell Riches, NP Taking Active Self  methocarbamol (ROBAXIN) 500 MG tablet [id number] Yes Take 1 tablet (500 mg total) by mouth every 8 (eight) hours as needed for up to 5 days for muscle spasms. Venita Lick, MD Taking Active   ondansetron [facility]) 4 MG tablet [id number] Yes Take 1 tablet (4 mg total) by mouth every 8 (eight) hours as needed for nausea or vomiting. Venita Lick, MD Taking Active   oxyCODONE-acetaminophen (PERCOCET) 10-325 MG tablet [id number] No Take 1 tablet by mouth every 6 (six) hours as needed for up to 5 days for pain.  Patient not taking:  Reported on 10/12/2023   Venita Lick, MD Not Taking Active            Med Note ([place] A   Thu Oct 12, 2023 11:47 AM) Switching to new pain med due  to side effects            Home Care and Equipment/Supplies: Were Home Health Services Ordered?: No Any new equipment or medical supplies ordered?: Yes (pt received walker and 3 in 1) Name of Medical supply agency?: pt states no sticker with name on DME Were you able to get the equipment/medical supplies?:  (delivered at hospital) Do you have any questions related to the use of the equipment/supplies?: No  Functional Questionnaire: Do you need assistance with bathing/showering or dressing?:  (family assists as needed) Do you need assistance with meal preparation?:  (family assists) Do you need assistance with eating?: No Do you have difficulty maintaining continence: No Do you need assistance with getting out of bed/getting out of a chair/moving?:  (walker) Do you have difficulty managing or taking your medications?: No  Follow up appointments reviewed: PCP Follow-up appointment confirmed?:  (pt states when she feels more able to go into office, she will call and scheduled appointment) MD Provider Line Number:[phone] Given: No Specialist Hospital Follow-up appointment confirmed?: Yes Date of Specialist follow-up appointment?: 10/24/23 Follow-Up Specialty Provider:: orthopedics Do you need transportation to your follow-up appointment?: No (family provides transportation) Do you understand care options if your condition(s) worsen?: Yes-patient verbalized understanding  SDOH Interventions Today    Flowsheet Row Most Recent Value  SDOH Interventions   Food Insecurity Interventions Intervention Not Indicated  Housing Interventions Intervention Not Indicated  Transportation Interventions Intervention Not Indicated  Utilities Interventions Intervention Not Indicated       Irving Shows [facility], BSN RN Care Manager/ Transition of Care [place]/ [facility] Population [facility] [phone]

## 2023-10-20 NOTE — Telephone Encounter (Signed)
 Patient has appt scheduled with clinical pharmacist 11/01/23

## 2023-10-27 ENCOUNTER — Other Ambulatory Visit: Payer: Self-pay | Admitting: Nurse Practitioner

## 2023-10-27 DIAGNOSIS — I10 Essential (primary) hypertension: Secondary | ICD-10-CM

## 2023-10-28 ENCOUNTER — Other Ambulatory Visit: Payer: Self-pay | Admitting: Family Medicine

## 2023-10-30 ENCOUNTER — Other Ambulatory Visit: Payer: Self-pay

## 2023-10-30 MED ORDER — LEVOTHYROXINE SODIUM 75 MCG PO TABS: 75.0000 ug | ORAL_TABLET | Freq: Every day | ORAL | 0 refills | Status: DC

## 2023-10-31 DIAGNOSIS — G8918 Other acute postprocedural pain: Secondary | ICD-10-CM | POA: Insufficient documentation

## 2023-11-01 ENCOUNTER — Other Ambulatory Visit: Payer: Self-pay | Admitting: Pharmacist

## 2023-11-01 NOTE — Progress Notes (Unsigned)
   11/01/2023 Name: Brianna [name] MRN: [id number] DOB: 1990-08-06  Chief Complaint  Patient presents with   Diabetes    Brianna [name] is a 33 y.o. year old female who presented for a telephone visit.   They were referred to the pharmacist by a quality report for assistance in managing diabetes.    Subjective:  Care Team: Primary Care Provider: Tommie Sams, DO ;  {[email or web address]:[phone]}  Medication Access/Adherence  Current Pharmacy:  [id number] - [place], [place] - [address] AT [facility] [address]  [place] [address] Phone: [phone] Fax: [phone]   Patient reports affordability concerns with their medications: No  Patient reports access/transportation concerns to their pharmacy: No  Patient reports adherence concerns with their medications:  No     Diabetes:  Current medications: Trulicity 3mg , metformin --call Medications tried in the past: glipizide, Mounjaro  Current glucose readings: running in the 100s Using traditional glucometer; testing daily Has worn Dexcom G7 samples pre-op, but not covered on insurance as patient is not on insulin  Patient denies hypoglycemic s/sx including dizziness, shakiness, sweating. Patient denies hyperglycemic symptoms including polyuria, polydipsia, polyphagia, nocturia, neuropathy, blurred vision.  Current meal patterns:  The patient is asked to make an attempt to improve diet and exercise patterns to aid in medical management of this problem. Discussed meal planning options and Plate method for healthy eating Avoid sugary drinks and desserts Incorporate balanced protein, non starchy veggies, 1 serving of carbohydrate with each meal Increase water intake Increase physical activity as able  Current physical activity: encouraged as able  Current medication access support: medicaid   Objective:  Lab Results  Component Value Date   HGBA1C 8.7 (H) 04/27/2023    Lab Results   Component Value Date   CREATININE 0.56 10/03/2023   BUN 11 10/03/2023   NA 140 10/03/2023   K 3.7 10/03/2023   CL 110 10/03/2023   CO2 25 10/03/2023    Lab Results  Component Value Date   CHOL 184 04/27/2023   HDL 58 04/27/2023   LDLCALC 117 (H) 04/27/2023   TRIG 43 04/27/2023   CHOLHDL 3.2 04/27/2023    Medications Reviewed Today   Medications were not reviewed in this encounter     Assessment/Plan:   Diabetes: - Currently uncontrolled - Reviewed long term cardiovascular and renal outcomes of uncontrolled blood sugar - Reviewed goal A1c, goal fasting, and goal 2 hour post prandial glucose - Reviewed dietary modifications including FOLLOWING A HEART HEALTHY DIET/HEALTHY PLATE METHOD - Reviewed lifestyle modifications including: increased physical activity - Recommend to restart Trulicity  - Recommend to check glucose daily (fasting) or if symptomatic    Follow Up Plan: encouraged patient to reach out as needed  Kieth Brightly, PharmD, BCACP, CPP Clinical Pharmacist, [facility]

## 2023-11-03 ENCOUNTER — Encounter: Payer: Self-pay | Admitting: Pharmacist

## 2023-11-17 DIAGNOSIS — M5440 Lumbago with sciatica, unspecified side: Secondary | ICD-10-CM | POA: Insufficient documentation

## 2023-11-18 ENCOUNTER — Other Ambulatory Visit: Payer: Self-pay | Admitting: Nurse Practitioner

## 2023-11-20 DIAGNOSIS — M5440 Lumbago with sciatica, unspecified side: Secondary | ICD-10-CM | POA: Diagnosis not present

## 2023-11-22 ENCOUNTER — Encounter (INDEPENDENT_AMBULATORY_CARE_PROVIDER_SITE_OTHER): Payer: Self-pay

## 2023-11-22 DIAGNOSIS — Z4889 Encounter for other specified surgical aftercare: Secondary | ICD-10-CM | POA: Diagnosis not present

## 2023-11-27 DIAGNOSIS — M5440 Lumbago with sciatica, unspecified side: Secondary | ICD-10-CM | POA: Diagnosis not present

## 2023-11-30 DIAGNOSIS — M5440 Lumbago with sciatica, unspecified side: Secondary | ICD-10-CM | POA: Diagnosis not present

## 2023-12-04 DIAGNOSIS — M5440 Lumbago with sciatica, unspecified side: Secondary | ICD-10-CM | POA: Diagnosis not present

## 2023-12-07 DIAGNOSIS — M5440 Lumbago with sciatica, unspecified side: Secondary | ICD-10-CM | POA: Diagnosis not present

## 2023-12-11 DIAGNOSIS — M5440 Lumbago with sciatica, unspecified side: Secondary | ICD-10-CM | POA: Diagnosis not present

## 2023-12-14 DIAGNOSIS — M5440 Lumbago with sciatica, unspecified side: Secondary | ICD-10-CM | POA: Diagnosis not present

## 2023-12-21 DIAGNOSIS — M5440 Lumbago with sciatica, unspecified side: Secondary | ICD-10-CM | POA: Diagnosis not present

## 2024-01-01 DIAGNOSIS — M5440 Lumbago with sciatica, unspecified side: Secondary | ICD-10-CM | POA: Diagnosis not present

## 2024-01-03 ENCOUNTER — Ambulatory Visit: Admitting: Family Medicine

## 2024-01-03 ENCOUNTER — Encounter: Payer: Self-pay | Admitting: Family Medicine

## 2024-01-03 VITALS — BP 182/110 | HR 105 | Temp 97.3°F | Ht 64.0 in | Wt 307.0 lb

## 2024-01-03 DIAGNOSIS — R1900 Intra-abdominal and pelvic swelling, mass and lump, unspecified site: Secondary | ICD-10-CM | POA: Diagnosis not present

## 2024-01-03 DIAGNOSIS — Z4889 Encounter for other specified surgical aftercare: Secondary | ICD-10-CM | POA: Diagnosis not present

## 2024-01-03 NOTE — Progress Notes (Signed)
 Subjective:  Patient ID: Brianna [name], female    DOB: [birth year]  Age: 33 y.o. MRN: [id number]  CC: Abdominal bulge  HPI:  33 year old female presents for evaluation of the above.  Patient states that she has noticed a bulge around her bellybutton for the past 2 years.  She states that last week she has pain in the area which was sharp.  She also felt like it was getting larger.  Therefore, she thought it be best that she get examined.  No abdominal pain currently.  No other symptoms.  No other complaints.  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   Obstructive sleep apnea 04/02/2021   Hypothyroidism 12/09/2019   Hyperlipidemia associated with type 2 diabetes mellitus (HCC) 04/19/2017   HNP (herniated nucleus pulposus), lumbar 09/03/2014   Morbid obesity (HCC) 09/03/2014   Type 2 diabetes mellitus (HCC) 09/03/2014   HTN (hypertension) 09/03/2014    Social Hx   Social History   Socioeconomic History   Marital status: Single    Spouse name: Not on file   Number of children: Not on file   Years of education: Not on file   Highest education level: Not on file  Occupational History   Not on file  Tobacco Use   Smoking status: Never   Smokeless tobacco: Never  Vaping Use   Vaping status: Never Used  Substance and Sexual Activity   Alcohol use: Not Currently   Drug use: No   Sexual activity: Yes    Birth control/protection: Implant  Other Topics Concern   Not on file  Social History Narrative   Not on file   Social Drivers of Health   Financial Resource Strain: Low Risk  (08/15/2023)   Overall Financial Resource Strain (CARDIA)    Difficulty of Paying Living Expenses: Not hard at all  Food Insecurity: No Food Insecurity (10/12/2023)   Hunger Vital Sign    Worried About Running Out of Food in the Last Year: Never true    Ran Out of Food in the  Last Year: Never true  Transportation Needs: No Transportation Needs (10/12/2023)   PRAPARE - Administrator, Civil Service (Medical): No    Lack of Transportation (Non-Medical): No  Physical Activity: Insufficiently Active (12/16/2021)   Exercise Vital Sign    Days of Exercise per Week: 1 day    Minutes of Exercise per Session: 10 min  Stress: No Stress Concern Present (12/16/2021)   [employer] of Occupational Health - Occupational Stress Questionnaire    Feeling of Stress : Only a little  Social Connections: Moderately Isolated (12/16/2021)   Social Connection and Isolation Panel [NHANES]    Frequency of Communication with Friends and Family: More than three times a week    Frequency of Social Gatherings with Friends and Family: Twice a week    Attends Religious Services: 1 to 4 times per year    Active Member of [employer] or Organizations: No    Attends Banker Meetings: Never    Marital Status: Never married    Review of Systems Per HPI  Objective:  BP (!) 182/110   Pulse (!) 105   Temp (!) 97.3 F (36.3 C)   Ht 5' 4 (1.626 m)   Wt (!) 307 lb (139.3 kg)   SpO2 98%   BMI 52.70 kg/m  01/03/2024    1:19 PM 01/03/2024    1:00 PM 10/11/2023    8:15 AM  BP/Weight  Systolic BP 182 194 111  Diastolic BP 110 90 64  Wt. (Lbs)  307   BMI  52.7 kg/m2     Physical Exam Constitutional:      Appearance: Normal appearance. She is obese.  Abdominal:     General: There is no distension.     Palpations: Abdomen is soft.     Comments: Questionable abdominal bulge in the periumbilical region.  Difficult to fully assess given body habitus.  Neurological:     Mental Status: She is alert.     Lab Results  Component Value Date   WBC 4.8 08/08/2023   HGB 11.0 (L) 08/08/2023   HCT 34.0 (L) 08/08/2023   PLT 243 08/08/2023   GLUCOSE 124 (H) 10/03/2023   CHOL 184 04/27/2023   TRIG 43 04/27/2023   HDL 58 04/27/2023   LDLCALC 117 (H) 04/27/2023    ALT 21 08/29/2023   AST 17 08/29/2023   NA 140 10/03/2023   K 3.7 10/03/2023   CL 110 10/03/2023   CREATININE 0.56 10/03/2023   BUN 11 10/03/2023   CO2 25 10/03/2023   TSH 1.740 08/29/2023   HGBA1C 8.7 (H) 04/27/2023     Assessment & Plan:  Abdominal wall bulge Assessment & Plan: No worrisome findings at this time.  Advised watchful waiting.  If worsens, will proceed with CT imaging and referral to general surgery.     Follow-up:  Return if symptoms worsen or fail to improve.  Kathleen Papa DO [facility]

## 2024-01-03 NOTE — Assessment & Plan Note (Signed)
 No worrisome findings at this time.  Advised watchful waiting.  If worsens, will proceed with CT imaging and referral to general surgery.

## 2024-01-03 NOTE — Patient Instructions (Signed)
 Keep an eye on the area and let us  know if this or worsens.  Take care.  Dr. Debrah Fan

## 2024-01-10 ENCOUNTER — Encounter (INDEPENDENT_AMBULATORY_CARE_PROVIDER_SITE_OTHER): Payer: Self-pay

## 2024-01-11 ENCOUNTER — Encounter: Payer: Self-pay | Admitting: Nurse Practitioner

## 2024-01-11 ENCOUNTER — Ambulatory Visit (INDEPENDENT_AMBULATORY_CARE_PROVIDER_SITE_OTHER): Admitting: Internal Medicine

## 2024-01-11 ENCOUNTER — Encounter (INDEPENDENT_AMBULATORY_CARE_PROVIDER_SITE_OTHER): Payer: Self-pay | Admitting: Internal Medicine

## 2024-01-11 VITALS — BP 179/120 | HR 105 | Temp 98.8°F | Ht 64.0 in | Wt 304.0 lb

## 2024-01-11 DIAGNOSIS — Z7985 Long-term (current) use of injectable non-insulin antidiabetic drugs: Secondary | ICD-10-CM

## 2024-01-11 DIAGNOSIS — Z0289 Encounter for other administrative examinations: Secondary | ICD-10-CM

## 2024-01-11 DIAGNOSIS — E66813 Obesity, class 3: Secondary | ICD-10-CM

## 2024-01-11 DIAGNOSIS — I10 Essential (primary) hypertension: Secondary | ICD-10-CM

## 2024-01-11 DIAGNOSIS — E1169 Type 2 diabetes mellitus with other specified complication: Secondary | ICD-10-CM

## 2024-01-11 DIAGNOSIS — Z6841 Body Mass Index (BMI) 40.0 and over, adult: Secondary | ICD-10-CM

## 2024-01-11 DIAGNOSIS — E785 Hyperlipidemia, unspecified: Secondary | ICD-10-CM | POA: Diagnosis not present

## 2024-01-11 DIAGNOSIS — G4733 Obstructive sleep apnea (adult) (pediatric): Secondary | ICD-10-CM

## 2024-01-11 NOTE — Progress Notes (Signed)
 Office: [phone]  /  Fax: [phone]   Initial Visit    Brianna [name] was seen in clinic today to evaluate for obesity. Brianna [name] is interested in losing weight to improve overall health and reduce the risk of weight related complications. Brianna [name] presents today to review program treatment options, initial physical assessment, and evaluation.     Discussed the use of AI scribe software for clinical note transcription with the patient, who gave verbal consent to proceed.  History of Present Illness   Brianna [name] is a 33 year old female who presents for initial consultation for medical obesity management.  Brianna [name] has a history of obesity with a highest recorded weight of 322 pounds. Brianna [name] managed to reduce her weight to 269 pounds before her pregnancy but has since regained weight. Brianna [name] has not been following any specific nutrition plan recently, although Brianna [name] previously tried the keto diet and was on Mounjaro , which helped her lose weight. Brianna [name] is currently on Trulicity  but feels it is not as effective for weight loss.  Brianna [name] has a long-standing history of hypertension, reportedly since high school. Brianna [name] occasionally forgets to take her medications, which include amlodipine  10 mg and enalapril . Brianna [name] does not regularly monitor her blood pressure at home despite having a wrist monitor.  Brianna [name] was diagnosed with type 2 diabetes in 2011. Her last recorded HbA1c was 8%, although Brianna [name] managed to lower it from 9% to 7% previously. Brianna [name] is on metformin  and Trulicity  for diabetes management. No complications such as neuropathy, retinopathy, or nephropathy, although Brianna [name] experiences drops in her feet, which Brianna [name] is unsure if related to diabetes or her back issues.  Brianna [name] has sleep apnea diagnosed via a home sleep study in 2022, which showed significant apnea with 29 minutes of oxygen saturation below 88%. Brianna [name] tried CPAP but found it ineffective and uncomfortable, as reported by her mother. Brianna [name] has not pursued further  treatment options.  Brianna [name] has a history of hyperlipidemia, although specific details about her lipid levels were not discussed. Brianna [name] is not currently on any specific treatment for hyperlipidemia.  Brianna [name] experiences fatigue and tiredness, which Brianna [name] attributes to her sleep apnea and possibly her diabetes.        Brianna [name] was referred by: Specialist - surgeon - after back surgery  When asked what else they would like to accomplish? Brianna [name] states: Adopt healthier eating patterns, Improve energy levels and physical activity, Improve existing medical conditions, Improve quality of life, and Improve self-confidence  When asked how has your weight affected you? Brianna [name] states: Has affected self-esteem, Contributed to medical problems, Contributed to orthopedic problems or mobility issues, Having fatigue, Having poor endurance, Problems with eating patterns, and Has affected mood   Weight history: Lifelong  Highest weight: 322  Some associated conditions: Hypertension, Arthritis:back, Hyperlipidemia, OSA, and Diabetes  Contributing factors: family history of obesity, disruption of circadian rhythm / sleep disordered breathing, consumption of processed foods, moderate to high levels of stress, reduced physical acitivity, eating patterns, and strong orexigenic signaling and/or inadequate inhibitory control   Weight promoting medications identified: Other: had been on gabapentin  previously  Prior weight loss attempts: None  Current nutrition plan: None  Current level of physical activity: Low levels of physical activity at present  Current or previous pharmacotherapy: GLP-1 - monjauro  Response to medication: Lost weight initially but was unable to sustain weight loss   Past medical history includes:   Past Medical History:  Diagnosis Date   Anemia    Iron Deficiency  Anemia   Diabetes mellitus without complication (HCC)    Hypertension    Hypothyroidism    Obesity    Pain    back pain, dx. herniated  nucleus polpusos   Sleep apnea    Unable to tolerate CPAP   Thyroid  disease      Objective    BP (!) 179/120   Pulse (!) 105   Temp 98.8 F (37.1 C)   Ht 5' 4 (1.626 m)   Wt (!) 304 lb (137.9 kg)   SpO2 98%   BMI 52.18 kg/m  Brianna [name] was weighed on the bioimpedance scale: Body mass index is 52.18 kg/m.  Body Fat%:56, Visceral Fat Rating:19, Weight trend over the last 12 months: Decreasing  General:  Alert, oriented and cooperative. Patient is in no acute distress.  Respiratory: Normal respiratory effort, no problems with respiration noted   Gait: able to ambulate independently  Mental Status: Normal mood and affect. Normal behavior. Normal judgment and thought content.   DIAGNOSTIC DATA REVIEWED:  BMET    Component Value Date/Time   NA 140 10/03/2023 0847   NA 136 04/27/2023 0938   K 3.7 10/03/2023 0847   CL 110 10/03/2023 0847   CO2 25 10/03/2023 0847   GLUCOSE 124 (H) 10/03/2023 0847   BUN 11 10/03/2023 0847   BUN 13 04/27/2023 0938   CREATININE 0.56 10/03/2023 0847   CALCIUM 8.9 10/03/2023 0847   GFRNONAA >60 10/03/2023 0847   GFRAA 153 05/22/2020 1026   Lab Results  Component Value Date   HGBA1C 8.7 (H) 04/27/2023   HGBA1C 11.3 (H) 09/03/2014   No results found for: INSULIN  CBC    Component Value Date/Time   WBC 4.8 08/08/2023 0306   RBC 3.93 08/08/2023 0306   HGB 11.0 (L) 08/08/2023 0306   HGB 11.4 04/27/2023 0938   HCT 34.0 (L) 08/08/2023 0306   HCT 36.9 04/27/2023 0938   PLT 243 08/08/2023 0306   PLT 252 04/27/2023 0938   MCV 86.5 08/08/2023 0306   MCV 87 04/27/2023 0938   MCH 28.0 08/08/2023 0306   MCHC 32.4 08/08/2023 0306   RDW 13.1 08/08/2023 0306   RDW 13.5 04/27/2023 0938   Iron/TIBC/Ferritin/ %Sat No results found for: IRON, TIBC, FERRITIN, IRONPCTSAT Lipid Panel     Component Value Date/Time   CHOL 184 04/27/2023 0938   TRIG 43 04/27/2023 0938   HDL 58 04/27/2023 0938   CHOLHDL 3.2 04/27/2023 0938   CHOLHDL 3.4  09/05/2014 0550   VLDL 15 09/05/2014 0550   LDLCALC 117 (H) 04/27/2023 0938   Hepatic Function Panel     Component Value Date/Time   PROT 7.7 08/29/2023 0935   ALBUMIN  4.5 08/29/2023 0935   AST 17 08/29/2023 0935   ALT 21 08/29/2023 0935   ALKPHOS 77 08/29/2023 0935   BILITOT 0.3 08/29/2023 0935   BILIDIR 0.12 08/29/2023 0935      Component Value Date/Time   TSH 1.740 08/29/2023 0935     Assessment and Plan   Primary hypertension  Obstructive sleep apnea  Type 2 diabetes mellitus with other specified complication, without long-term current use of insulin  (HCC)  Class 3 severe obesity with serious comorbidity and body mass index (BMI) of 50.0 to 59.9 in adult, unspecified obesity type    Assessment and Plan    Obesity Obesity is a chronic condition with a BMI of 52, associated with comorbidities including hypertension, type 2 diabetes, sleep apnea, and hyperlipidemia. Brianna [name] has struggled with weight since childhood  and has a family history of obesity. Brianna [name] is interested in medically supervised weight management to improve overall health, including blood pressure, diabetes, and sleep apnea. The complexity of obesity, including genetic and environmental factors, and the importance of a comprehensive approach to management were discussed. Brianna [name] expressed interest in learning to eat healthier and improving quality of life. The program involves intensive lifestyle changes with visits every 2-3 weeks initially, then monthly. Successful long-term weight management requires ongoing commitment, with potential for 10-15% weight loss improving comorbidities and over 20% potentially putting diabetes into remission. Gastric bypass surgery is a potential option if medical management is insufficient, especially given her diabetes and BMI. - Enroll in medically supervised weight management program. - Schedule initial appointment for metabolic testing and nutrition plan. - Complete 12-page  questionnaire before next visit. - Fast for 8 hours before next visit for blood work and metabolic testing. - Consider switching from Trulicity  to Mounjaro  for better weight management. - Discuss potential candidacy for gastric bypass surgery if medical management is insufficient.  We reviewed anthropometrics, biometrics, associated medical conditions and contributing factors with patient. Brianna [name] would benefit from a medically tailored reduced calorie nutrional plan based on her REE (resting energy expenditure), which will be determined by indirect calorimetry.  We will also assess for cardiometabolic risk and nutritional derangements via fasting labs at intake appointment.    Type 2 Diabetes Mellitus Type 2 diabetes is poorly controlled with a recent A1c of 8. Brianna [name] has been on metformin  and Trulicity , with a history of using Mounjaro , which was more effective for weight loss. The relationship between weight management and diabetes control was emphasized, with potential for remission with significant weight loss. Switching back to Mounjaro  or considering Frederik Jansky was discussed as a better option for weight and diabetes management. - Consider switching from Trulicity  to Mounjaro  for better diabetes and weight management. - Monitor blood glucose levels regularly. - Review diabetes preventive measures at subsequent appointments to make sure Brianna [name] is up-to-date  Hypertension Hypertension is uncontrolled with a current reading of 174/108 mmHg. Brianna [name] has a long-standing history of hypertension since high school and is currently on amlodipine  and enalapril . Non-adherence with medication regimen may contribute to poor control. Risks of uncontrolled hypertension include stroke, heart failure, and kidney failure, necessitating aggressive management. Brianna [name] was advised to obtain an upper arm blood pressure monitor for accurate home readings and to monitor blood pressure twice daily. - Take blood pressure medications as  prescribed. - Obtain an upper arm blood pressure monitor for home use. - Monitor blood pressure twice daily and report readings to PCP. - Contact PCP if blood pressure remains above 140/90 mmHg. - Take blood pressure medications today  Obstructive Sleep Apnea Significant obstructive sleep apnea with unsuccessful CPAP use. The relationship between sleep apnea and obesity, and the impact on blood pressure and overall health were explained. Alternative treatments, including weight loss and possibly surgical options, were considered. Untreated sleep apnea exacerbates weight gain and complicates hypertension management. - Consider repeat sleep study with a sleep specialist. - Explore alternative CPAP masks and settings. - Focus on weight loss to improve sleep apnea.  Hyperlipidemia Hyperlipidemia is present, contributing to cardiovascular risk. Weight management is crucial in controlling lipid levels. - Incorporate dietary changes as part of the weight management program.  -Uncertain if Brianna [name] is on a statin medications need to be reconciled.            Obesity Treatment / Action Plan:  Patient will work on garnering  support from family and friends to begin weight loss journey. Will work on eliminating or reducing the presence of highly palatable, calorie dense foods in the home. Will complete provided nutritional and psychosocial assessment questionnaire before the next appointment. Will be scheduled for indirect calorimetry to determine resting energy expenditure in a fasting state.  This will allow us  to create a reduced calorie, high-protein meal plan to promote loss of fat mass while preserving muscle mass. Counseled on the health benefits of losing 5%-15% of total body weight. Was counseled on nutritional approaches to weight loss and benefits of reducing processed foods and consuming plant-based foods and high quality protein as part of nutritional weight management. Was counseled on  pharmacotherapy and role as an adjunct in weight management.   Obesity Education Performed Today:  Brianna [name] was weighed on the bioimpedance scale and results were discussed and documented in the synopsis.  We discussed obesity as a disease and the importance of a more detailed evaluation of all the factors contributing to the disease.  We discussed the importance of long term lifestyle changes which include nutrition, exercise and behavioral modifications as well as the importance of customizing this to her specific health and social needs.  We discussed the benefits of reaching a healthier weight to alleviate the symptoms of existing conditions and reduce the risks of the biomechanical, metabolic and psychological effects of obesity.  We reviewed the four pillars of obesity medicine and importance of using a multimodal approach.  We reviewed the basic principles in weight management.   Brianna [name] appears to be in the action stage of change and states they are ready to start intensive lifestyle modifications and behavioral modifications.  I have spent 46 minutes in the care of the patient today including: 3 minutes before the visit reviewing and preparing the chart. 35 minutes face-to-face assessing and reviewing listed medical problems as outlined in obesity care plan, providing nutritional and behavioral counseling on topics outlined in the obesity care plan, counseling regarding anti-obesity medication as outlined in obesity care plan, independently interpreting test results and goals of care, as described in assessment and plan, reviewing and discussing biometric information and progress, and reviewing latest PCP notes and specialist consultations 8 minutes after the visit updating chart and documentation of encounter.  Reviewed by clinician on day of visit: allergies, medications, problem list, medical history, surgical history, family history, social history, and previous encounter  notes pertinent to obesity diagnosis.   Ladd Picker, MD

## 2024-01-25 ENCOUNTER — Other Ambulatory Visit: Payer: Self-pay | Admitting: Nurse Practitioner

## 2024-01-26 ENCOUNTER — Other Ambulatory Visit: Payer: Self-pay | Admitting: Family Medicine

## 2024-02-04 ENCOUNTER — Other Ambulatory Visit: Payer: Self-pay | Admitting: Family Medicine

## 2024-02-05 ENCOUNTER — Other Ambulatory Visit: Payer: Self-pay

## 2024-02-05 ENCOUNTER — Encounter: Payer: Self-pay | Admitting: Family Medicine

## 2024-02-05 ENCOUNTER — Other Ambulatory Visit: Payer: Self-pay | Admitting: Family Medicine

## 2024-02-06 ENCOUNTER — Other Ambulatory Visit: Payer: Self-pay | Admitting: Family Medicine

## 2024-02-06 MED ORDER — TRULICITY 3 MG/0.5ML ~~LOC~~ SOAJ: 3.0000 mg | SUBCUTANEOUS | 3 refills | Status: DC

## 2024-02-29 ENCOUNTER — Encounter: Payer: Self-pay | Admitting: Nurse Practitioner

## 2024-03-07 ENCOUNTER — Ambulatory Visit (INDEPENDENT_AMBULATORY_CARE_PROVIDER_SITE_OTHER): Admitting: Nurse Practitioner

## 2024-03-07 ENCOUNTER — Encounter: Payer: Self-pay | Admitting: Nurse Practitioner

## 2024-03-07 VITALS — BP 138/82 | HR 110 | Temp 98.1°F | Ht 64.0 in | Wt 313.2 lb

## 2024-03-07 DIAGNOSIS — B3731 Acute candidiasis of vulva and vagina: Secondary | ICD-10-CM

## 2024-03-07 DIAGNOSIS — I10 Essential (primary) hypertension: Secondary | ICD-10-CM

## 2024-03-07 DIAGNOSIS — E1169 Type 2 diabetes mellitus with other specified complication: Secondary | ICD-10-CM | POA: Diagnosis not present

## 2024-03-07 DIAGNOSIS — Z7985 Long-term (current) use of injectable non-insulin antidiabetic drugs: Secondary | ICD-10-CM

## 2024-03-07 DIAGNOSIS — D649 Anemia, unspecified: Secondary | ICD-10-CM

## 2024-03-07 DIAGNOSIS — E785 Hyperlipidemia, unspecified: Secondary | ICD-10-CM

## 2024-03-07 MED ORDER — FLUCONAZOLE 150 MG PO TABS: ORAL_TABLET | ORAL | 0 refills | Status: DC

## 2024-03-07 NOTE — Progress Notes (Addendum)
 Subjective:    Patient ID: Brianna [name], female    DOB: 02-27-1991, 33 y.o.   MRN: [id number]  HPI Discussed the use of AI scribe software for clinical note transcription with the patient, who gave verbal consent to proceed.  History of Present Illness Brianna [name] is a 33 year old female with type 2 diabetes and obstructive sleep apnea who presents for medication management and follow-up.  She is currently on Trulicity  3 mg for her type 2 diabetes and has previously tried Ozempic  and Mounjaro . She experiences occasional diarrhea and nausea, particularly after her Trulicity  injection, which also causes sulfuric belching. No issues with constipation, vomiting, or acid reflux. She has not had recent lab work since March, with her last A1c done at [facility].  She has obstructive sleep apnea and mentions the need for another sleep study. A home-based sleep test has not been scheduled yet, and she is unsure if it will be handled by her current care team.  She underwent back surgery in March and reports ongoing pain, particularly when lying down or standing for long periods. She experiences foot drop and uses an elliptical for exercise, although she has not used it since mid-July. She is able to walk and is considering increasing her physical activity to help with weight management.  She has gained weight since June, which she attributes to being at home post-surgery. Her diet has regressed to include more processed foods, although she does cook at times. She reports good sleep quality.  She is experiencing a vaginal yeast infection with itching and a small amount of white discharge. There is no significant odor, pelvic pain, or new sexual partners. She is unsure if she has taken Diflucan  before.  No chest pain, shortness of breath, coughing, or wheezing. She has a Nexplanon  implant for birth control and is concerned about its placement, but it feels fine.  Review of Systems  Constitutional:   Positive for fatigue.  HENT:  Negative for sore throat and trouble swallowing.   Respiratory:  Negative for cough, chest tightness and shortness of breath.   Cardiovascular:  Negative for chest pain.  Gastrointestinal:  Positive for diarrhea and nausea. Negative for abdominal pain, constipation and vomiting.  Genitourinary:  Positive for vaginal discharge.  Musculoskeletal:  Positive for back pain.      01/03/2024    1:21 PM  Depression screen PHQ 2/9  Decreased Interest 0  Down, Depressed, Hopeless 0  PHQ - 2 Score 0  Altered sleeping 0  Tired, decreased energy 0  Change in appetite 0  Feeling bad or failure about yourself  0  Trouble concentrating 0  Moving slowly or fidgety/restless 0  Suicidal thoughts 0  PHQ-9 Score 0  Difficult doing work/chores Not difficult at all      01/03/2024    1:21 PM 08/29/2023    8:27 AM 04/27/2023    8:39 AM 05/20/2022    2:41 PM  GAD 7 : Generalized Anxiety Score  Nervous, Anxious, on Edge 0 0 0 0  Control/stop worrying 0 0 0 0  Worry too much - different things 0 0 0 0  Trouble relaxing 0 0 0 0  Restless 0 0 0 0  Easily annoyed or irritable 0 0 1 0  Afraid - awful might happen 0 0 0 0  Total GAD 7 Score 0 0 1 0  Anxiety Difficulty Not difficult at all Not difficult at all  Not difficult at all    Social  History   Tobacco Use   Smoking status: Never   Smokeless tobacco: Never  Vaping Use   Vaping status: Never Used  Substance Use Topics   Alcohol use: Not Currently   Drug use: No        Objective:   Physical Exam Vitals reviewed.  Constitutional:      General: She is not in acute distress. Neck:     Comments: Thyroid  nontender to palpation, no mass or goiter noted. Cardiovascular:     Rate and Rhythm: Normal rate and regular rhythm.  Pulmonary:     Effort: Pulmonary effort is normal.     Breath sounds: Normal breath sounds.  Abdominal:     General: There is no distension.     Palpations: Abdomen is soft.      Tenderness: There is no abdominal tenderness.  Genitourinary:    Comments: Defers GU exam. Musculoskeletal:     Right lower leg: Edema present.     Left lower leg: Edema present.     Comments: Trace pitting edema lower extremities.  Neurological:     Mental Status: She is alert and oriented to person, place, and time.  Psychiatric:        Mood and Affect: Mood normal.        Behavior: Behavior normal.        Thought Content: Thought content normal.        Judgment: Judgment normal.    Today's Vitals   03/07/24 1048 03/07/24 1144  BP: (!) 130/90 138/82  Pulse: (!) 110   Temp: 98.1 F (36.7 C)   SpO2: 99%   Weight: (!) 313 lb 4 oz (142.1 kg)   Height: 5' 4 (1.626 m)    Body mass index is 53.77 kg/m.         Assessment & Plan:  Assessment and Plan Assessment & Plan Problem List Items Addressed This Visit       Cardiovascular and Mediastinum   HTN (hypertension)   Relevant Medications   labetalol  (NORMODYNE ) 200 MG tablet   NIFEdipine  (PROCARDIA  XL/NIFEDICAL XL) 60 MG 24 hr tablet   Other Relevant Orders   CBC with Differential/Platelet   Comprehensive metabolic panel with GFR     Endocrine   Hyperlipidemia associated with type 2 diabetes mellitus (HCC)   Relevant Medications   labetalol  (NORMODYNE ) 200 MG tablet   NIFEdipine  (PROCARDIA  XL/NIFEDICAL XL) 60 MG 24 hr tablet   Other Relevant Orders   CBC with Differential/Platelet   Type 2 diabetes mellitus (HCC) - Primary   Relevant Orders   CBC with Differential/Platelet   Comprehensive metabolic panel with GFR   Hemoglobin A1c   Other Visit Diagnoses       Anemia, unspecified type       Relevant Orders   Lipid panel     Vulvovaginal candidiasis       Relevant Medications   fluconazole  (DIFLUCAN ) 150 MG tablet      Meds ordered this encounter  Medications   fluconazole  (DIFLUCAN ) 150 MG tablet    Sig: One po qd prn yeast infection; may repeat in 3-4 days if needed    Dispense:  2 tablet     Refill:  0    Supervising Provider:   ALPHONSA HAMILTON A [[id number]]     Type 2 diabetes mellitus on GLP-1 therapy with monitoring for medication side effects Managed with Trulicity  3 mg. Previous use of Ozempic  and Mounjaro  noted. Monitoring for side effects. A1c needs checking for  diabetes control and medication adjustment. - Order A1c and basic blood work. - Continue Trulicity  at 3 mg. - Consider increasing Trulicity  dose if A1c indicates need and side effects are tolerable. - Explore prior authorization for Mounjaro  if Trulicity  at maximum dose is ineffective. - Monitor for side effects such as nausea and sulfuric belching.  Hypertension Blood pressure elevated, possibly due to not having taken medication yet.  Continue follow up with [facility] as planned. Consideration of bariatric surgery options: gastric sleeve, duodenal switch, and gastric bypass. Risks and benefits discussed. Emphasis on weight loss to alleviate back pain and improve health. - Discuss bariatric surgery options with healthy weight and wellness program. - Encourage weight loss through diet and exercise, including setting goals for elliptical use and walking.  - Encourage use of elliptical three times a week. - Set goal of 7,000 steps per day.  Anemia, unspecified Previous anemia noted, with improvement but still present. - Order basic blood work to monitor anemia.  Vulvovaginal candidiasis Symptoms include itching and minimal white discharge. Treatment with Diflucan  discussed. - Prescribe Diflucan , one pill followed by a second pill after three days. - Monitor symptoms and follow up if not resolved.    Return in about 3 months (around 06/07/2024).

## 2024-03-07 NOTE — Patient Instructions (Signed)
Duodenal switch

## 2024-03-08 ENCOUNTER — Encounter: Payer: Self-pay | Admitting: Nurse Practitioner

## 2024-03-08 DIAGNOSIS — E1169 Type 2 diabetes mellitus with other specified complication: Secondary | ICD-10-CM | POA: Diagnosis not present

## 2024-03-08 DIAGNOSIS — E785 Hyperlipidemia, unspecified: Secondary | ICD-10-CM | POA: Diagnosis not present

## 2024-03-08 DIAGNOSIS — I10 Essential (primary) hypertension: Secondary | ICD-10-CM | POA: Diagnosis not present

## 2024-03-08 DIAGNOSIS — D649 Anemia, unspecified: Secondary | ICD-10-CM | POA: Diagnosis not present

## 2024-03-09 ENCOUNTER — Other Ambulatory Visit: Payer: Self-pay | Admitting: Nurse Practitioner

## 2024-03-09 ENCOUNTER — Ambulatory Visit: Payer: Self-pay | Admitting: Nurse Practitioner

## 2024-03-09 LAB — CBC WITH DIFFERENTIAL/PLATELET
Basophils Absolute: 0 x10E3/uL (ref 0.0–0.2)
Basos: 1 %
EOS (ABSOLUTE): 0.2 x10E3/uL (ref 0.0–0.4)
Eos: 4 %
Hematocrit: 34.6 % (ref 34.0–46.6)
Hemoglobin: 10.7 g/dL — ABNORMAL LOW (ref 11.1–15.9)
Immature Grans (Abs): 0 x10E3/uL (ref 0.0–0.1)
Immature Granulocytes: 0 %
Lymphocytes Absolute: 1.3 x10E3/uL (ref 0.7–3.1)
Lymphs: 27 %
MCH: 27 pg (ref 26.6–33.0)
MCHC: 30.9 g/dL — ABNORMAL LOW (ref 31.5–35.7)
MCV: 87 fL (ref 79–97)
Monocytes Absolute: 0.5 x10E3/uL (ref 0.1–0.9)
Monocytes: 10 %
Neutrophils Absolute: 2.7 x10E3/uL (ref 1.4–7.0)
Neutrophils: 58 %
Platelets: 230 x10E3/uL (ref 150–450)
RBC: 3.97 x10E6/uL (ref 3.77–5.28)
RDW: 13.9 % (ref 11.7–15.4)
WBC: 4.7 x10E3/uL (ref 3.4–10.8)

## 2024-03-09 LAB — COMPREHENSIVE METABOLIC PANEL WITH GFR
ALT: 16 IU/L (ref 0–32)
AST: 9 IU/L (ref 0–40)
Albumin: 4.1 g/dL (ref 3.9–4.9)
Alkaline Phosphatase: 69 IU/L (ref 44–121)
BUN/Creatinine Ratio: 14 (ref 9–23)
BUN: 8 mg/dL (ref 6–20)
Bilirubin Total: 0.2 mg/dL (ref 0.0–1.2)
CO2: 20 mmol/L (ref 20–29)
Calcium: 8.7 mg/dL (ref 8.7–10.2)
Chloride: 102 mmol/L (ref 96–106)
Creatinine, Ser: 0.58 mg/dL (ref 0.57–1.00)
Globulin, Total: 3.3 g/dL (ref 1.5–4.5)
Glucose: 223 mg/dL — ABNORMAL HIGH (ref 70–99)
Potassium: 4.5 mmol/L (ref 3.5–5.2)
Sodium: 136 mmol/L (ref 134–144)
Total Protein: 7.4 g/dL (ref 6.0–8.5)
eGFR: 123 mL/min/1.73 (ref >59)

## 2024-03-09 LAB — LIPID PANEL
Chol/HDL Ratio: 3.6 ratio (ref 0.0–4.4)
Cholesterol, Total: 171 mg/dL (ref 100–199)
HDL: 47 mg/dL (ref >39)
LDL Chol Calc (NIH): 115 mg/dL — ABNORMAL HIGH (ref 0–99)
Triglycerides: 42 mg/dL (ref 0–149)
VLDL Cholesterol Cal: 9 mg/dL (ref 5–40)

## 2024-03-09 LAB — HEMOGLOBIN A1C
Est. average glucose Bld gHb Est-mCnc: 194 mg/dL
Hgb A1c MFr Bld: 8.4 % — ABNORMAL HIGH (ref 4.8–5.6)

## 2024-03-14 ENCOUNTER — Other Ambulatory Visit: Payer: Self-pay | Admitting: Nurse Practitioner

## 2024-03-14 MED ORDER — SEMAGLUTIDE(0.25 OR 0.5MG/DOS) 2 MG/3ML ~~LOC~~ SOPN: 0.5000 mg | PEN_INJECTOR | SUBCUTANEOUS | 0 refills | Status: DC

## 2024-03-19 ENCOUNTER — Encounter (INDEPENDENT_AMBULATORY_CARE_PROVIDER_SITE_OTHER): Payer: Self-pay

## 2024-03-19 DIAGNOSIS — Z4889 Encounter for other specified surgical aftercare: Secondary | ICD-10-CM | POA: Diagnosis not present

## 2024-04-04 ENCOUNTER — Encounter (INDEPENDENT_AMBULATORY_CARE_PROVIDER_SITE_OTHER): Payer: Self-pay | Admitting: Internal Medicine

## 2024-04-04 ENCOUNTER — Other Ambulatory Visit: Payer: Self-pay | Admitting: Nurse Practitioner

## 2024-04-04 ENCOUNTER — Ambulatory Visit (INDEPENDENT_AMBULATORY_CARE_PROVIDER_SITE_OTHER): Admitting: Internal Medicine

## 2024-04-04 ENCOUNTER — Encounter: Payer: Self-pay | Admitting: Nurse Practitioner

## 2024-04-04 VITALS — BP 142/86 | Temp 98.7°F | Ht 64.0 in | Wt 307.0 lb

## 2024-04-04 DIAGNOSIS — Z7985 Long-term (current) use of injectable non-insulin antidiabetic drugs: Secondary | ICD-10-CM

## 2024-04-04 DIAGNOSIS — Z1331 Encounter for screening for depression: Secondary | ICD-10-CM | POA: Diagnosis not present

## 2024-04-04 DIAGNOSIS — E785 Hyperlipidemia, unspecified: Secondary | ICD-10-CM

## 2024-04-04 DIAGNOSIS — G4733 Obstructive sleep apnea (adult) (pediatric): Secondary | ICD-10-CM | POA: Diagnosis not present

## 2024-04-04 DIAGNOSIS — Z6841 Body Mass Index (BMI) 40.0 and over, adult: Secondary | ICD-10-CM

## 2024-04-04 DIAGNOSIS — I10 Essential (primary) hypertension: Secondary | ICD-10-CM | POA: Diagnosis not present

## 2024-04-04 DIAGNOSIS — E66813 Obesity, class 3: Secondary | ICD-10-CM

## 2024-04-04 DIAGNOSIS — R5383 Other fatigue: Secondary | ICD-10-CM | POA: Diagnosis not present

## 2024-04-04 DIAGNOSIS — E1169 Type 2 diabetes mellitus with other specified complication: Secondary | ICD-10-CM | POA: Diagnosis not present

## 2024-04-04 DIAGNOSIS — R0602 Shortness of breath: Secondary | ICD-10-CM

## 2024-04-04 DIAGNOSIS — D509 Iron deficiency anemia, unspecified: Secondary | ICD-10-CM

## 2024-04-04 NOTE — Assessment & Plan Note (Signed)
 Type 2 diabetes with poor control, evidenced by an A1c of 8.4 and a blood sugar level of 223. Presence of proteinuria indicating early signs of kidney problems. Current treatment includes Ozempic , with previous use of Mounjaro  hindered by insurance coverage. Emphasis on reducing carbohydrate intake to manage insulin  levels and blood sugar. Discussed the importance of managing diabetes to prevent progression of complications - Continue Ozempic  and discuss with primary care team about managing dosage adjustments. - Order insulin  level test to assess current insulin  production. - Educate on the importance of reducing carbohydrate intake to manage diabetes and prevent insulin  resistance. -Patient and mother educated on the carb insulin  model for obesity - Monitor kidney function due to proteinuria.

## 2024-04-04 NOTE — Assessment & Plan Note (Signed)
 Iron deficiency anemia with low hemoglobin levels. Non-compliance with iron supplementation noted. Anemia may contribute to fatigue and reduced energy levels. - Encourage adherence to iron supplementation to address anemia.

## 2024-04-04 NOTE — Assessment & Plan Note (Addendum)
 Uncontrolled.  Adherence may be low.  She also has a small amount of microalbuminuria.  She had a normal GFR recently she is currently on nifedipine , enalapril , labetalol  and amlodipine  we may not be taking medications on a regular basis.  She has also started semaglutide  which may help with blood pressure control.  Patient will begin with nutritional and behavioral strategies.  We recommend monitoring blood pressure at home to look at trends of her blood pressures above 130/80 she will reach out to her primary care team for medication adjustments.

## 2024-04-04 NOTE — Assessment & Plan Note (Signed)
 Contributing factors: Genetics, low volume of physical activity, untreated sleep apnea, medical comorbidities use of beta-blockers See obesity treatment plan Start 1500-calorie nutrition plan Increase physical activity, combined for goal of 240 minutes a week Continue semaglutide  as per primary care team.  Her insurance did not cover Mounjaro .

## 2024-04-04 NOTE — Progress Notes (Signed)
 [address] [place],  [place], [place] [address]  Office: [phone]  /  Fax: [phone]   Subjective   Initial Visit  Brianna [name] (MR# [id number]) is a 33 y.o. female who presents for evaluation and treatment of obesity and related comorbidities. Current BMI is Body mass index is 52.7 kg/m. [name] has been struggling with her weight for many years and has been unsuccessful in either losing weight, maintaining weight loss, or reaching her healthy weight goal.  [name] is currently in the action stage of change and ready to dedicate time achieving and maintaining a healthier weight. [name] is interested in becoming our patient and working on intensive lifestyle modifications including (but not limited to) diet and exercise for weight loss.  Weight history:  Brianna [name] is a 33 year old female who presents for intake appointment for medical obesity management.  She is accompanied by her mother.   She has a history of obesity with a highest recorded weight of 322 pounds. She managed to reduce her weight to 269 pounds before her pregnancy but has since regained weight. She has not been following any specific nutrition plan recently, although she previously tried the keto diet and was on Mounjaro , which helped her lose weight.  She is now on Ozempic  as her insurance did not cover Mounjaro .   She has a long-standing history of hypertension, reportedly since high school.   She was diagnosed with type 2 diabetes in 2011. Her last recorded HbA1c was 8%, although she managed to lower it from 9% to 7% previously. She is on metformin  and Trulicity  for diabetes management. No complications such as neuropathy, retinopathy, or nephropathy, although she experiences drops in her feet, which she is unsure if related to diabetes or her back issues.   She has sleep apnea diagnosed via a home sleep study in 2022, which showed significant apnea with 29 minutes of oxygen saturation below 88%. She tried  CPAP but found it ineffective and uncomfortable, as reported by her mother. She has not pursued further treatment options.   She has a history of hyperlipidemia, although specific details about her lipid levels were not discussed. She is not currently on any specific treatment for hyperlipidemia.   She experiences fatigue and tiredness, which she attributes to her sleep apnea and possibly her diabetes.      She was referred by: Specialist - surgeon - after back surgery   When asked what else they would like to accomplish? She states: Adopt healthier eating patterns, Improve energy levels and physical activity, Improve existing medical conditions, Improve quality of life, and Improve self-confidence   When asked how has your weight affected you? She states: Has affected self-esteem, Contributed to medical problems, Contributed to orthopedic problems or mobility issues, Having fatigue, Having poor endurance, Problems with eating patterns, and Has affected mood    Weight history: Lifelong   Highest weight: 322   Some associated conditions: Hypertension, Arthritis:back, Hyperlipidemia, OSA, and Diabetes   Contributing factors: family history of obesity, disruption of circadian rhythm / sleep disordered breathing, consumption of processed foods, moderate to high levels of stress, reduced physical acitivity, eating patterns, and strong orexigenic signaling and/or inadequate inhibitory control    Weight promoting medications identified: Other: had been on gabapentin  previously   Prior weight loss attempts: None   Current nutrition plan: None   Current level of physical activity: Low levels of physical activity at present   Current or previous pharmacotherapy: GLP-1 - monjauro   Response  to medication: Lost weight initially but was unable to sustain weight loss  Nutritional History:  Current nutrition plan: None.  How many times do you eat outside the home: 5-7 per week  How often do  they skip meals: skips breakfast  What beverages do they drink: regular soda , juice, and milk.   Use of artificial sweetners : No  Food intolerances or dislikes: fruit.  Food triggers: Stress and Boredom.  Food cravings: Starches / Carbohydrates  Do they struggle with excessive hunger or portion control : Yes    Physical Activity:  Current level of physical activity: Low levels of physical activity at present  Barriers to Exercise: energy   Past medical history includes:   Past Medical History:  Diagnosis Date   Anemia    Iron Deficiency Anemia   Back pain    Diabetes mellitus without complication (HCC)    Hypertension    Hypothyroidism    Obesity    Pain    back pain, dx. herniated nucleus polpusos   Sleep apnea    Unable to tolerate CPAP   Thyroid  disease      Objective   BP (!) 142/86   Temp 98.7 F (37.1 C)   Ht 5' 4 (1.626 m)   Wt (!) 307 lb (139.3 kg)   LMP 02/04/2024   SpO2 98%   BMI 52.70 kg/m  She was weighed on the bioimpedance scale: Body mass index is 52.7 kg/m.    Anthropometrics:  Vitals Temp: 98.7 F (37.1 C) BP: (!) 142/86 SpO2: 98 %   Anthropometric Measurements Height: 5' 4 (1.626 m) Weight: (!) 307 lb (139.3 kg) BMI (Calculated): 52.67 Starting Weight: 307 lb Peak Weight: 322 lb Waist Measurement : 57 inches   Body Composition  Body Fat %: 56.4 % Fat Mass (lbs): 173.2 lbs Muscle Mass (lbs): 127.2 lbs Visceral Fat Rating : 19   Other Clinical Data RMR: 2506 Fasting: yes Labs: yes Today's Visit #: 1 Starting Date: 04/04/24    Physical Exam:  General: She is overweight, cooperative, alert, well developed, and in no acute distress. PSYCH: Has normal mood, affect and thought process.   HEENT: EOMI, sclerae are anicteric. Lungs: Normal breathing effort, no conversational dyspnea. Extremities: No edema.  Neurologic: No gross sensory or motor deficits. No tremors or fasciculations noted.    Diagnostic Data  Reviewed  EKG: Normal sinus rhythm, rate 89 bpm. No conduction abnormalities, abnormal Q waves or chamber enlargement.  Indirect Calorimeter completed today shows a VO2 of 363 and a REE of 2506.  Her calculated basal metabolic rate is 7983 thus her resting energy expenditure faster than calculated.  Depression Screen  Riyanshi's PHQ-9 score was: 1.     04/04/2024    7:27 AM  Depression screen PHQ 2/9  Decreased Interest 0  Down, Depressed, Hopeless 0  PHQ - 2 Score 0  Altered sleeping 0  Tired, decreased energy 0  Change in appetite 1  Feeling bad or failure about yourself  0  Trouble concentrating 0  Moving slowly or fidgety/restless 0  Suicidal thoughts 0  PHQ-9 Score 1  Difficult doing work/chores Not difficult at all    Screening for Sleep Related Breathing Disorders  See scanned questionnaire Epworth Sleepiness Score is 13.   BMET    Component Value Date/Time   NA 136 03/08/2024 0911   K 4.5 03/08/2024 0911   CL 102 03/08/2024 0911   CO2 20 03/08/2024 0911   GLUCOSE 223 (H) 03/08/2024 0911  GLUCOSE 124 (H) 10/03/2023 0847   BUN 8 03/08/2024 0911   CREATININE 0.58 03/08/2024 0911   CALCIUM 8.7 03/08/2024 0911   GFRNONAA >60 10/03/2023 0847   GFRAA 153 05/22/2020 1026   Lab Results  Component Value Date   HGBA1C 8.4 (H) 03/08/2024   HGBA1C 11.3 (H) 09/03/2014   No results found for: INSULIN  CBC    Component Value Date/Time   WBC 4.7 03/08/2024 0911   WBC 4.8 08/08/2023 0306   RBC 3.97 03/08/2024 0911   RBC 3.93 08/08/2023 0306   HGB 10.7 (L) 03/08/2024 0911   HCT 34.6 03/08/2024 0911   PLT 230 03/08/2024 0911   MCV 87 03/08/2024 0911   MCH 27.0 03/08/2024 0911   MCH 28.0 08/08/2023 0306   MCHC 30.9 (L) 03/08/2024 0911   MCHC 32.4 08/08/2023 0306   RDW 13.9 03/08/2024 0911   Iron/TIBC/Ferritin/ %Sat No results found for: IRON, TIBC, FERRITIN, IRONPCTSAT Lipid Panel     Component Value Date/Time   CHOL 171 03/08/2024 0911   TRIG 42  03/08/2024 0911   HDL 47 03/08/2024 0911   CHOLHDL 3.6 03/08/2024 0911   CHOLHDL 3.4 09/05/2014 0550   VLDL 15 09/05/2014 0550   LDLCALC 115 (H) 03/08/2024 0911   Hepatic Function Panel     Component Value Date/Time   PROT 7.4 03/08/2024 0911   ALBUMIN  4.1 03/08/2024 0911   AST 9 03/08/2024 0911   ALT 16 03/08/2024 0911   ALKPHOS 69 03/08/2024 0911   BILITOT 0.2 03/08/2024 0911   BILIDIR 0.12 08/29/2023 0935      Component Value Date/Time   TSH 1.740 08/29/2023 0935     Assessment and Plan   TREATMENT PLAN FOR OBESITY:  Recommended Dietary Goals  Marlana is currently in the action stage of change. As such, her goal is to implement medically supervised obesity management plan.  She has agreed to implement: the Category 3 plan - 1500 kcal per day  Behavioral Intervention  We discussed the following Behavioral Modification Strategies today: increasing lean protein intake to established goals, decreasing simple carbohydrates , increasing vegetables, increasing lower glycemic fruits, increasing fiber rich foods, avoiding skipping meals, increasing water intake, work on meal planning and preparation, work on tracking and journaling calories using tracking application, reading food labels , keeping healthy foods at home, identifying sources and decreasing liquid calories, decreasing eating out or consumption of processed foods, and making healthy choices when eating convenient foods, planning for success, and better snacking choices  Additional resources provided today: Handout on healthy eating and balanced plate, Handout on complex carbohydrates and lean sources of protein, Category 3 packet, and Handout principles of weight management  Recommended Physical Activity Goals  Tayler has been advised to work up to 150 minutes of moderate intensity aerobic activity a week and strengthening exercises 2-3 times per week for cardiovascular health, weight loss maintenance and preservation  of muscle mass.   She has agreed to :  Think about enjoyable ways to increase daily physical activity and overcoming barriers to exercise, Increase physical activity in their day and reduce sedentary time (increase NEAT)., Increase volume of physical activity to a goal of 240 minutes a week, and Combine aerobic and strengthening exercises for efficiency and improved cardiometabolic health.  Medical Interventions and Pharmacotherapy We will work on building a Therapist, art and behavioral strategies. We will discuss the role of pharmacotherapy as an adjunct at subsequent visits.   ASSOCIATED CONDITIONS ADDRESSED TODAY  Other Fatigue She has  an Epworth of 13.  She also has iron deficiency anemia, low volume of physical activity and uncontrolled diabetes.  These all can contribute to fatigue Tacori does feel that her weight is causing her energy to be lower than it should be. Fatigue may be related to obesity, depression or many other causes. Labs will be ordered, and in the meanwhile, Tajanay will focus on self care including making healthy food choices, increasing physical activity and focusing on stress reduction.  Shortness of Breath Adiba notes increasing shortness of breath with physical activity and seems to be worsening over time with weight gain. She notes getting out of breath sooner with activity than she used to. This has not gotten worse recently. Brianna denies shortness of breath at rest or orthopnea.  Assessment & Plan Other fatigue  SOB (shortness of breath) on exertion  Depression screen  Primary hypertension Uncontrolled.  Adherence may be low.  She also has a small amount of microalbuminuria.  She had a normal GFR recently she is currently on nifedipine , enalapril , labetalol  and amlodipine  we may not be taking medications on a regular basis.  She has also started semaglutide  which may help with blood pressure control.  Patient will begin with nutritional  and behavioral strategies.  We recommend monitoring blood pressure at home to look at trends of her blood pressures above 130/80 she will reach out to her primary care team for medication adjustments. Obstructive sleep apnea Untreated.  We discussed the bidirectional relationship between weight and sleep apnea.  This will make weight loss very difficult and also have an effect on other cardiometabolic risk.  I have asked her to reconsider trying CPAP treatment again.  She may also benefit from gastric bypass considering degree of obesity and uncontrolled diabetes. Type 2 diabetes mellitus with other specified complication, without long-term current use of insulin  (HCC) Type 2 diabetes with poor control, evidenced by an A1c of 8.4 and a blood sugar level of 223. Presence of proteinuria indicating early signs of kidney problems. Current treatment includes Ozempic , with previous use of Mounjaro  hindered by insurance coverage. Emphasis on reducing carbohydrate intake to manage insulin  levels and blood sugar. Discussed the importance of managing diabetes to prevent progression of complications - Continue Ozempic  and discuss with primary care team about managing dosage adjustments. - Order insulin  level test to assess current insulin  production. - Educate on the importance of reducing carbohydrate intake to manage diabetes and prevent insulin  resistance. -Patient and mother educated on the carb insulin  model for obesity - Monitor kidney function due to proteinuria. Class 3 severe obesity with serious comorbidity and body mass index (BMI) of 50.0 to 59.9 in adult, unspecified obesity type Contributing factors: Genetics, low volume of physical activity, untreated sleep apnea, medical comorbidities use of beta-blockers See obesity treatment plan Start 1500-calorie nutrition plan Increase physical activity, combined for goal of 240 minutes a week Continue semaglutide  as per primary care team.  Her insurance did  not cover Mounjaro . Hyperlipidemia associated with type 2 diabetes mellitus (HCC) LDL is not at goal. Elevated LDL may be secondary to nutrition, genetics and spillover effect from excess adiposity. Recommended LDL goal is <70 to reduce the risk of fatty streaks and the progression to obstructive ASCVD in the future.   Her 10 year risk is: The ASCVD Risk score (Arnett DK, et al., 2019) failed to calculate for the following reasons:   The 2019 ASCVD risk score is only valid for ages 23 to 27  Lab Results  Component Value Date   CHOL 171 03/08/2024   HDL 47 03/08/2024   LDLCALC 115 (H) 03/08/2024   TRIG 42 03/08/2024   CHOLHDL 3.6 03/08/2024    Continue weight loss therapy, losing 10% or more of body weight may improve condition. Also advised to reduce saturated fats in diet to less than 10% of daily calories.      Iron deficiency anemia, unspecified iron deficiency anemia type Iron deficiency anemia with low hemoglobin levels. Non-compliance with iron supplementation noted. Anemia may contribute to fatigue and reduced energy levels. - Encourage adherence to iron supplementation to address anemia.  Assessment and Plan    Follow-up  She was informed of the importance of frequent follow-up visits to maximize her success with intensive lifestyle modifications for her multiple health conditions. She was informed we would discuss her lab results at her next visit unless there is a critical issue that needs to be addressed sooner. Jill agreed to keep her next visit at the agreed upon time to discuss these results.  Attestation Statement  This is the patient's intake visit at [facility]. The patient's Health Questionnaire was reviewed at length. Included in the packet: current and past health history, medications, allergies, ROS, gynecologic history (women only), surgical history, family history, social history, weight history, weight loss surgery history (for those that  have had weight loss surgery), nutritional evaluation, mood and food questionnaire, PHQ9, Epworth questionnaire, sleep habits questionnaire, patient life and health improvement goals questionnaire. These will all be scanned into the patient's chart under media.   During the visit, I independently reviewed the patient's EKG, previous labs, bioimpedance scale results, and indirect calorimetry results. I used this information to medically tailor a meal plan for the patient that will help her to lose weight and will improve her obesity-related conditions. I performed a medically necessary appropriate examination and/or evaluation. I discussed the assessment and treatment plan with the patient. The patient was provided an opportunity to ask questions and all were answered. The patient agreed with the plan and demonstrated an understanding of the instructions. Labs were ordered at this visit and will be reviewed at the next visit unless critical results need to be addressed immediately. Clinical information was updated and documented in the EMR.   In addition, they received basic education on identification of processed foods and reduction of these, different sources of lean proteins and complex carbohydrates and how to eat balanced by incorporation of whole foods.  Reviewed by clinician on day of visit: allergies, medications, problem list, medical history, surgical history, family history, social history, and previous encounter notes.  I have spent 56 minutes in the care of the patient today including: 5 minutes before the visit reviewing and preparing the chart. 42 minutes face-to-face assessing and reviewing listed medical problems as outlined in obesity care plan, providing nutritional and behavioral counseling on topics outlined in the obesity care plan, counseling regarding anti-obesity medication as outlined in obesity care plan, independently interpreting test results and goals of care, as described in  assessment and plan, reviewing and discussing biometric information and progress, reviewing latest PCP notes and specialist consultations, and ordering diagnostics - see orders 9 minutes after the visit updating chart and documentation of encounter.       Lucas Parker, MD

## 2024-04-04 NOTE — Assessment & Plan Note (Signed)
 Untreated.  We discussed the bidirectional relationship between weight and sleep apnea.  This will make weight loss very difficult and also have an effect on other cardiometabolic risk.  I have asked her to reconsider trying CPAP treatment again.  She may also benefit from gastric bypass considering degree of obesity and uncontrolled diabetes.

## 2024-04-04 NOTE — Assessment & Plan Note (Signed)
 LDL is not at goal. Elevated LDL may be secondary to nutrition, genetics and spillover effect from excess adiposity. Recommended LDL goal is <70 to reduce the risk of fatty streaks and the progression to obstructive ASCVD in the future.   Her 10 year risk is: The ASCVD Risk score (Arnett DK, et al., 2019) failed to calculate for the following reasons:   The 2019 ASCVD risk score is only valid for ages 66 to 107  Lab Results  Component Value Date   CHOL 171 03/08/2024   HDL 47 03/08/2024   LDLCALC 115 (H) 03/08/2024   TRIG 42 03/08/2024   CHOLHDL 3.6 03/08/2024    Continue weight loss therapy, losing 10% or more of body weight may improve condition. Also advised to reduce saturated fats in diet to less than 10% of daily calories.

## 2024-04-05 LAB — VITAMIN D 25 HYDROXY (VIT D DEFICIENCY, FRACTURES): Vit D, 25-Hydroxy: 16.8 ng/mL — ABNORMAL LOW (ref 30.0–100.0)

## 2024-04-05 LAB — INSULIN, RANDOM: INSULIN: 16.1 u[IU]/mL (ref 2.6–24.9)

## 2024-04-05 LAB — VITAMIN B12: Vitamin B-12: 806 pg/mL (ref 232–1245)

## 2024-04-18 ENCOUNTER — Ambulatory Visit (INDEPENDENT_AMBULATORY_CARE_PROVIDER_SITE_OTHER): Admitting: Internal Medicine

## 2024-04-18 ENCOUNTER — Encounter (INDEPENDENT_AMBULATORY_CARE_PROVIDER_SITE_OTHER): Payer: Self-pay | Admitting: Internal Medicine

## 2024-04-18 VITALS — BP 154/100 | HR 107 | Temp 98.2°F | Ht 64.0 in | Wt 296.0 lb

## 2024-04-18 DIAGNOSIS — G4733 Obstructive sleep apnea (adult) (pediatric): Secondary | ICD-10-CM | POA: Diagnosis not present

## 2024-04-18 DIAGNOSIS — E1169 Type 2 diabetes mellitus with other specified complication: Secondary | ICD-10-CM | POA: Diagnosis not present

## 2024-04-18 DIAGNOSIS — Z6841 Body Mass Index (BMI) 40.0 and over, adult: Secondary | ICD-10-CM

## 2024-04-18 DIAGNOSIS — Z7984 Long term (current) use of oral hypoglycemic drugs: Secondary | ICD-10-CM

## 2024-04-18 DIAGNOSIS — D509 Iron deficiency anemia, unspecified: Secondary | ICD-10-CM

## 2024-04-18 DIAGNOSIS — I10 Essential (primary) hypertension: Secondary | ICD-10-CM

## 2024-04-18 DIAGNOSIS — Z7985 Long-term (current) use of injectable non-insulin antidiabetic drugs: Secondary | ICD-10-CM

## 2024-04-18 DIAGNOSIS — E785 Hyperlipidemia, unspecified: Secondary | ICD-10-CM

## 2024-04-18 DIAGNOSIS — E66813 Obesity, class 3: Secondary | ICD-10-CM

## 2024-04-18 DIAGNOSIS — E559 Vitamin D deficiency, unspecified: Secondary | ICD-10-CM

## 2024-04-18 MED ORDER — NEBIVOLOL HCL 2.5 MG PO TABS: 2.5000 mg | ORAL_TABLET | Freq: Every day | ORAL | 0 refills | Status: DC

## 2024-04-18 MED ORDER — VITAMIN D (ERGOCALCIFEROL) 1.25 MG (50000 UNIT) PO CAPS: 50000.0000 [IU] | ORAL_CAPSULE | ORAL | 0 refills | Status: DC

## 2024-04-18 NOTE — Assessment & Plan Note (Addendum)
 Untreated.  We discussed the bidirectional relationship between weight and sleep apnea.  This will make weight loss very difficult and also have an effect on other cardiometabolic risk.  I have asked her to reconsider trying CPAP treatment again.  She may also benefit from gastric bypass considering degree of obesity and uncontrolled diabetes.

## 2024-04-18 NOTE — Progress Notes (Unsigned)
 Office: [phone]  /  Fax: [phone]  Weight Summary and Body Composition Analysis (BIA)  Vitals Temp: 98.2 F (36.8 C) BP: (!) 161/101 Pulse Rate: (!) 107 SpO2: 99 %   Anthropometric Measurements Height: 5' 4 (1.626 m) Weight: 296 lb (134.3 kg) BMI (Calculated): 50.78 Weight at Last Visit: 307 lb Weight Lost Since Last Visit: 11 lb Weight Gained Since Last Visit: 0 0lb Starting Weight: 307 lb Total Weight Loss (lbs): 11 lb (4.99 kg) Peak Weight: 322 lb   Body Composition  Body Fat %: 55.8 % Fat Mass (lbs): 165.2 lbs Muscle Mass (lbs): 124.2 lbs Visceral Fat Rating : 18    RMR: [id number]  Today's Visit #: 3  Starting Date: 04/04/24   Subjective   Chief Complaint: Obesity  Interval History Discussed the use of AI scribe software for clinical note transcription with the patient, who gave verbal consent to proceed.  History of Present Illness Brianna [name] is a 33 year old female with type 2 diabetes, untreated sleep apnea, uncontrolled hypertension, and hyperlipidemia who presents for medical weight management.  She has suboptimal adherence with her medications.  She has lost eleven pounds by resuming workouts and adhering to meal plans. She is engaging in physical activity, working out five times a week using an elliptical and treadmill at home. She has been using [name]  for weight management, having completed three injections, and experiences constipation as a side effect.  She has type 2 diabetes with a previous blood sugar level of 223 mg/dL and an J8r of 1.5% as of August. She is currently taking metformin .  Her hypertension is uncontrolled, with recent blood pressure readings around 140/82 mmHg. She is taking amlodipine  and another unspecified medication for hypertension. She is not taking labetalol , despite it being listed in her medication list, and is unsure about the correct dosage of her current medications.  She reports untreated sleep apnea  and has reached out about getting back on CPAP therapy after discussion last office visit. She has ordered a mouth guard to assist with her sleep apnea.  She has hyperlipidemia with a slightly elevated cholesterol level, but is not currently on cholesterol medication due to her age. She is aware of dietary changes needed to manage her cholesterol levels.  She experiences constipation, describing her bowel movements as 'small' and occurring every other day.  She has a history of vitamin D  deficiency and is currently taking over-the-counter vitamin D  supplements. Her vitamin D  levels are low, and she has been prescribed a high-dose vitamin D  regimen for four months.     Challenges affecting patient progress: medical comorbidities and suboptimal adherence with some of her medications.    Pharmacotherapy for weight management: She is currently taking Metformin  with diabetes as primary indication with adequate clinical response  and without side effects. and Ozempic  with diabetes as the primary indication and obesity secondary with adequate clinical response  and without side effects.  Medications are being written for by primary care team  Assessment and Plan   Treatment Plan For Obesity:  Recommended Dietary Goals  Brianna [name] is currently in the action stage of change. As such, her goal is to continue weight management plan. She has agreed to: continue current plan  Behavioral Health and Counseling  We discussed the following behavioral modification strategies today: continue to work on maintaining a reduced calorie state, getting the recommended amount of protein, incorporating whole foods, making healthy choices, staying well hydrated and practicing mindfulness when eating. and increase  protein intake, fibrous foods (25 grams per day for women, 30 grams for men) and water to improve satiety and decrease hunger signals. .  Additional education and resources provided today: None  Recommended  Physical Activity Goals  Brianna [name] has been advised to work up to 150 minutes of moderate intensity aerobic activity a week and strengthening exercises 2-3 times per week for cardiovascular health, weight loss maintenance and preservation of muscle mass.  She has agreed to :  Think about enjoyable ways to increase daily physical activity and overcoming barriers to exercise, Increase physical activity in their day and reduce sedentary time (increase NEAT)., Increase volume of physical activity to a goal of 240 minutes a week, and Combine aerobic and strengthening exercises for efficiency and improved cardiometabolic health.  Medical Interventions and Pharmacotherapy  We discussed various medication options to help Brianna [name] with her weight loss efforts and we both agreed to : Increase semaglutide  to 1 mg once a week medication will be prescribed by primary care team unless the preferred for us  to take over.  She will continue on metformin .  Associated Conditions Impacted by Obesity Treatment  Assessment & Plan Primary hypertension Blood pressure at home is reported 140/82. Medication adherence issues identified. Current regimen includes amlodipine  and enalapril . Labetalol  was prescribed at a very high dose but she is only been taking 1 tablet daily.  I am concerned about her taking the prescribed dosage.  Propranolol may also contribute to weight gain.  I counseled patient and mother on the risk associated with uncontrolled blood pressure.  I have also taken the liberty of starting her on Bystolic  (nebivolol ) which is once a day and weight neutral.  Emphasized daily medication adherence to prevent rebound hypertension. - Start Bystolic  (nebivolol ) in the evening - Discontinue labetalol  as she was not taking correctly and may cause weight gain -Continue Vasotec  and amlodipine  as per primary care team.  She may benefit from a thiazide diuretic.  May benefit from switching to an ARB hydrochlorothiazide  combination. - Encourage daily medication adherence - Monitor blood pressure regularly if her blood pressure remains above 140/90 I would like for her to schedule an appointment with her primary care team for further medication adjustments - Advise on reducing sodium intake Vitamin D  deficiency Most recent vitamin D  levels  Lab Results  Component Value Date   VD25OH 16.8 (L) 04/04/2024   VD25OH 78.7 12/06/2019     Deficiency state associated with adiposity and may result in leptin resistance, weight gain and fatigue.   Plan: After discussion of benefits, alternative treatment options and side effects patient will be started on vitamin D2 50,000 units 1 tablet weekly for 3-4 months. for a treatment goal level of 50-60 mg/dl. Check levels at that time for response monitoring.  Obstructive sleep apnea Untreated.  She has severe OSA with oxygen desaturation.  We discussed the bidirectional relationship between weight and sleep apnea.  This will make weight loss very difficult and also have an effect on other cardiometabolic risk.  I have asked her to reconsider trying CPAP treatment again.  She has reach out to responsible entity but has not had a follow-up.  She benefits from GLP-1 aided weight loss. Type 2 diabetes mellitus with other specified complication, without long-term current use of insulin  (HCC) A1c is 8.4, indicating poor control. Blood sugar was 223 in August. High insulin  levels suggest insulin  resistance. Discussed risk of complications  and emphasized dietary changes for better control. - Continue metformin  - Continue to  adjust Ozempic  as per primary care team - Has been counseled on GLP-1 treatment as well as nutritional strategies to avoid side effects - Encourage dietary changes to reduce saturated fats, simple and added sugars and increasing whole foods. Class 3 severe obesity with serious comorbidity and body mass index (BMI) of 50.0 to 59.9 in adult, unspecified obesity  type Significant weight loss of 11 pounds achieved. She is adhering to meal plans and regular exercise. Mild constipation is the only adverse effect from [name] . Gradual weight loss is encouraged to minimize skin redundancy, with potential for contouring surgery discussed. - Continue [name]  0.5 mg, adjust to 1 mg with next refill - Encourage continuation of exercise regimen, aiming for 150 minutes of aerobic activity per week and 2-3 strengthening sessions - Advise on gradual weight loss to minimize skin redundancy - Discuss potential for contouring surgery after significant weight loss Iron deficiency anemia, unspecified iron deficiency anemia type Hemoglobin is 10.7, indicating anemia. Iron supplementation has been started. - She has now restarted taking her iron supplementation as recommended by her primary care team. Hyperlipidemia associated with type 2 diabetes mellitus (HCC) Cholesterol is mildly elevated with LDL at 115. No current cholesterol medication due to age. Dietary changes discussed to reduce cholesterol levels. - Encourage dietary changes to reduce saturated fats - Metformin  helps with LDL cholesterol management.           Objective   Physical Exam:  Blood pressure (!) 161/101, pulse (!) 107, temperature 98.2 F (36.8 C), height 5' 4 (1.626 m), weight 296 lb (134.3 kg), last menstrual period 04/18/2024, SpO2 99%. Body mass index is 50.81 kg/m.  General: She is overweight, cooperative, alert, well developed, and in no acute distress. PSYCH: Has normal mood, affect and thought process.   HEENT: EOMI, sclerae are anicteric. Lungs: Normal breathing effort, no conversational dyspnea. Extremities: No edema.  Neurologic: No gross sensory or motor deficits. No tremors or fasciculations noted.    Diagnostic Data Reviewed:  BMET    Component Value Date/Time   NA 136 03/08/2024 0911   K 4.5 03/08/2024 0911   CL 102 03/08/2024 0911   CO2 20 03/08/2024 0911    GLUCOSE 223 (H) 03/08/2024 0911   GLUCOSE 124 (H) 10/03/2023 0847   BUN 8 03/08/2024 0911   CREATININE 0.58 03/08/2024 0911   CALCIUM 8.7 03/08/2024 0911   GFRNONAA >60 10/03/2023 0847   GFRAA 153 05/22/2020 1026   Lab Results  Component Value Date   HGBA1C 8.4 (H) 03/08/2024   HGBA1C 11.3 (H) 09/03/2014   Lab Results  Component Value Date   INSULIN  16.1 04/04/2024   Lab Results  Component Value Date   TSH 1.740 08/29/2023   CBC    Component Value Date/Time   WBC 4.7 03/08/2024 0911   WBC 4.8 08/08/2023 0306   RBC 3.97 03/08/2024 0911   RBC 3.93 08/08/2023 0306   HGB 10.7 (L) 03/08/2024 0911   HCT 34.6 03/08/2024 0911   PLT 230 03/08/2024 0911   MCV 87 03/08/2024 0911   MCH 27.0 03/08/2024 0911   MCH 28.0 08/08/2023 0306   MCHC 30.9 (L) 03/08/2024 0911   MCHC 32.4 08/08/2023 0306   RDW 13.9 03/08/2024 0911   Iron Studies No results found for: IRON, TIBC, FERRITIN, IRONPCTSAT Lipid Panel     Component Value Date/Time   CHOL 171 03/08/2024 0911   TRIG 42 03/08/2024 0911   HDL 47 03/08/2024 0911   CHOLHDL 3.6 03/08/2024 0911   CHOLHDL 3.4  09/05/2014 0550   VLDL 15 09/05/2014 0550   LDLCALC 115 (H) 03/08/2024 0911   Hepatic Function Panel     Component Value Date/Time   PROT 7.4 03/08/2024 0911   ALBUMIN  4.1 03/08/2024 0911   AST 9 03/08/2024 0911   ALT 16 03/08/2024 0911   ALKPHOS 69 03/08/2024 0911   BILITOT 0.2 03/08/2024 0911   BILIDIR 0.12 08/29/2023 0935      Component Value Date/Time   TSH 1.740 08/29/2023 0935   Nutritional Lab Results  Component Value Date   VD25OH 16.8 (L) 04/04/2024   VD25OH 78.7 12/06/2019    Medications: Outpatient Encounter Medications as of 04/18/2024  Medication Sig   amLODipine  (NORVASC ) 10 MG tablet TAKE 1 TABLET BY MOUTH EVERY DAY   blood glucose meter kit and supplies KIT Dispense based on patient and insurance preference. Test blood sugar once daily. Diabetes type 2. E11.9 (Patient not taking:  Reported on 04/04/2024)   enalapril  (VASOTEC ) 20 MG tablet TAKE 1 TABLET BY MOUTH EVERY DAY (Patient not taking: Reported on 04/04/2024)   fluconazole  (DIFLUCAN ) 150 MG tablet One po qd prn yeast infection; may repeat in 3-4 days if needed   labetalol  (NORMODYNE ) 200 MG tablet Take 800 mg by mouth 3 (three) times daily. (Patient not taking: Reported on 04/18/2024)   Lancets (FREESTYLE) lancets Use as instructed (Patient not taking: Reported on 04/04/2024)   levothyroxine  (SYNTHROID ) 75 MCG tablet TAKE 1 TABLET BY MOUTH EVERY DAY (Patient not taking: Reported on 04/18/2024)   meloxicam  (MOBIC ) 15 MG tablet Take 15 mg by mouth daily. (Patient not taking: Reported on 04/04/2024)   metFORMIN  (GLUCOPHAGE -XR) 500 MG 24 hr tablet TAKE 2 TABLETS BY MOUTH EVERY DAY WITH BREAKFAST (Patient not taking: Reported on 04/18/2024)   NIFEdipine  (PROCARDIA  XL/NIFEDICAL XL) 60 MG 24 hr tablet Take 60 mg by mouth daily. (Patient not taking: Reported on 04/18/2024)   norethindrone  (MICRONOR ) 0.35 MG tablet Take 1 tablet by mouth daily.   Semaglutide ,0.25 or 0.5MG /DOS, 2 MG/3ML SOPN Inject 0.5 mg into the skin once a week.   No facility-administered encounter medications on file as of 04/18/2024.     Follow-Up   No follow-ups on file.SABRA She was informed of the importance of frequent follow up visits to maximize her success with intensive lifestyle modifications for her multiple health conditions.  Attestation Statement   Reviewed by clinician on day of visit: allergies, medications, problem list, medical history, surgical history, family history, social history, and previous encounter notes.     Lucas Parker, MD

## 2024-04-19 ENCOUNTER — Encounter (INDEPENDENT_AMBULATORY_CARE_PROVIDER_SITE_OTHER): Payer: Self-pay | Admitting: Internal Medicine

## 2024-04-19 NOTE — Assessment & Plan Note (Signed)
 A1c is 8.4, indicating poor control. Blood sugar was 223 in August. High insulin  levels suggest insulin  resistance. Discussed risk of complications  and emphasized dietary changes for better control. - Continue metformin  - Continue to adjust Ozempic  as per primary care team - Has been counseled on GLP-1 treatment as well as nutritional strategies to avoid side effects - Encourage dietary changes to reduce saturated fats, simple and added sugars and increasing whole foods.

## 2024-04-19 NOTE — Assessment & Plan Note (Signed)
 Blood pressure at home is reported 140/82. Medication adherence issues identified. Current regimen includes amlodipine  and enalapril . Labetalol  was prescribed at a very high dose but she is only been taking 1 tablet daily.  I am concerned about her taking the prescribed dosage.  Propranolol may also contribute to weight gain.  I counseled patient and mother on the risk associated with uncontrolled blood pressure.  I have also taken the liberty of starting her on Bystolic  (nebivolol ) which is once a day and weight neutral.  Emphasized daily medication adherence to prevent rebound hypertension. - Start Bystolic  (nebivolol ) in the evening - Discontinue labetalol  as she was not taking correctly and may cause weight gain -Continue Vasotec  and amlodipine  as per primary care team.  She may benefit from a thiazide diuretic.  May benefit from switching to an ARB hydrochlorothiazide combination. - Encourage daily medication adherence - Monitor blood pressure regularly if her blood pressure remains above 140/90 I would like for her to schedule an appointment with her primary care team for further medication adjustments - Advise on reducing sodium intake

## 2024-04-19 NOTE — Assessment & Plan Note (Signed)
 Cholesterol is mildly elevated with LDL at 115. No current cholesterol medication due to age. Dietary changes discussed to reduce cholesterol levels. - Encourage dietary changes to reduce saturated fats - Metformin  helps with LDL cholesterol management.

## 2024-04-19 NOTE — Assessment & Plan Note (Signed)
 Hemoglobin is 10.7, indicating anemia. Iron supplementation has been started. - She has now restarted taking her iron supplementation as recommended by her primary care team.

## 2024-04-19 NOTE — Assessment & Plan Note (Signed)
 Significant weight loss of 11 pounds achieved. She is adhering to meal plans and regular exercise. Mild constipation is the only adverse effect from Wegovy . Gradual weight loss is encouraged to minimize skin redundancy, with potential for contouring surgery discussed. - Continue Wegovy  0.5 mg, adjust to 1 mg with next refill - Encourage continuation of exercise regimen, aiming for 150 minutes of aerobic activity per week and 2-3 strengthening sessions - Advise on gradual weight loss to minimize skin redundancy - Discuss potential for contouring surgery after significant weight loss

## 2024-04-22 ENCOUNTER — Other Ambulatory Visit: Payer: Self-pay | Admitting: Nurse Practitioner

## 2024-04-22 ENCOUNTER — Encounter: Payer: Self-pay | Admitting: Nurse Practitioner

## 2024-04-22 MED ORDER — OZEMPIC (1 MG/DOSE) 4 MG/3ML ~~LOC~~ SOPN: 1.0000 mg | PEN_INJECTOR | SUBCUTANEOUS | 2 refills | Status: DC

## 2024-04-22 NOTE — Telephone Encounter (Signed)
 Called pt, she will have PCP fill this time and then have Dr. CHRISTELLA take over

## 2024-04-25 ENCOUNTER — Encounter (INDEPENDENT_AMBULATORY_CARE_PROVIDER_SITE_OTHER): Payer: Self-pay | Admitting: Internal Medicine

## 2024-05-07 ENCOUNTER — Ambulatory Visit: Admitting: Neurology

## 2024-05-07 ENCOUNTER — Encounter: Payer: Self-pay | Admitting: Neurology

## 2024-05-07 VITALS — BP 141/83 | HR 98 | Ht 64.0 in | Wt 293.0 lb

## 2024-05-07 DIAGNOSIS — G4719 Other hypersomnia: Secondary | ICD-10-CM | POA: Diagnosis not present

## 2024-05-07 DIAGNOSIS — R6889 Other general symptoms and signs: Secondary | ICD-10-CM

## 2024-05-07 DIAGNOSIS — G4733 Obstructive sleep apnea (adult) (pediatric): Secondary | ICD-10-CM | POA: Diagnosis not present

## 2024-05-07 DIAGNOSIS — Z6841 Body Mass Index (BMI) 40.0 and over, adult: Secondary | ICD-10-CM

## 2024-05-07 NOTE — Patient Instructions (Signed)

## 2024-05-07 NOTE — Progress Notes (Signed)
 Subjective:    Patient ID: Brianna [name] is a 33 y.o. female.  HPI    True Mar, MD, PhD [facility] [address], [address] [address] [place], [place] [address]  Dear Brianna [name],  I saw your patient, Brianna [name], upon your kind request in my sleep clinic today for initial consultation of her sleep disorder, in particular, concern for underlying obstructive sleep apnea.  The patient is accompanied by her mom and her child today.  As you know, Brianna [name] is a 33 year old female with an underlying medical history of hypertension, diabetes, hyperlipidemia, and morbid obesity with a BMI of over 50, who was previously diagnosed with obstructive sleep apnea and placed on PAP therapy.  She had a home sleep test on 03/14/2021 which showed an AHI of 27.9/h, O2 nadir 81%.  She is no longer on PAP therapy.  She has not used PAP therapy in about 2 years.  Her Epworth sleepiness score is 13 out of 24, fatigue severity score is 9 out of 63.  She is not aware of any family history of sleep apnea.  She lives with her parents and her 20-year-old son.  He sleeps in a crib in her bedroom.  She is working on weight loss, she has been on Ozempic  for the past month.  She has had weight fluctuation.  She has been losing weight.  She works as a Production designer, theatre/television/film at [employer].  Bedtime is generally around 10 and rise time between 7 and 8.  She has no nightly nocturia and denies any recurrent nocturnal or morning headaches.  She does not drink any caffeine on a daily basis, no alcohol, she is a non-smoker.  She would be willing to get reevaluated for sleep apnea and consider treatment again.  Her biggest difficulty with her PAP therapy was the pressure and tolerating it.  Her Past Medical History Is Significant For: Past Medical History:  Diagnosis Date   Anemia    Iron Deficiency Anemia   Back pain    Diabetes mellitus without complication (HCC)    Hypertension    Hypothyroidism    Obesity     Pain    back pain, dx. herniated nucleus polpusos   Sleep apnea    Unable to tolerate CPAP   Thyroid  disease     Her Past Surgical History Is Significant For: Past Surgical History:  Procedure Laterality Date   CESAREAN SECTION  2023   LUMBAR LAMINECTOMY/DECOMPRESSION MICRODISCECTOMY N/A 09/03/2014   Procedure: MICROLUMBAR DECOMPRESSION LUMBAR FOUR TO FIVE, LUMBAR FIVE TO SACRAL ONE;  Surgeon: Reyes JAYSON Billing, MD;  Location: [facility];  Service: Orthopedics;  Laterality: N/A;   TONSILLECTOMY     with Adenoids   TRANSFORAMINAL LUMBAR INTERBODY FUSION (TLIF) WITH PEDICLE SCREW FIXATION 1 LEVEL N/A 10/09/2023   Procedure: TRANSFORAMINAL LUMBAR INTERBODY FUSION (TLIF) WITH PEDICLE SCREW FIXATION 1 LEVEL L4-5;  Surgeon: Burnetta Aures, MD;  Location: [facility];  Service: Orthopedics;  Laterality: N/A;  6 hrs 3 C-Bed   WISDOM TOOTH EXTRACTION      Her Family History Is Significant For: Family History  Problem Relation Age of Onset   Diabetes Mother    Obesity Mother    Hypertension Mother    Hyperlipidemia Mother    Hypertension Father    Sleep apnea Father    Hyperlipidemia Father    Hypertension Brother    Diabetes Maternal Grandmother    Hypertension Maternal Grandmother    Cancer Maternal Grandmother  Diabetes Maternal Grandfather    Hypertension Maternal Grandfather    Diabetes Paternal Grandmother    Hypertension Paternal Grandmother    Colon cancer Paternal Grandmother    Diabetes Paternal Grandfather    Hypertension Paternal Grandfather     Her Social History Is Significant For: Social History   Socioeconomic History   Marital status: Single    Spouse name: Not on file   Number of children: Not on file   Years of education: Not on file   Highest education level: Not on file  Occupational History   Not on file  Tobacco Use   Smoking status: Never   Smokeless tobacco: Never  Vaping Use   Vaping status: Never Used  Substance and Sexual Activity   Alcohol use:  Not Currently   Drug use: No   Sexual activity: Yes    Birth control/protection: Implant  Other Topics Concern   Not on file  Social History Narrative   Pt lives with family    Pt works    Social Drivers of Corporate investment banker Strain: Low Risk  (08/15/2023)   Overall Financial Resource Strain (CARDIA)    Difficulty of Paying Living Expenses: Not hard at all  Food Insecurity: No Food Insecurity (10/12/2023)   Hunger Vital Sign    Worried About Running Out of Food in the Last Year: Never true    Ran Out of Food in the Last Year: Never true  Transportation Needs: No Transportation Needs (10/12/2023)   PRAPARE - Administrator, Civil Service (Medical): No    Lack of Transportation (Non-Medical): No  Physical Activity: Insufficiently Active (12/16/2021)   Exercise Vital Sign    Days of Exercise per Week: 1 day    Minutes of Exercise per Session: 10 min  Stress: No Stress Concern Present (12/16/2021)   [employer] of Occupational Health - Occupational Stress Questionnaire    Feeling of Stress : Only a little  Social Connections: Moderately Isolated (12/16/2021)   Social Connection and Isolation Panel    Frequency of Communication with Friends and Family: More than three times a week    Frequency of Social Gatherings with Friends and Family: Twice a week    Attends Religious Services: 1 to 4 times per year    Active Member of [employer] or Organizations: No    Attends Banker Meetings: Never    Marital Status: Never married    Her Allergies Are:  No Known Allergies:   Her Current Medications Are:  Outpatient Encounter Medications as of 05/07/2024  Medication Sig   amLODipine  (NORVASC ) 10 MG tablet TAKE 1 TABLET BY MOUTH EVERY DAY   enalapril  (VASOTEC ) 20 MG tablet TAKE 1 TABLET BY MOUTH EVERY DAY   levothyroxine  (SYNTHROID ) 75 MCG tablet TAKE 1 TABLET BY MOUTH EVERY DAY   metFORMIN  (GLUCOPHAGE -XR) 500 MG 24 hr tablet TAKE 2 TABLETS BY MOUTH EVERY  DAY WITH BREAKFAST   nebivolol  (BYSTOLIC ) 2.5 MG tablet Take 1 tablet (2.5 mg total) by mouth daily.   norethindrone  (MICRONOR ) 0.35 MG tablet Take 1 tablet by mouth daily.   Semaglutide , 1 MG/DOSE, (OZEMPIC , 1 MG/DOSE,) 4 MG/3ML SOPN Inject 1 mg into the skin once a week.   Vitamin D , Ergocalciferol , (DRISDOL ) 1.25 MG (50000 UNIT) CAPS capsule Take 1 capsule (50,000 Units total) by mouth every 7 (seven) days.   blood glucose meter kit and supplies KIT Dispense based on patient and insurance preference. Test blood sugar once daily. Diabetes  type 2. E11.9 (Patient not taking: Reported on 05/07/2024)   Lancets (FREESTYLE) lancets Use as instructed (Patient not taking: Reported on 05/07/2024)   No facility-administered encounter medications on file as of 05/07/2024.  :   Review of Systems:  Out of a complete 14 point review of systems, all are reviewed and negative with the exception of these symptoms as listed below:   Review of Systems  Neurological:        Pt here for sleep consult Pt snores,fatigue,hypertension Pt denies headaches. Pt states  turned cpap back in because too much air from mask Hard to tolerate mask . Pt had sleep study Pt had back surgery 09/2023   ESS:13 FSS:9       Objective:  Neurological Exam  Physical Exam Physical Examination:   Vitals:   05/07/24 1519  BP: (!) 141/83  Pulse: 98    General Examination: The patient is a very pleasant 33 y.o. female in no acute distress. She appears well-developed and well-nourished and well groomed.   HEENT: Normocephalic, atraumatic, pupils are equal, round and reactive to light, extraocular tracking is good without limitation to gaze excursion or nystagmus noted. No photophobia.  Corrective eye glasses in place. Hearing is grossly intact.  Face is symmetric with normal facial animation. Speech is clear without dysarthria. There is no hypophonia. There is no lip, neck/head, jaw or voice tremor. Neck is supple with full  range of passive and active motion. There are no carotid bruits on auscultation.  Airway/Oropharynx exam reveals: mild mouth dryness, good dental hygiene and moderate airway crowding, due to small airway entry, slightly elongated uvula and elongated tongue, tonsils about 1+ bilaterally, Mallampati class II, neck circumference 18 inches, minimal overbite noted.  Tongue protrudes centrally and palate elevates symmetrically.  Chest: Clear to auscultation without wheezing, rhonchi or crackles noted.  Heart: S1+S2+0, regular and normal without murmurs, rubs or gallops noted.   Abdomen: Soft, non-tender and non-distended.  Extremities: There is no pitting edema in the distal lower extremities bilaterally.   Skin: Warm and dry without trophic changes noted.   Musculoskeletal: exam reveals no obvious joint deformities.   Neurologically:  Mental status: The patient is awake, alert and oriented in all 4 spheres. Her immediate and remote memory, attention, language skills and fund of knowledge are appropriate. There is no evidence of aphasia, agnosia, apraxia or anomia. Speech is clear with normal prosody and enunciation. Thought process is linear. Mood is normal and affect is normal.  Cranial nerves II - XII are as described above under HEENT exam.  Motor exam: Normal bulk, strength and tone is noted. There is no obvious action or resting tremor.  Fine motor skills and coordination: Intact grossly.  Cerebellar testing: No dysmetria or intention tremor. There is no truncal or gait ataxia.  Sensory exam: intact to light touch in the upper and lower extremities.  Gait, station and balance: She stands easily. No veering to one side is noted. No leaning to one side is noted. Posture is age-appropriate and stance is narrow based. Gait shows normal stride length and normal pace. No problems turning are noted.   Assessment and Plan:  In summary, HINATA DIENER is a very pleasant 33 year old female with an  underlying medical history of hypertension, diabetes, hyperlipidemia, and morbid obesity with a BMI of over 50, who presents for evaluation of her obstructive sleep apnea.  She was diagnosed with moderate obstructive sleep apnea back in August 2022, she is no longer on PAP  therapy and had trouble tolerating treatment at the time.  She is willing to get reevaluated and consider treatment again.  A laboratory attended sleep study is typically considered gold standard for evaluation of sleep disordered breathing.   I had a long chat with the patient and her mother about my findings and the diagnosis of sleep apnea, particularly OSA, its prognosis and treatment options. We talked about medical/conservative treatments, surgical interventions and non-pharmacological approaches for symptom control. I explained, in particular, the risks and ramifications of untreated moderate to severe OSA, especially with respect to developing cardiovascular disease down the road, including congestive heart failure (CHF), difficult to treat hypertension, cardiac arrhythmias (particularly A-fib), neurovascular complications including TIA, stroke and dementia. Even type 2 diabetes has, in part, been linked to untreated OSA. Symptoms of untreated OSA may include (but may not be limited to) daytime sleepiness, nocturia (i.e. frequent nighttime urination), memory problems, mood irritability and suboptimally controlled or worsening mood disorder such as depression and/or anxiety, lack of energy, lack of motivation, physical discomfort, as well as recurrent headaches, especially morning or nocturnal headaches. We talked about the importance of maintaining a healthy lifestyle and striving for healthy weight.   I recommended a sleep study at this time. I outlined the differences between a laboratory attended sleep study which is considered more comprehensive and accurate over the option of a home sleep test (HST); the latter may lead to  underestimation of sleep disordered breathing in some instances and does not help with diagnosing upper airway resistance syndrome and is not accurate enough to diagnose primary central sleep apnea typically. I outlined possible surgical and non-surgical treatment options of OSA, including the use of a positive airway pressure (PAP) device (i.e. CPAP, AutoPAP/APAP or BiPAP in certain circumstances), a custom-made dental device (aka oral appliance, which would require a referral to a specialist dentist or orthodontist typically, and is generally speaking not considered for patients with full dentures or edentulous state), upper airway surgical options, such as traditional UPPP (which is not considered a first-line treatment) or the Inspire device (hypoglossal nerve stimulator, which would involve a referral for consultation with an ENT surgeon, after careful selection, following inclusion criteria - also not first-line treatment). I explained the PAP treatment option to the patient in detail, as this is generally considered first-line treatment.  The patient indicated that she would be willing to try PAP therapy, if the need arises. I explained the importance of being compliant with PAP treatment, not only for insurance purposes but primarily to improve patient's symptoms symptoms, and for the patient's long term health benefit, including to reduce Her cardiovascular risks longer-term.    We will pick up our discussion about the next steps and treatment options after testing.  We will keep her posted as to the test results by phone call and/or MyChart messaging where possible.  We will plan to follow-up in sleep clinic accordingly as well.  I answered all their questions today and the patient and her mom were in agreement.   I encouraged her to call with any interim questions, concerns, problems or updates or email us  through MyChart.  Generally speaking, sleep test authorizations may take up to 2 weeks,  sometimes less, sometimes longer, the patient is encouraged to get in touch with us  if they do not hear back from the sleep lab staff directly within the next 2 weeks.  Thank you very much for allowing me to participate in the care of this nice patient. If I  can be of any further assistance to you please do not hesitate to call me at [phone].  Sincerely,   True Mar, MD, PhD

## 2024-05-09 ENCOUNTER — Ambulatory Visit (INDEPENDENT_AMBULATORY_CARE_PROVIDER_SITE_OTHER): Admitting: Internal Medicine

## 2024-05-09 ENCOUNTER — Encounter (INDEPENDENT_AMBULATORY_CARE_PROVIDER_SITE_OTHER): Payer: Self-pay | Admitting: Internal Medicine

## 2024-05-09 VITALS — BP 146/90 | HR 85 | Temp 98.6°F | Ht 64.0 in | Wt 285.0 lb

## 2024-05-09 DIAGNOSIS — G4733 Obstructive sleep apnea (adult) (pediatric): Secondary | ICD-10-CM | POA: Diagnosis not present

## 2024-05-09 DIAGNOSIS — I10 Essential (primary) hypertension: Secondary | ICD-10-CM | POA: Diagnosis not present

## 2024-05-09 DIAGNOSIS — K5903 Drug induced constipation: Secondary | ICD-10-CM

## 2024-05-09 DIAGNOSIS — Z7985 Long-term (current) use of injectable non-insulin antidiabetic drugs: Secondary | ICD-10-CM

## 2024-05-09 DIAGNOSIS — Z7984 Long term (current) use of oral hypoglycemic drugs: Secondary | ICD-10-CM

## 2024-05-09 DIAGNOSIS — E66813 Obesity, class 3: Secondary | ICD-10-CM

## 2024-05-09 DIAGNOSIS — E1169 Type 2 diabetes mellitus with other specified complication: Secondary | ICD-10-CM

## 2024-05-09 DIAGNOSIS — Z6841 Body Mass Index (BMI) 40.0 and over, adult: Secondary | ICD-10-CM

## 2024-05-09 MED ORDER — NEBIVOLOL HCL 5 MG PO TABS: 5.0000 mg | ORAL_TABLET | Freq: Every day | ORAL | 0 refills | Status: DC

## 2024-05-09 NOTE — Progress Notes (Signed)
 Office: [phone]  /  Fax: [phone]  Weight Summary and Body Composition Analysis (BIA)  Vitals Temp: 98.6 F (37 C) BP: (!) 146/90 Pulse Rate: 85 SpO2: 98 %   Anthropometric Measurements Height: 5' 4 (1.626 m) Weight: 285 lb (129.3 kg) BMI (Calculated): 48.9 Weight at Last Visit: 296 lb Weight Lost Since Last Visit: 11 Weight Gained Since Last Visit: 0 lb Starting Weight: 307 lb Total Weight Loss (lbs): 22 lb (9.979 kg) Peak Weight: 322 lb   Body Composition  Body Fat %: 55.2 % Fat Mass (lbs): 157.8 lbs Muscle Mass (lbs): 121.6 lbs Visceral Fat Rating : 18    RMR: 2056  Today's Visit #: 3  Starting Date: 04/05/23   Subjective   Chief Complaint: Obesity  Interval History   Discussed the use of AI scribe software for clinical note transcription with the patient, who gave verbal consent to proceed.  History of Present Illness Brianna [name] is a 33 year old female with type 2 diabetes, untreated sleep apnea, uncontrolled hypertension, and hyperlipidemia who presents for medical weight management. She is accompanied by her two-year-old son.  She adheres to a 1500 calorie nutrition plan, focusing on whole foods and adequate protein intake. She exercises five days a week for 60 to 90 minutes, incorporating both cardio and strengthening exercises. Since her last visit, she has lost 11 pounds, totaling a weight loss of 22 pounds. She reports no skipped meals and adequate sleep.  Her type 2 diabetes is managed with metformin , two tablets daily, and semaglutide  (Ozempic ) at a dose of one milligram daily. She notes improved energy levels since starting her weight management regimen.  Her untreated sleep apnea was significant with drops in oxygen levels during her sleep study. She notes improvement in sleep quality as her weight loss progresses.  She is on Bystolic , recently started, with no side effects when taken in the morning. Amlodipine  is also part of  her regimen, contributing to constipation issues.  Constipation is attributed to medications including amlodipine , Ozempic , and an iron supplement. She uses Miralax , taking two servings every other day, and experiences diarrhea with more frequent use.    Challenges affecting patient progress: medical comorbidities    Pharmacotherapy for weight management: She is currently taking Metformin  with diabetes as primary indication with adequate clinical response  and without side effects. and Ozempic  with diabetes as the primary indication and obesity secondary with adequate clinical response  and without side effects.  Medications are being written for by primary care team  Assessment and Plan   Treatment Plan For Obesity:  Recommended Dietary Goals  [name] is currently in the action stage of change. As such, her goal is to continue weight management plan. She has agreed to: continue current plan  Behavioral Health and Counseling  We discussed the following behavioral modification strategies today: continue to work on maintaining a reduced calorie state, getting the recommended amount of protein, incorporating whole foods, making healthy choices, staying well hydrated and practicing mindfulness when eating. and increase protein intake, fibrous foods (25 grams per day for women, 30 grams for men) and water to improve satiety and decrease hunger signals. .  Additional education and resources provided today: None  Recommended Physical Activity Goals  [name] has been advised to work up to 150 minutes of moderate intensity aerobic activity a week and strengthening exercises 2-3 times per week for cardiovascular health, weight loss maintenance and preservation of muscle mass.  She has agreed to :  Think about enjoyable ways to increase daily physical activity and overcoming barriers to exercise, Increase physical activity in their day and reduce sedentary time (increase NEAT)., Increase volume of  physical activity to a goal of 240 minutes a week, and Combine aerobic and strengthening exercises for efficiency and improved cardiometabolic health.  Medical Interventions and Pharmacotherapy  We discussed various medication options to help [name] with her weight loss efforts and we both agreed to : Increase semaglutide  to 1 mg once a week medication will be prescribed by primary care team unless the preferred for us  to take over.  She will continue on metformin .  Associated Conditions Impacted by Obesity Treatment  Assessment & Plan Primary hypertension Blood pressure is gradually improving but still not at goal.  She is on enalapril , amlodipine  and I started her recently on Bystolic .  Were increasing Bystolic  to 5 mg daily.  She may benefit from an ARB hydrochlorothiazide combination if no contraindications exist.  I will defer further medication adjustments to primary care team. Type 2 diabetes mellitus with other specified complication, without long-term current use of insulin  [facility]) She has uncontrolled diabetes continues to make significant progress with nutrition and also increasing physical activity.  She is also losing weight in the process.  Her Ozempic  is being adjusted by primary care team I suggest increasing to 2 mg if tolerated.  She will also continue on metformin  for synergy Class 3 severe obesity with serious comorbidity and body mass index (BMI) of 50.0 to 59.9 in adult, unspecified obesity type (HCC) Obesity with associated type 2 diabetes mellitus, uncontrolled hypertension, hyperlipidemia, and untreated obstructive sleep apnea Obesity with significant weight loss of 22 pounds. Type 2 diabetes managed with metformin  and semaglutide . Hypertension remains uncontrolled, but blood pressure has improved. Hyperlipidemia not specifically addressed. Untreated obstructive sleep apnea with significant oxygen desaturation, potentially contributing to hypertension and weight issues.  Continued weight loss may improve sleep apnea. - Continue metformin  and semaglutide . - Increased Bystolic  to 5 mg daily to achieve target blood pressure of 130/80 mmHg. - Encouraged continued weight loss to improve sleep apnea. Obstructive sleep apnea Untreated.  She has severe OSA with oxygen desaturation.  We discussed the bidirectional relationship between weight and sleep apnea.  This will make weight loss very difficult and also have an effect on other cardiometabolic risk.  I have asked her to reconsider trying CPAP treatment again.  She has reach out to responsible entity but has not had a follow-up.  She benefits from GLP-1 aided weight loss. Drug-induced constipation Likely multifactorial, related to amlodipine , semaglutide , and iron supplementation. Current regimen includes Miralax  and Dulcolax, but experiencing diarrhea with current dosing. - Adjust Miralax  to one serving daily to achieve soft stools. - Use Dulcolax as needed for constipation. - Ensure adequate hydration with 90 ounces of water daily. - Provided list of high-fiber foods to increase dietary fiber intake. - Consider glycerin or Dulcolax suppositories if experiencing rectal impaction.           Objective   Physical Exam:  Blood pressure (!) 146/90, pulse 85, temperature 98.6 F (37 C), height 5' 4 (1.626 m), weight 285 lb (129.3 kg), last menstrual period 04/18/2024, SpO2 98%. Body mass index is 48.92 kg/m.  General: She is overweight, cooperative, alert, well developed, and in no acute distress. PSYCH: Has normal mood, affect and thought process.   HEENT: EOMI, sclerae are anicteric. Lungs: Normal breathing effort, no conversational dyspnea. Extremities: No edema.  Neurologic: No gross sensory or motor deficits.  No tremors or fasciculations noted.    Diagnostic Data Reviewed:  BMET    Component Value Date/Time   NA 136 03/08/2024 0911   K 4.5 03/08/2024 0911   CL 102 03/08/2024 0911   CO2 20  03/08/2024 0911   GLUCOSE 223 (H) 03/08/2024 0911   GLUCOSE 124 (H) 10/03/2023 0847   BUN 8 03/08/2024 0911   CREATININE 0.58 03/08/2024 0911   CALCIUM 8.7 03/08/2024 0911   GFRNONAA >60 10/03/2023 0847   GFRAA 153 05/22/2020 1026   Lab Results  Component Value Date   HGBA1C 8.4 (H) 03/08/2024   HGBA1C 11.3 (H) 09/03/2014   Lab Results  Component Value Date   INSULIN  16.1 04/04/2024   Lab Results  Component Value Date   TSH 1.740 08/29/2023   CBC    Component Value Date/Time   WBC 4.7 03/08/2024 0911   WBC 4.8 08/08/2023 0306   RBC 3.97 03/08/2024 0911   RBC 3.93 08/08/2023 0306   HGB 10.7 (L) 03/08/2024 0911   HCT 34.6 03/08/2024 0911   PLT 230 03/08/2024 0911   MCV 87 03/08/2024 0911   MCH 27.0 03/08/2024 0911   MCH 28.0 08/08/2023 0306   MCHC 30.9 (L) 03/08/2024 0911   MCHC 32.4 08/08/2023 0306   RDW 13.9 03/08/2024 0911   Iron Studies No results found for: IRON, TIBC, FERRITIN, IRONPCTSAT Lipid Panel     Component Value Date/Time   CHOL 171 03/08/2024 0911   TRIG 42 03/08/2024 0911   HDL 47 03/08/2024 0911   CHOLHDL 3.6 03/08/2024 0911   CHOLHDL 3.4 09/05/2014 0550   VLDL 15 09/05/2014 0550   LDLCALC 115 (H) 03/08/2024 0911   Hepatic Function Panel     Component Value Date/Time   PROT 7.4 03/08/2024 0911   ALBUMIN  4.1 03/08/2024 0911   AST 9 03/08/2024 0911   ALT 16 03/08/2024 0911   ALKPHOS 69 03/08/2024 0911   BILITOT 0.2 03/08/2024 0911   BILIDIR 0.12 08/29/2023 0935      Component Value Date/Time   TSH 1.740 08/29/2023 0935   Nutritional Lab Results  Component Value Date   VD25OH 16.8 (L) 04/04/2024   VD25OH 78.7 12/06/2019    Medications: Outpatient Encounter Medications as of 05/09/2024  Medication Sig   nebivolol  (BYSTOLIC ) 5 MG tablet Take 1 tablet (5 mg total) by mouth daily.   amLODipine  (NORVASC ) 10 MG tablet TAKE 1 TABLET BY MOUTH EVERY DAY   blood glucose meter kit and supplies KIT Dispense based on patient and  insurance preference. Test blood sugar once daily. Diabetes type 2. E11.9 (Patient not taking: Reported on 05/07/2024)   enalapril  (VASOTEC ) 20 MG tablet TAKE 1 TABLET BY MOUTH EVERY DAY   Lancets (FREESTYLE) lancets Use as instructed (Patient not taking: Reported on 05/07/2024)   levothyroxine  (SYNTHROID ) 75 MCG tablet TAKE 1 TABLET BY MOUTH EVERY DAY   metFORMIN  (GLUCOPHAGE -XR) 500 MG 24 hr tablet TAKE 2 TABLETS BY MOUTH EVERY DAY WITH BREAKFAST   norethindrone  (MICRONOR ) 0.35 MG tablet Take 1 tablet by mouth daily.   Semaglutide , 1 MG/DOSE, (OZEMPIC , 1 MG/DOSE,) 4 MG/3ML SOPN Inject 1 mg into the skin once a week.   Vitamin D , Ergocalciferol , (DRISDOL ) 1.25 MG (50000 UNIT) CAPS capsule Take 1 capsule (50,000 Units total) by mouth every 7 (seven) days.   [DISCONTINUED] nebivolol  (BYSTOLIC ) 2.5 MG tablet Take 1 tablet (2.5 mg total) by mouth daily.   No facility-administered encounter medications on file as of 05/09/2024.     Follow-Up  Return in about 3 weeks (around 05/30/2024) for For Weight Mangement with Dr. Francyne.SABRA She was informed of the importance of frequent follow up visits to maximize her success with intensive lifestyle modifications for her multiple health conditions.  Attestation Statement   Reviewed by clinician on day of visit: allergies, medications, problem list, medical history, surgical history, family history, social history, and previous encounter notes.     Lucas Francyne, MD

## 2024-05-10 DIAGNOSIS — K5903 Drug induced constipation: Secondary | ICD-10-CM | POA: Insufficient documentation

## 2024-05-10 NOTE — Assessment & Plan Note (Signed)
 Blood pressure is gradually improving but still not at goal.  She is on enalapril , amlodipine  and I started her recently on Bystolic .  Were increasing Bystolic  to 5 mg daily.  She may benefit from an ARB hydrochlorothiazide combination if no contraindications exist.  I will defer further medication adjustments to primary care team.

## 2024-05-10 NOTE — Assessment & Plan Note (Signed)
 Untreated.  She has severe OSA with oxygen desaturation.  We discussed the bidirectional relationship between weight and sleep apnea.  This will make weight loss very difficult and also have an effect on other cardiometabolic risk.  I have asked her to reconsider trying CPAP treatment again.  She has reach out to responsible entity but has not had a follow-up.  She benefits from GLP-1 aided weight loss.

## 2024-05-10 NOTE — Assessment & Plan Note (Signed)
 Likely multifactorial, related to amlodipine , semaglutide , and iron supplementation. Current regimen includes Miralax  and Dulcolax, but experiencing diarrhea with current dosing. - Adjust Miralax  to one serving daily to achieve soft stools. - Use Dulcolax as needed for constipation. - Ensure adequate hydration with 90 ounces of water daily. - Provided list of high-fiber foods to increase dietary fiber intake. - Consider glycerin or Dulcolax suppositories if experiencing rectal impaction.

## 2024-05-10 NOTE — Assessment & Plan Note (Signed)
 Obesity with associated type 2 diabetes mellitus, uncontrolled hypertension, hyperlipidemia, and untreated obstructive sleep apnea Obesity with significant weight loss of 22 pounds. Type 2 diabetes managed with metformin  and semaglutide . Hypertension remains uncontrolled, but blood pressure has improved. Hyperlipidemia not specifically addressed. Untreated obstructive sleep apnea with significant oxygen desaturation, potentially contributing to hypertension and weight issues. Continued weight loss may improve sleep apnea. - Continue metformin  and semaglutide . - Increased Bystolic  to 5 mg daily to achieve target blood pressure of 130/80 mmHg. - Encouraged continued weight loss to improve sleep apnea.

## 2024-05-10 NOTE — Assessment & Plan Note (Signed)
 She has uncontrolled diabetes continues to make significant progress with nutrition and also increasing physical activity.  She is also losing weight in the process.  Her Ozempic  is being adjusted by primary care team I suggest increasing to 2 mg if tolerated.  She will also continue on metformin  for synergy

## 2024-05-28 ENCOUNTER — Telehealth: Payer: Self-pay | Admitting: Neurology

## 2024-05-28 NOTE — Telephone Encounter (Signed)
 NPSG [employer] [facility] pending

## 2024-05-31 ENCOUNTER — Ambulatory Visit (INDEPENDENT_AMBULATORY_CARE_PROVIDER_SITE_OTHER): Admitting: Nurse Practitioner

## 2024-05-31 VITALS — BP 158/90 | HR 92 | Temp 97.5°F | Ht 64.0 in | Wt 279.0 lb

## 2024-05-31 DIAGNOSIS — E1169 Type 2 diabetes mellitus with other specified complication: Secondary | ICD-10-CM

## 2024-05-31 DIAGNOSIS — M21371 Foot drop, right foot: Secondary | ICD-10-CM

## 2024-05-31 DIAGNOSIS — I1A0 Resistant hypertension: Secondary | ICD-10-CM | POA: Diagnosis not present

## 2024-05-31 DIAGNOSIS — D509 Iron deficiency anemia, unspecified: Secondary | ICD-10-CM

## 2024-05-31 DIAGNOSIS — Z23 Encounter for immunization: Secondary | ICD-10-CM | POA: Diagnosis not present

## 2024-05-31 DIAGNOSIS — E785 Hyperlipidemia, unspecified: Secondary | ICD-10-CM

## 2024-05-31 DIAGNOSIS — E66813 Obesity, class 3: Secondary | ICD-10-CM

## 2024-05-31 DIAGNOSIS — Z6841 Body Mass Index (BMI) 40.0 and over, adult: Secondary | ICD-10-CM

## 2024-05-31 NOTE — Progress Notes (Signed)
 Subjective:    Patient ID: Brianna [name], female    DOB: 12-Sep-1990, 33 y.o.   MRN: [id number]  CC follow-up visit  HPI 33 year old, female, arrived for her follow up appointment about HTN and medication. She recently had a 3rd blood pressure (Bystolic ) medication added to her regimen by provider at [facility].  She states that she takes her BP at home a normally receives readings in the 120/130 over 80s but does not keep a log. She is using a wrist blood pressure cuff.  In addition, she is suffering from right foot drop as a consequence to her back surgery 09/2023. She is currently utilizing a calf brace and is requesting possible PT.   Diet: tries to adhere to the 1500/day calorie restriction but struggles at times Sleep: wakes up through out the night due to her OSA. Has a pending sleep study through neurology. Physical activity: walking, gym weekly   Review of Systems  Constitutional:  Negative for activity change, appetite change and fever.  HENT:  Negative for sore throat and trouble swallowing.   Respiratory:  Negative for cough, chest tightness, shortness of breath and wheezing.   Cardiovascular:  Negative for chest pain and leg swelling.  Gastrointestinal:  Negative for abdominal pain, constipation, nausea and vomiting.  Genitourinary:  Negative for frequency and urgency.  Neurological:  Negative for syncope.        Objective:   Physical Exam Vitals and nursing note reviewed.  Constitutional:      General: She is not in acute distress.    Appearance: Normal appearance.  Cardiovascular:     Rate and Rhythm: Normal rate and regular rhythm.     Pulses:          Dorsalis pedis pulses are 2+ on the right side and 2+ on the left side.       Posterior tibial pulses are 2+ on the right side and 2+ on the left side.     Heart sounds: Normal heart sounds.  Pulmonary:     Effort: Pulmonary effort is normal.     Breath sounds: Normal breath sounds.   Musculoskeletal:     Right lower leg: No edema.     Left lower leg: No edema.     Right foot: Foot drop present.     Comments: Wearing a brace on the right foot.   Neurological:     Mental Status: She is alert and oriented to person, place, and time.     Comments: No difficulty ambulating or getting on exam table.   Psychiatric:        Mood and Affect: Mood normal.        Behavior: Behavior normal.        Thought Content: Thought content normal.    Diabetic Foot Exam - Simple   Simple Foot Form Diabetic Foot exam was performed with the following findings: Yes 05/31/2024 11:15 AM  Visual Inspection No deformities, no ulcerations, no other skin breakdown bilaterally: Yes Sensation Testing Intact to touch and monofilament testing bilaterally: Yes Pulse Check Posterior Tibialis and Dorsalis pulse intact bilaterally: Yes Comments        Vitals:   05/31/24 1044  BP: (!) 158/90  Pulse: 92  Temp: (!) 97.5 F (36.4 C)  Height: 5' 4 (1.626 m)  Weight: 126.6 kg  SpO2: 99%  BMI (Calculated): 47.87       Assessment & Plan:  1. Immunization due  - Flu vaccine  trivalent PF, 6mos and older(Flulaval,Afluria,Fluarix,Fluzone)  2. Resistant hypertension (Primary) Patient educated on keeping a BP log at home. She was instructed to take her bp daily and at various times and to bring to het next appointment or to send results via MyChart.  Bring home BP cuff to next office visit for assessment.  Encourage to continue physical activity such as strength training and include protein in her diet.  May need referral based on results of labs and home readings.  - Aldosterone + renin activity w/ ratio - CBC with Differential - Comprehensive metabolic panel with GFR  3. Foot drop, right foot - Ambulatory referral to Physical Therapy Continue to wear brace.   4. Type 2 diabetes mellitus with other specified complication, without long-term current use of insulin  (HCC) - HgB A1c -  Comprehensive metabolic panel with GFR  5. Hyperlipidemia associated with type 2 diabetes mellitus (HCC) - Lipid Profile  6. Class 3 severe obesity with serious comorbidity and body mass index (BMI) of 50.0 to 59.9 in adult, unspecified obesity type (HCC) Encourage to continue physical activity such as strength training and include protein in her diet.  Continue to work towards eating a well rounded, healthy diet.  - Continue follow up with [facility] - TSH  7. Iron deficiency anemia, unspecified iron deficiency anemia type - CBC with Differential   Return in about 3 months (around 08/31/2024).

## 2024-06-01 ENCOUNTER — Encounter: Payer: Self-pay | Admitting: Nurse Practitioner

## 2024-06-03 NOTE — Telephone Encounter (Signed)
 [place] [place] req/ NPSG denied see below for the denial.

## 2024-06-04 ENCOUNTER — Ambulatory Visit (INDEPENDENT_AMBULATORY_CARE_PROVIDER_SITE_OTHER): Payer: Self-pay | Admitting: Internal Medicine

## 2024-06-04 ENCOUNTER — Encounter (INDEPENDENT_AMBULATORY_CARE_PROVIDER_SITE_OTHER): Payer: Self-pay | Admitting: Internal Medicine

## 2024-06-04 VITALS — BP 137/89 | HR 93 | Temp 98.1°F | Ht 64.0 in | Wt 272.0 lb

## 2024-06-04 DIAGNOSIS — G4733 Obstructive sleep apnea (adult) (pediatric): Secondary | ICD-10-CM | POA: Diagnosis not present

## 2024-06-04 DIAGNOSIS — I10 Essential (primary) hypertension: Secondary | ICD-10-CM

## 2024-06-04 DIAGNOSIS — Z7985 Long-term (current) use of injectable non-insulin antidiabetic drugs: Secondary | ICD-10-CM

## 2024-06-04 DIAGNOSIS — E1169 Type 2 diabetes mellitus with other specified complication: Secondary | ICD-10-CM | POA: Diagnosis not present

## 2024-06-04 DIAGNOSIS — Z6841 Body Mass Index (BMI) 40.0 and over, adult: Secondary | ICD-10-CM

## 2024-06-04 DIAGNOSIS — E66813 Obesity, class 3: Secondary | ICD-10-CM

## 2024-06-04 NOTE — Progress Notes (Signed)
 Office: [phone]  /  Fax: [phone]  Weight Summary and Body Composition Analysis (BIA)  Vitals Temp: 98.1 F (36.7 C) BP: 137/89 Pulse Rate: 93 SpO2: 100 %   Anthropometric Measurements Height: 5' 4 (1.626 m) Weight: 272 lb (123.4 kg) BMI (Calculated): 46.67 Weight at Last Visit: 285 lb Weight Lost Since Last Visit: 13 lb Weight Gained Since Last Visit: 0 lb Starting Weight: 307 lb Total Weight Loss (lbs): 35 lb (15.9 kg) Peak Weight: 322 lb   Body Composition  Body Fat %: 54.5 % Fat Mass (lbs): 148.6 lbs Muscle Mass (lbs): 117.8 lbs Visceral Fat Rating : 16    RMR: 2056  Today's Visit #: 4  Starting Date: 04/04/24   Subjective   Chief Complaint: Obesity  Interval History Discussed the use of AI scribe software for clinical note transcription with the patient, who gave verbal consent to proceed.  History of Present Illness Brianna [name] is a 33 year old female who presents for medical weight management.  She has lost eighteen pounds since her last visit, primarily due to increased physical activity. She exercises for one and a half to two hours five times a week, often while talking on the phone with her aunt. She estimates eating healthily about fifty percent of the time and remains under her calorie goals.  She is experiencing sleep disturbances and is awaiting a sleep study, which has not yet been scheduled.  She faces challenges in maintaining a healthy diet, particularly when she does not prepare meals to take to work, leading her to eat out more frequently. She spends about ten dollars or less when eating out and is considering healthier frozen meal options.  Her current medications include Bystolic , for which she needs a refill, and Ozempic , currently at a one milligram dose. Her A1c was previously recorded at 8.4, and she is awaiting further evaluation to determine if her Ozempic  dose should be increased.     Challenges affecting  patient progress: medical comorbidities.    Pharmacotherapy for weight management: She is currently taking Metformin  with diabetes as primary indication with adequate clinical response  and without side effects. and Ozempic  with diabetes as the primary indication and obesity secondary with adequate clinical response  and without side effects..   Assessment and Plan   Treatment Plan For Obesity:  Recommended Dietary Goals  Brianna [name] is currently in the action stage of change. As such, her goal is to continue weight management plan. She has agreed to: continue current plan  Behavioral Health and Counseling  We discussed the following behavioral modification strategies today: continue to work on maintaining a reduced calorie state, getting the recommended amount of protein, incorporating whole foods, making healthy choices, staying well hydrated and practicing mindfulness when eating. and increase protein intake, fibrous foods (25 grams per day for women, 30 grams for men) and water to improve satiety and decrease hunger signals. .  Additional education and resources provided today: None  Recommended Physical Activity Goals  Brianna [name] has been advised to work up to 150 minutes of moderate intensity aerobic activity a week and strengthening exercises 2-3 times per week for cardiovascular health, weight loss maintenance and preservation of muscle mass.  She has agreed to :  Think about enjoyable ways to increase daily physical activity and overcoming barriers to exercise, Increase physical activity in their day and reduce sedentary time (increase NEAT)., Increase volume of physical activity to a goal of 240 minutes a week, and Combine aerobic and  strengthening exercises for efficiency and improved cardiometabolic health.  Medical Interventions and Pharmacotherapy  We discussed various medication options to help Brianna [name] with her weight loss efforts and we both agreed to : Adequate clinical response  to anti-obesity medication, continue current regimen and Ozempic  is being adjusted by primary care team recommend increasing medication to 2 mg a day  Associated Conditions Impacted by Obesity Treatment  Assessment & Plan Primary hypertension Blood pressure is gradually improving but still not at goal.  She is on enalapril , amlodipine  and I started her recently on Bystolic .   She may benefit from an ARB hydrochlorothiazide combination if no contraindications exist.  I will defer further medication adjustments to primary care team.  Increasing semaglutide  may also help lower blood pressure.  Medication is being managed by primary care team Type 2 diabetes mellitus with other specified complication, without Brianna-term current use of insulin  (HCC) A1c was 8.4. Currently on Ozempic  1 mg. No nausea, vomiting, or constipation reported. Awaiting further A1c results to determine if dosage adjustment is necessary. - Continue Ozempic  1 mg. - Follow up with primary care provider for A1c results and potential dosage adjustment. - Consider increasing Ozempic  to 2 mg if A1c remains elevated and medication is well-tolerated. Class 3 severe obesity with serious comorbidity and body mass index (BMI) of 50.0 to 59.9 in adult, unspecified obesity type (HCC) Weight: decrease of 34.25 lb (10.9%) over 2 months, 3 weeks  Start: 03/07/2024 313 lb 4 oz (142.1 kg) (H)  End: 05/31/2024 279 lb (126.6 kg)   Engaging in regular physical activity, 1.5 to 2 hours, 5 times a week. Challenges with dietary habits, particularly when not cooking at home. Body fat percentage decreased to 54%, and visceral fat decreased from 19 to 16. Blood pressure improved. Motivated and in the action phase of behavior change. - Continue current exercise regimen. - Encouraged dietary modifications focusing on increased intake of fruits, vegetables, and lean proteins. - Consider healthier frozen meal options such as Brianna [name], [employer], Brianna [name], and  Brianna [name]. - Explore meal prep options from Brianna [name] in [place]. - Consider adding protein to meals using options like Greek yogurt with fruit. - Encouraged mindful eating out by choosing healthier options at restaurants like [employer] or [employer].          Objective   Physical Exam:  Blood pressure 137/89, pulse 93, temperature 98.1 F (36.7 C), height 5' 4 (1.626 m), weight 272 lb (123.4 kg), last menstrual period 05/21/2024, SpO2 100%. Body mass index is 46.69 kg/m.  General: She is overweight, cooperative, alert, well developed, and in no acute distress. PSYCH: Has normal mood, affect and thought process.   HEENT: EOMI, sclerae are anicteric. Lungs: Normal breathing effort, no conversational dyspnea. Extremities: No edema.  Neurologic: No gross sensory or motor deficits. No tremors or fasciculations noted.    Diagnostic Data Reviewed:  BMET    Component Value Date/Time   NA 136 03/08/2024 0911   K 4.5 03/08/2024 0911   CL 102 03/08/2024 0911   CO2 20 03/08/2024 0911   GLUCOSE 223 (H) 03/08/2024 0911   GLUCOSE 124 (H) 10/03/2023 0847   BUN 8 03/08/2024 0911   CREATININE 0.58 03/08/2024 0911   CALCIUM 8.7 03/08/2024 0911   GFRNONAA >60 10/03/2023 0847   GFRAA 153 05/22/2020 1026   Lab Results  Component Value Date   HGBA1C 8.4 (H) 03/08/2024   HGBA1C 11.3 (H) 09/03/2014   Lab Results  Component Value Date   INSULIN  16.1 04/04/2024  Lab Results  Component Value Date   TSH 1.740 08/29/2023   CBC    Component Value Date/Time   WBC 4.7 03/08/2024 0911   WBC 4.8 08/08/2023 0306   RBC 3.97 03/08/2024 0911   RBC 3.93 08/08/2023 0306   HGB 10.7 (L) 03/08/2024 0911   HCT 34.6 03/08/2024 0911   PLT 230 03/08/2024 0911   MCV 87 03/08/2024 0911   MCH 27.0 03/08/2024 0911   MCH 28.0 08/08/2023 0306   MCHC 30.9 (L) 03/08/2024 0911   MCHC 32.4 08/08/2023 0306   RDW 13.9 03/08/2024 0911   Iron Studies No results found for: IRON, TIBC,  FERRITIN, IRONPCTSAT Lipid Panel     Component Value Date/Time   CHOL 171 03/08/2024 0911   TRIG 42 03/08/2024 0911   HDL 47 03/08/2024 0911   CHOLHDL 3.6 03/08/2024 0911   CHOLHDL 3.4 09/05/2014 0550   VLDL 15 09/05/2014 0550   LDLCALC 115 (H) 03/08/2024 0911   Hepatic Function Panel     Component Value Date/Time   PROT 7.4 03/08/2024 0911   ALBUMIN  4.1 03/08/2024 0911   AST 9 03/08/2024 0911   ALT 16 03/08/2024 0911   ALKPHOS 69 03/08/2024 0911   BILITOT 0.2 03/08/2024 0911   BILIDIR 0.12 08/29/2023 0935      Component Value Date/Time   TSH 1.740 08/29/2023 0935   Nutritional Lab Results  Component Value Date   VD25OH 16.8 (L) 04/04/2024   VD25OH 78.7 12/06/2019    Medications: Outpatient Encounter Medications as of 06/04/2024  Medication Sig   amLODipine  (NORVASC ) 10 MG tablet TAKE 1 TABLET BY MOUTH EVERY DAY   blood glucose meter kit and supplies KIT Dispense based on patient and insurance preference. Test blood sugar once daily. Diabetes type 2. E11.9   enalapril  (VASOTEC ) 20 MG tablet TAKE 1 TABLET BY MOUTH EVERY DAY   Lancets (FREESTYLE) lancets Use as instructed   levothyroxine  (SYNTHROID ) 75 MCG tablet TAKE 1 TABLET BY MOUTH EVERY DAY   metFORMIN  (GLUCOPHAGE -XR) 500 MG 24 hr tablet TAKE 2 TABLETS BY MOUTH EVERY DAY WITH BREAKFAST   nebivolol  (BYSTOLIC ) 5 MG tablet Take 1 tablet (5 mg total) by mouth daily.   norethindrone  (MICRONOR ) 0.35 MG tablet Take 1 tablet by mouth daily.   Semaglutide , 1 MG/DOSE, (OZEMPIC , 1 MG/DOSE,) 4 MG/3ML SOPN Inject 1 mg into the skin once a week.   Vitamin D , Ergocalciferol , (DRISDOL ) 1.25 MG (50000 UNIT) CAPS capsule Take 1 capsule (50,000 Units total) by mouth every 7 (seven) days.   No facility-administered encounter medications on file as of 06/04/2024.     Follow-Up   Return in about 4 weeks (around 07/02/2024) for For Weight Mangement with Dr. Francyne.SABRA She was informed of the importance of frequent follow up  visits to maximize her success with intensive lifestyle modifications for her multiple health conditions.  Attestation Statement   Reviewed by clinician on day of visit: allergies, medications, problem list, medical history, surgical history, family history, social history, and previous encounter notes.     Lucas Francyne, MD

## 2024-06-05 NOTE — Assessment & Plan Note (Signed)
 A1c was 8.4. Currently on Ozempic  1 mg. No nausea, vomiting, or constipation reported. Awaiting further A1c results to determine if dosage adjustment is necessary. - Continue Ozempic  1 mg. - Follow up with primary care provider for A1c results and potential dosage adjustment. - Consider increasing Ozempic  to 2 mg if A1c remains elevated and medication is well-tolerated.

## 2024-06-05 NOTE — Assessment & Plan Note (Signed)
 Weight: decrease of 34.25 lb (10.9%) over 2 months, 3 weeks  Start: 03/07/2024 313 lb 4 oz (142.1 kg) (H)  End: 05/31/2024 279 lb (126.6 kg)   Engaging in regular physical activity, 1.5 to 2 hours, 5 times a week. Challenges with dietary habits, particularly when not cooking at home. Body fat percentage decreased to 54%, and visceral fat decreased from 19 to 16. Blood pressure improved. Motivated and in the action phase of behavior change. - Continue current exercise regimen. - Encouraged dietary modifications focusing on increased intake of fruits, vegetables, and lean proteins. - Consider healthier frozen meal options such as [employer], [employer], [employer], and [employer]. - Explore meal prep options from [employer] in [place]. - Consider adding protein to meals using options like Greek yogurt with fruit. - Encouraged mindful eating out by choosing healthier options at restaurants like [employer] or [employer].

## 2024-06-05 NOTE — Assessment & Plan Note (Signed)
 Blood pressure is gradually improving but still not at goal.  She is on enalapril , amlodipine  and I started her recently on Bystolic .   She may benefit from an ARB hydrochlorothiazide combination if no contraindications exist.  I will defer further medication adjustments to primary care team.  Increasing semaglutide  may also help lower blood pressure.  Medication is being managed by primary care team

## 2024-06-05 NOTE — Telephone Encounter (Signed)
 [employer] denied the NPSG- HST pending.  See below for the NPSG denial.

## 2024-06-07 ENCOUNTER — Ambulatory Visit: Admitting: Family Medicine

## 2024-06-10 NOTE — Telephone Encounter (Signed)
 HST [employer] [place] no auth req & [employer] no auth req via fax.

## 2024-06-14 DIAGNOSIS — I1A0 Resistant hypertension: Secondary | ICD-10-CM | POA: Diagnosis not present

## 2024-06-14 DIAGNOSIS — E785 Hyperlipidemia, unspecified: Secondary | ICD-10-CM | POA: Diagnosis not present

## 2024-06-14 DIAGNOSIS — D509 Iron deficiency anemia, unspecified: Secondary | ICD-10-CM | POA: Diagnosis not present

## 2024-06-14 DIAGNOSIS — E1169 Type 2 diabetes mellitus with other specified complication: Secondary | ICD-10-CM | POA: Diagnosis not present

## 2024-06-22 LAB — CBC WITH DIFFERENTIAL/PLATELET
Basophils Absolute: 0 x10E3/uL (ref 0.0–0.2)
Basos: 1 %
EOS (ABSOLUTE): 0.1 x10E3/uL (ref 0.0–0.4)
Eos: 2 %
Hematocrit: 35.9 % (ref 34.0–46.6)
Hemoglobin: 11.4 g/dL (ref 11.1–15.9)
Immature Grans (Abs): 0 x10E3/uL (ref 0.0–0.1)
Immature Granulocytes: 0 %
Lymphocytes Absolute: 1.2 x10E3/uL (ref 0.7–3.1)
Lymphs: 25 %
MCH: 27.9 pg (ref 26.6–33.0)
MCHC: 31.8 g/dL (ref 31.5–35.7)
MCV: 88 fL (ref 79–97)
Monocytes Absolute: 0.4 x10E3/uL (ref 0.1–0.9)
Monocytes: 8 %
Neutrophils Absolute: 3 x10E3/uL (ref 1.4–7.0)
Neutrophils: 64 %
Platelets: 235 x10E3/uL (ref 150–450)
RBC: 4.09 x10E6/uL (ref 3.77–5.28)
RDW: 13.1 % (ref 11.7–15.4)
WBC: 4.7 x10E3/uL (ref 3.4–10.8)

## 2024-06-22 LAB — ALDOSTERONE + RENIN ACTIVITY W/ RATIO
Aldos/Renin Ratio: >34.1 — AB (ref 0.0–30.0)
Aldosterone: 5.7 ng/dL (ref 0.0–30.0)
Renin Activity, Plasma: <0.167 ng/mL/h — AB (ref 0.167–5.380)

## 2024-06-22 LAB — COMPREHENSIVE METABOLIC PANEL WITH GFR
ALT: 12 IU/L (ref 0–32)
AST: 12 IU/L (ref 0–40)
Albumin: 4.3 g/dL (ref 3.9–4.9)
Alkaline Phosphatase: 76 IU/L (ref 41–116)
BUN/Creatinine Ratio: 14 (ref 9–23)
BUN: 9 mg/dL (ref 6–20)
Bilirubin Total: 0.4 mg/dL (ref 0.0–1.2)
CO2: 22 mmol/L (ref 20–29)
Calcium: 9.9 mg/dL (ref 8.7–10.2)
Chloride: 101 mmol/L (ref 96–106)
Creatinine, Ser: 0.64 mg/dL (ref 0.57–1.00)
Globulin, Total: 3.3 g/dL (ref 1.5–4.5)
Glucose: 92 mg/dL (ref 70–99)
Potassium: 4.7 mmol/L (ref 3.5–5.2)
Sodium: 139 mmol/L (ref 134–144)
Total Protein: 7.6 g/dL (ref 6.0–8.5)
eGFR: 120 mL/min/1.73 (ref >59)

## 2024-06-22 LAB — LIPID PANEL
Chol/HDL Ratio: 4 ratio (ref 0.0–4.4)
Cholesterol, Total: 172 mg/dL (ref 100–199)
HDL: 43 mg/dL (ref >39)
LDL Chol Calc (NIH): 120 mg/dL — ABNORMAL HIGH (ref 0–99)
Triglycerides: 44 mg/dL (ref 0–149)
VLDL Cholesterol Cal: 9 mg/dL (ref 5–40)

## 2024-06-22 LAB — TSH: TSH: 2.45 u[IU]/mL (ref 0.450–4.500)

## 2024-06-22 LAB — HEMOGLOBIN A1C
Est. average glucose Bld gHb Est-mCnc: 126 mg/dL
Hgb A1c MFr Bld: 6 % — ABNORMAL HIGH (ref 4.8–5.6)

## 2024-06-26 ENCOUNTER — Ambulatory Visit: Payer: Self-pay | Admitting: Nurse Practitioner

## 2024-06-27 ENCOUNTER — Telehealth: Payer: Self-pay

## 2024-06-27 NOTE — Telephone Encounter (Signed)
 Copied from CRM #[id number]. Topic: Clinical - Lab/Test Results >> Jun 26, 2024  5:02 PM Hadassah PARAS wrote: Reason for CRM: Pt is returning Cassie Gover call to go over results. Please call pt back on # [phone]

## 2024-07-01 ENCOUNTER — Encounter: Payer: Self-pay | Admitting: Nurse Practitioner

## 2024-07-01 NOTE — Progress Notes (Signed)
 Another my chart message sent about BP readings.

## 2024-07-02 ENCOUNTER — Ambulatory Visit (INDEPENDENT_AMBULATORY_CARE_PROVIDER_SITE_OTHER): Admitting: Internal Medicine

## 2024-07-02 VITALS — BP 141/90 | HR 90 | Temp 97.6°F | Ht 64.0 in | Wt 265.0 lb

## 2024-07-02 DIAGNOSIS — I10 Essential (primary) hypertension: Secondary | ICD-10-CM | POA: Diagnosis not present

## 2024-07-02 DIAGNOSIS — E1169 Type 2 diabetes mellitus with other specified complication: Secondary | ICD-10-CM | POA: Diagnosis not present

## 2024-07-02 DIAGNOSIS — E66813 Obesity, class 3: Secondary | ICD-10-CM

## 2024-07-02 DIAGNOSIS — Z6841 Body Mass Index (BMI) 40.0 and over, adult: Secondary | ICD-10-CM | POA: Diagnosis not present

## 2024-07-02 NOTE — Assessment & Plan Note (Signed)
 Weight: decrease of 48.25 lb (15.4%) over 3 months, 3 weeks  Start: 03/07/2024 313 lb 4 oz (142.1 kg) (H)  End: 07/02/2024 265 lb (120.2 kg)  Management is ongoing with a focus on weight loss and dietary modifications. Current weight loss is at a healthy rate with a reduction in body fat percentage from 56% to 53% and visceral fat from 19 to 16. She is on Ozempic , experiencing nausea likely related to dietary choices rather than the medication itself. - Continue current dose of Ozempic  until nausea resolves - Maintain dietary modifications to avoid fatty, greasy, spicy, and sugary foods - Encouraged increased intake of fruits and vegetables - Encouraged hydration and avoidance of high-salt foods

## 2024-07-02 NOTE — Assessment & Plan Note (Signed)
 I reviewed recent labs ordered by primary care team which suggest possibility of hyperaldosteronism, she will likely need confirmatory testing done sitting and fasting.  Has been referred by ordering physicians to nephrology.  She will continue current regimen and follow-up on referral.  Blood pressure is better but still not at goal.  Patient will work on maintaining a low-sodium diet.

## 2024-07-02 NOTE — Assessment & Plan Note (Signed)
 Most recent A1c was 6.0 which has improved from 8.4 due to changes in nutrition and also treatment with GLP-1 and metformin .  She has been experiencing some nausea which I think is due to dietary indiscretion at times and continues to make good steady progress and eating healthier nutrient dense meals.  She is also working on reducing carbohydrates.  Continue Ozempic  at current dose if tolerability is not an issue recommend increasing to 2 mg at the next office visit.

## 2024-07-02 NOTE — Progress Notes (Signed)
 Office: [phone]  /  Fax: [phone]  Weight Summary and Body Composition Analysis (BIA)  Vitals Temp: 97.6 F (36.4 C) BP: (!) 141/90 Pulse Rate: 90 SpO2: 100 %   Anthropometric Measurements Height: 5' 4 (1.626 m) Weight: 265 lb (120.2 kg) BMI (Calculated): 45.46 Weight at Last Visit: 272 lb Weight Lost Since Last Visit: 7 lb Weight Gained Since Last Visit: 0 lb Starting Weight: 307 lb Total Weight Loss (lbs): 42 lb (19.1 kg) Peak Weight: 32 lb   Body Composition  Body Fat %: 53.2 % Fat Mass (lbs): 141.4 lbs Muscle Mass (lbs): 118.2 lbs Total Body Water (lbs): 97.8 lbs Visceral Fat Rating : 16    No data recorded No data recorded No data recorded  Subjective   Chief Complaint: Obesity  Interval History Discussed the use of AI scribe software for clinical note transcription with the patient, who gave verbal consent to proceed.  History of Present Illness Brianna [name] is a 33 year old female with hypertension and obesity who presents for follow-up on weight management and blood pressure control.  She has been experiencing difficulty in controlling her blood pressure, with a recent reading of 141/90 mmHg, which is an improvement from previous measurements. She is currently on Bystolic  5 mg, amlodipine  10 mg, and enalapril  20 mg. She was informed that her recent hormone test was abnormal and is awaiting further communication regarding the results.  For weight management, she is on Ozempic  and experiences nausea, particularly on the two days following her weekly dose. The nausea might be related to her diet, especially after consuming fatty or greasy foods. Despite this, she is losing weight at a healthy rate.  She has a history of anemia and has been taking iron supplements for the past two to three months. Her iron stores have not been recently checked.     Challenges affecting patient progress: none.    Pharmacotherapy for weight management: She  is currently taking Ozempic  with diabetes as the primary indication and obesity secondary with adequate clinical response  and without side effects..   Assessment and Plan   Treatment Plan For Obesity:  Recommended Dietary Goals  Brianna [name] is currently in the action stage of change. As such, her goal is to continue weight management plan. She has agreed to: continue current plan  Behavioral Health and Counseling  We discussed the following behavioral modification strategies today: continue to work on maintaining a reduced calorie state, getting the recommended amount of protein, incorporating whole foods, making healthy choices, staying well hydrated and practicing mindfulness when eating. and increase protein intake, fibrous foods (25 grams per day for women, 30 grams for men) and water to improve satiety and decrease hunger signals. .  Additional education and resources provided today: None  Recommended Physical Activity Goals  Brianna [name] has been advised to work up to 150 minutes of moderate intensity aerobic activity a week and strengthening exercises 2-3 times per week for cardiovascular health, weight loss maintenance and preservation of muscle mass.  She has agreed to :  Think about enjoyable ways to increase daily physical activity and overcoming barriers to exercise, Increase physical activity in their day and reduce sedentary time (increase NEAT)., Increase volume of physical activity to a goal of 240 minutes a week, and Combine aerobic and strengthening exercises for efficiency and improved cardiometabolic health.  Medical Interventions and Pharmacotherapy  We discussed various medication options to help Brianna [name] with her weight loss efforts and we both agreed to :  Continue Ozempic  at current dose as she was experiencing some nausea but likely due to dietary indiscretion.  Counseled on nutritional strategies to avoid side effects with GLP-1.  If after 4 weeks she is doing well recommend  increasing to 2 mg.  Associated Conditions Impacted by Obesity Treatment  Assessment & Plan Primary hypertension I reviewed recent labs ordered by primary care team which suggest possibility of hyperaldosteronism, she will likely need confirmatory testing done sitting and fasting.  Has been referred by ordering physicians to nephrology.  She will continue current regimen and follow-up on referral.  Blood pressure is better but still not at goal.  Patient will work on maintaining a low-sodium diet. Type 2 diabetes mellitus with other specified complication, without long-term current use of insulin  (HCC) Most recent A1c was 6.0 which has improved from 8.4 due to changes in nutrition and also treatment with GLP-1 and metformin .  She has been experiencing some nausea which I think is due to dietary indiscretion at times and continues to make good steady progress and eating healthier nutrient dense meals.  She is also working on reducing carbohydrates.  Continue Ozempic  at current dose if tolerability is not an issue recommend increasing to 2 mg at the next office visit. Class 3 severe obesity with serious comorbidity and body mass index (BMI) of 50.0 to 59.9 in adult, unspecified obesity type (HCC) Weight: decrease of 48.25 lb (15.4%) over 3 months, 3 weeks  Start: 03/07/2024 313 lb 4 oz (142.1 kg) (H)  End: 07/02/2024 265 lb (120.2 kg)  Management is ongoing with a focus on weight loss and dietary modifications. Current weight loss is at a healthy rate with a reduction in body fat percentage from 56% to 53% and visceral fat from 19 to 16. She is on Ozempic , experiencing nausea likely related to dietary choices rather than the medication itself. - Continue current dose of Ozempic  until nausea resolves - Maintain dietary modifications to avoid fatty, greasy, spicy, and sugary foods - Encouraged increased intake of fruits and vegetables - Encouraged hydration and avoidance of high-salt foods          Objective   Physical Exam:  Blood pressure (!) 141/90, pulse 90, temperature 97.6 F (36.4 C), height 5' 4 (1.626 m), weight 265 lb (120.2 kg), last menstrual period 05/21/2024, SpO2 100%. Body mass index is 45.49 kg/m.  General: She is overweight, cooperative, alert, well developed, and in no acute distress. PSYCH: Has normal mood, affect and thought process.   HEENT: EOMI, sclerae are anicteric. Lungs: Normal breathing effort, no conversational dyspnea. Extremities: No edema.  Neurologic: No gross sensory or motor deficits. No tremors or fasciculations noted.    Diagnostic Data Reviewed:  BMET    Component Value Date/Time   NA 139 06/14/2024 1045   K 4.7 06/14/2024 1045   CL 101 06/14/2024 1045   CO2 22 06/14/2024 1045   GLUCOSE 92 06/14/2024 1045   GLUCOSE 124 (H) 10/03/2023 0847   BUN 9 06/14/2024 1045   CREATININE 0.64 06/14/2024 1045   CALCIUM 9.9 06/14/2024 1045   GFRNONAA >60 10/03/2023 0847   GFRAA 153 05/22/2020 1026   Lab Results  Component Value Date   HGBA1C 6.0 (H) 06/14/2024   HGBA1C 11.3 (H) 09/03/2014   Lab Results  Component Value Date   INSULIN  16.1 04/04/2024   Lab Results  Component Value Date   TSH 2.450 06/14/2024   CBC    Component Value Date/Time   WBC 4.7 06/14/2024 1045  WBC 4.8 08/08/2023 0306   RBC 4.09 06/14/2024 1045   RBC 3.93 08/08/2023 0306   HGB 11.4 06/14/2024 1045   HCT 35.9 06/14/2024 1045   PLT 235 06/14/2024 1045   MCV 88 06/14/2024 1045   MCH 27.9 06/14/2024 1045   MCH 28.0 08/08/2023 0306   MCHC 31.8 06/14/2024 1045   MCHC 32.4 08/08/2023 0306   RDW 13.1 06/14/2024 1045   Iron Studies No results found for: IRON, TIBC, FERRITIN, IRONPCTSAT Lipid Panel     Component Value Date/Time   CHOL 172 06/14/2024 1045   TRIG 44 06/14/2024 1045   HDL 43 06/14/2024 1045   CHOLHDL 4.0 06/14/2024 1045   CHOLHDL 3.4 09/05/2014 0550   VLDL 15 09/05/2014 0550   LDLCALC 120 (H) 06/14/2024 1045   Hepatic  Function Panel     Component Value Date/Time   PROT 7.6 06/14/2024 1045   ALBUMIN  4.3 06/14/2024 1045   AST 12 06/14/2024 1045   ALT 12 06/14/2024 1045   ALKPHOS 76 06/14/2024 1045   BILITOT 0.4 06/14/2024 1045   BILIDIR 0.12 08/29/2023 0935      Component Value Date/Time   TSH 2.450 06/14/2024 1045   Nutritional Lab Results  Component Value Date   VD25OH 16.8 (L) 04/04/2024   VD25OH 78.7 12/06/2019    Medications: Outpatient Encounter Medications as of 07/02/2024  Medication Sig   amLODipine  (NORVASC ) 10 MG tablet TAKE 1 TABLET BY MOUTH EVERY DAY   blood glucose meter kit and supplies KIT Dispense based on patient and insurance preference. Test blood sugar once daily. Diabetes type 2. E11.9   enalapril  (VASOTEC ) 20 MG tablet TAKE 1 TABLET BY MOUTH EVERY DAY   Lancets (FREESTYLE) lancets Use as instructed   levothyroxine  (SYNTHROID ) 75 MCG tablet TAKE 1 TABLET BY MOUTH EVERY DAY   metFORMIN  (GLUCOPHAGE -XR) 500 MG 24 hr tablet TAKE 2 TABLETS BY MOUTH EVERY DAY WITH BREAKFAST   nebivolol  (BYSTOLIC ) 5 MG tablet Take 1 tablet (5 mg total) by mouth daily.   norethindrone  (MICRONOR ) 0.35 MG tablet Take 1 tablet by mouth daily.   Semaglutide , 1 MG/DOSE, (OZEMPIC , 1 MG/DOSE,) 4 MG/3ML SOPN Inject 1 mg into the skin once a week.   Vitamin D , Ergocalciferol , (DRISDOL ) 1.25 MG (50000 UNIT) CAPS capsule Take 1 capsule (50,000 Units total) by mouth every 7 (seven) days.   No facility-administered encounter medications on file as of 07/02/2024.     Follow-Up   Return in about 4 weeks (around 07/30/2024) for For Weight Mangement with Dr. Francyne.SABRA She was informed of the importance of frequent follow up visits to maximize her success with intensive lifestyle modifications for her multiple health conditions.  Attestation Statement   Reviewed by clinician on day of visit: allergies, medications, problem list, medical history, surgical history, family history, social history, and previous  encounter notes.     Lucas Francyne, MD

## 2024-07-04 ENCOUNTER — Other Ambulatory Visit: Payer: Self-pay

## 2024-07-08 NOTE — Telephone Encounter (Signed)
 Did speak with the patient she does want to go ahead and have a referral initiated to the kidney specialist due to her BP always being elevated when she goes to weight loss clinic in [place].

## 2024-07-09 ENCOUNTER — Other Ambulatory Visit: Payer: Self-pay | Admitting: Nurse Practitioner

## 2024-07-09 DIAGNOSIS — I10 Essential (primary) hypertension: Secondary | ICD-10-CM

## 2024-07-09 DIAGNOSIS — R7989 Other specified abnormal findings of blood chemistry: Secondary | ICD-10-CM | POA: Insufficient documentation

## 2024-07-09 DIAGNOSIS — I1A0 Resistant hypertension: Secondary | ICD-10-CM

## 2024-07-16 ENCOUNTER — Other Ambulatory Visit: Payer: Self-pay | Admitting: Nurse Practitioner

## 2024-07-23 ENCOUNTER — Other Ambulatory Visit: Payer: Self-pay | Admitting: Family Medicine

## 2024-07-23 DIAGNOSIS — I10 Essential (primary) hypertension: Secondary | ICD-10-CM

## 2024-07-24 ENCOUNTER — Other Ambulatory Visit: Payer: Self-pay | Admitting: Nurse Practitioner

## 2024-07-24 ENCOUNTER — Encounter: Payer: Self-pay | Admitting: Nurse Practitioner

## 2024-07-24 DIAGNOSIS — Z79899 Other long term (current) drug therapy: Secondary | ICD-10-CM

## 2024-07-24 DIAGNOSIS — E1169 Type 2 diabetes mellitus with other specified complication: Secondary | ICD-10-CM

## 2024-07-24 MED ORDER — ROSUVASTATIN CALCIUM 10 MG PO TABS: 10.0000 mg | ORAL_TABLET | Freq: Every day | ORAL | 0 refills | Status: DC

## 2024-07-24 NOTE — Progress Notes (Signed)
"  See mychart message to patient    "

## 2024-08-03 ENCOUNTER — Other Ambulatory Visit: Payer: Self-pay | Admitting: Nurse Practitioner

## 2024-08-03 ENCOUNTER — Ambulatory Visit: Payer: Self-pay | Admitting: Nurse Practitioner

## 2024-08-03 LAB — LIPID PANEL
Chol/HDL Ratio: 3.4 ratio (ref 0.0–4.4)
Cholesterol, Total: 136 mg/dL (ref 100–199)
HDL: 40 mg/dL
LDL Chol Calc (NIH): 86 mg/dL (ref 0–99)
Triglycerides: 42 mg/dL (ref 0–149)
VLDL Cholesterol Cal: 10 mg/dL (ref 5–40)

## 2024-08-03 LAB — HEPATIC FUNCTION PANEL
ALT: 13 IU/L (ref 0–32)
AST: 11 IU/L (ref 0–40)
Albumin: 4.1 g/dL (ref 3.9–4.9)
Alkaline Phosphatase: 72 IU/L (ref 41–116)
Bilirubin Total: 0.5 mg/dL (ref 0.0–1.2)
Bilirubin, Direct: 0.15 mg/dL (ref 0.00–0.40)
Total Protein: 7.2 g/dL (ref 6.0–8.5)

## 2024-08-03 MED ORDER — ROSUVASTATIN CALCIUM 10 MG PO TABS: 10.0000 mg | ORAL_TABLET | Freq: Every day | ORAL | 1 refills | Status: AC

## 2024-08-06 ENCOUNTER — Encounter (INDEPENDENT_AMBULATORY_CARE_PROVIDER_SITE_OTHER): Payer: Self-pay | Admitting: Internal Medicine

## 2024-08-06 ENCOUNTER — Ambulatory Visit (INDEPENDENT_AMBULATORY_CARE_PROVIDER_SITE_OTHER): Admitting: Internal Medicine

## 2024-08-06 VITALS — BP 141/89 | HR 98 | Temp 98.1°F | Ht 64.0 in | Wt 257.0 lb

## 2024-08-06 DIAGNOSIS — Z6841 Body Mass Index (BMI) 40.0 and over, adult: Secondary | ICD-10-CM

## 2024-08-06 DIAGNOSIS — E785 Hyperlipidemia, unspecified: Secondary | ICD-10-CM

## 2024-08-06 DIAGNOSIS — R7989 Other specified abnormal findings of blood chemistry: Secondary | ICD-10-CM | POA: Diagnosis not present

## 2024-08-06 DIAGNOSIS — E66813 Obesity, class 3: Secondary | ICD-10-CM | POA: Diagnosis not present

## 2024-08-06 DIAGNOSIS — E559 Vitamin D deficiency, unspecified: Secondary | ICD-10-CM

## 2024-08-06 DIAGNOSIS — I10 Essential (primary) hypertension: Secondary | ICD-10-CM

## 2024-08-06 DIAGNOSIS — E1169 Type 2 diabetes mellitus with other specified complication: Secondary | ICD-10-CM | POA: Diagnosis not present

## 2024-08-06 DIAGNOSIS — Z7985 Long-term (current) use of injectable non-insulin antidiabetic drugs: Secondary | ICD-10-CM | POA: Diagnosis not present

## 2024-08-06 MED ORDER — VITAMIN D3 50 MCG (2000 UT) PO CAPS: 2000.0000 [IU] | ORAL_CAPSULE | Freq: Every day | ORAL | Status: AC

## 2024-08-06 MED ORDER — NEBIVOLOL HCL 10 MG PO TABS: 10.0000 mg | ORAL_TABLET | Freq: Every day | ORAL | 0 refills | Status: AC

## 2024-08-06 NOTE — Assessment & Plan Note (Signed)
 Improved glycemic control with A1c reduced from 8.4% to 6.0%. Current management includes Ozempic , which is well-tolerated at 1 mg dose. No recent episodes of nausea after dietary adjustments. - Continue Ozempic  at 1 mg dose. - Monitor dietary intake to ensure adequate nutrition.

## 2024-08-06 NOTE — Assessment & Plan Note (Signed)
 She has an appointment with renal in the near future.  We will decrease her Bystolic  to 10 mg once a day.  Continue other blood pressure medications

## 2024-08-06 NOTE — Progress Notes (Signed)
 "  Office: [phone]  /  Fax: [phone]  Weight Summary and Body Composition Analysis (BIA)  Vitals Temp: 98.1 F (36.7 C) BP: (!) 141/89 Pulse Rate: 98   Anthropometric Measurements Height: 5' 4 (1.626 m) Weight: 257 lb (116.6 kg) BMI (Calculated): 44.09 Weight at Last Visit: 265 lb Weight Lost Since Last Visit: 8 lb Weight Gained Since Last Visit: 0 lb Starting Weight: 307 lb Total Weight Loss (lbs): 50 lb (22.7 kg) Peak Weight: 32 lb   Body Composition  Body Fat %: 52.9 % Fat Mass (lbs): 136.2 lbs Muscle Mass (lbs): 115 lbs Visceral Fat Rating : 15    RMR: 2056  Today's Visit #: 6  Starting Date: 04/04/24   Subjective   Chief Complaint: Obesity  Interval History  Discussed the use of AI scribe software for clinical note transcription with the patient, who gave verbal consent to proceed.  History of Present Illness Brianna [name] is a 34 year old female with hypertension and diabetes who presents for weight management and blood pressure control.  She has lost eight pounds over the holidays, attributing it to her current regimen.  She is following a 1200-calorie nutrition plan 75% of the time.  She is exercising 4 days a week for about 30 minutes doing cardio and strengthening.  She is on Ozempic  with no recent dosage changes, experiencing good appetite control, feeling full quicker, and no urges for snacking. She consumes two meals a day, one being a protein shake, and engages in physical activity four days a week for thirty minutes each session. She experienced nausea in the past but has not had any in the last two doses after reducing fatty foods.  Her blood pressure remains slightly elevated, and she is scheduled to see a hypertension and kidney specialist in February. She is currently on Bystolic . She feels much better overall, with less shortness of breath during activities such as walking and climbing stairs. She mentions soreness in her legs  after sitting for more than thirty minutes during car rides.  She has lost a significant amount of weight, approximately fifty-six pounds, which is about eighteen percent of her body weight. She notes a loss of muscle mass, about twenty percent.  Her menstrual cycle has been normal, with a missed cycle in November attributed to stress. Her cholesterol levels have improved, and she has recently started on cholesterol medication due to diabetes. Her A1c has decreased from 8.4 to 6.0.  She has completed two courses of high-dose vitamin D .     Challenges affecting patient progress: none.    Pharmacotherapy for weight management: She is currently taking Ozempic  with diabetes as the primary indication and obesity secondary with adequate clinical response  and without side effects..   Assessment and Plan   Treatment Plan For Obesity:  Recommended Dietary Goals  Brianna [name] is currently in the action stage of change. As such, her goal is to continue weight management plan. She has agreed to: continue current plan  Behavioral Health and Counseling  We discussed the following behavioral modification strategies today: continue to work on maintaining a reduced calorie state, getting the recommended amount of protein, incorporating whole foods, making healthy choices, staying well hydrated and practicing mindfulness when eating. and increase protein intake, fibrous foods (25 grams per day for women, 30 grams for men) and water to improve satiety and decrease hunger signals. .  Additional education and resources provided today: None  Recommended Physical Activity Goals  Brianna [name] has been advised  to work up to 150 minutes of moderate intensity aerobic activity a week and strengthening exercises 2-3 times per week for cardiovascular health, weight loss maintenance and preservation of muscle mass.  She has agreed to :  Think about enjoyable ways to increase daily physical activity and overcoming barriers  to exercise, Increase physical activity in their day and reduce sedentary time (increase NEAT)., Increase volume of physical activity to a goal of 240 minutes a week, and Combine aerobic and strengthening exercises for efficiency and improved cardiometabolic health.  Medical Interventions and Pharmacotherapy  We discussed various medication options to help Brianna [name] with her weight loss efforts and we both agreed to : Adequate clinical response to anti-obesity medication, continue current anti-obesity regimen and do not recommend further increases in GLP-1 due to adequate clinical response   Associated Conditions Impacted by Obesity Treatment  Assessment & Plan Primary hypertension 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 She has an appointment with renal in the near future.  We will decrease her Bystolic  to 10 mg once a day.  Continue other blood pressure medications Vitamin D  deficiency She has completed 2 courses of high-dose vitamin D  she will be transition to vitamin D3 2000 units daily.  Recommend checking vitamin D  in 6 to 12 months to ensure adequate absorption. Type 2 diabetes mellitus with other specified complication, without long-term current use of insulin  (HCC) Improved glycemic control with A1c reduced from 8.4% to 6.0%. Current management includes Ozempic , which is well-tolerated at 1 mg dose. No recent episodes of nausea after dietary adjustments. - Continue Ozempic  at 1 mg dose. - Monitor dietary intake to ensure adequate nutrition. Hyperlipidemia associated with type 2 diabetes mellitus (HCC) Cholesterol levels have improved significantly, with total cholesterol reduced from 120 mg/dL to 86 mg/dL. Current management includes cholesterol medication due to diabetes. - Continue rosuvastatin  10 mg a day Class 3 severe obesity with serious comorbidity and body mass index (BMI) of 40.0 to 44.9 in adult, unspecified obesity type (HCC) Significant weight loss of 56 pounds (18% of body  weight) over a short period. Current weight loss is attributed to dietary changes and increased physical activity. No issues with hair loss or menstrual cycle irregularities. Muscle loss is within expected limits around 20%. - Continue current dietary regimen with emphasis on protein intake of 90-120 grams per day. - Incorporate protein shakes and healthy protein snacks. - Engage in strengthening exercises 2-3 times per week. - Track caloric intake using a weight loss app, aiming for at least 1500 calories per day. - Focus on lifestyle changes including increased fruits, vegetables, and water intake.          Objective   Physical Exam:  Blood pressure (!) 141/89, pulse 98, temperature 98.1 F (36.7 C), height 5' 4 (1.626 m), weight 257 lb (116.6 kg). Body mass index is 44.11 kg/m.  General: She is overweight, cooperative, alert, well developed, and in no acute distress. PSYCH: Has normal mood, affect and thought process.   HEENT: EOMI, sclerae are anicteric. Lungs: Normal breathing effort, no conversational dyspnea. Extremities: No edema.  Neurologic: No gross sensory or motor deficits. No tremors or fasciculations noted.    Diagnostic Data Reviewed:  BMET    Component Value Date/Time   NA 139 06/14/2024 1045   K 4.7 06/14/2024 1045   CL 101 06/14/2024 1045   CO2 22 06/14/2024 1045   GLUCOSE 92 06/14/2024 1045   GLUCOSE 124 (H) 10/03/2023 0847   BUN 9 06/14/2024 1045   CREATININE 0.64  06/14/2024 1045   CALCIUM  9.9 06/14/2024 1045   GFRNONAA >60 10/03/2023 0847   GFRAA 153 05/22/2020 1026   Lab Results  Component Value Date   HGBA1C 6.0 (H) 06/14/2024   HGBA1C 11.3 (H) 09/03/2014   Lab Results  Component Value Date   INSULIN  16.1 04/04/2024   Lab Results  Component Value Date   TSH 2.450 06/14/2024   CBC    Component Value Date/Time   WBC 4.7 06/14/2024 1045   WBC 4.8 08/08/2023 0306   RBC 4.09 06/14/2024 1045   RBC 3.93 08/08/2023 0306   HGB 11.4  06/14/2024 1045   HCT 35.9 06/14/2024 1045   PLT 235 06/14/2024 1045   MCV 88 06/14/2024 1045   MCH 27.9 06/14/2024 1045   MCH 28.0 08/08/2023 0306   MCHC 31.8 06/14/2024 1045   MCHC 32.4 08/08/2023 0306   RDW 13.1 06/14/2024 1045   Iron Studies No results found for: IRON, TIBC, FERRITIN, IRONPCTSAT Lipid Panel     Component Value Date/Time   CHOL 136 08/02/2024 1413   TRIG 42 08/02/2024 1413   HDL 40 08/02/2024 1413   CHOLHDL 3.4 08/02/2024 1413   CHOLHDL 3.4 09/05/2014 0550   VLDL 15 09/05/2014 0550   LDLCALC 86 08/02/2024 1413   Hepatic Function Panel     Component Value Date/Time   PROT 7.2 08/02/2024 1413   ALBUMIN  4.1 08/02/2024 1413   AST 11 08/02/2024 1413   ALT 13 08/02/2024 1413   ALKPHOS 72 08/02/2024 1413   BILITOT 0.5 08/02/2024 1413   BILIDIR 0.15 08/02/2024 1413      Component Value Date/Time   TSH 2.450 06/14/2024 1045   Nutritional Lab Results  Component Value Date   VD25OH 16.8 (L) 04/04/2024   VD25OH 78.7 12/06/2019    Medications: Outpatient Encounter Medications as of 08/06/2024  Medication Sig   amLODipine  (NORVASC ) 10 MG tablet TAKE 1 TABLET BY MOUTH EVERY DAY   blood glucose meter kit and supplies KIT Dispense based on patient and insurance preference. Test blood sugar once daily. Diabetes type 2. E11.9   Cholecalciferol (VITAMIN D3) 50 MCG (2000 UT) capsule Take 1 capsule (2,000 Units total) by mouth daily.   enalapril  (VASOTEC ) 20 MG tablet TAKE 1 TABLET BY MOUTH EVERY DAY   Lancets (FREESTYLE) lancets Use as instructed   levothyroxine  (SYNTHROID ) 75 MCG tablet TAKE 1 TABLET BY MOUTH EVERY DAY   metFORMIN  (GLUCOPHAGE -XR) 500 MG 24 hr tablet TAKE 2 TABLETS BY MOUTH EVERY DAY WITH BREAKFAST   nebivolol  (BYSTOLIC ) 10 MG tablet Take 1 tablet (10 mg total) by mouth daily.   norethindrone  (MICRONOR ) 0.35 MG tablet Take 1 tablet by mouth daily.   rosuvastatin  (CRESTOR ) 10 MG tablet Take 1 tablet (10 mg total) by mouth daily.    Semaglutide , 1 MG/DOSE, (OZEMPIC , 1 MG/DOSE,) 4 MG/3ML SOPN INJECT 1MG  INTO THE SKIN ONCE A WEEK   [DISCONTINUED] nebivolol  (BYSTOLIC ) 5 MG tablet Take 1 tablet (5 mg total) by mouth daily.   [DISCONTINUED] Vitamin D , Ergocalciferol , (DRISDOL ) 1.25 MG (50000 UNIT) CAPS capsule Take 1 capsule (50,000 Units total) by mouth every 7 (seven) days.   No facility-administered encounter medications on file as of 08/06/2024.     Follow-Up   Return in about 4 weeks (around 09/03/2024) for For Weight Mangement with Dr. Francyne.Brianna [name] She was informed of the importance of frequent follow up visits to maximize her success with intensive lifestyle modifications for her multiple health conditions.  Attestation Statement   Reviewed by  clinician on day of visit: allergies, medications, problem list, medical history, surgical history, family history, social history, and previous encounter notes.     Lucas Parker, MD  "

## 2024-08-06 NOTE — Assessment & Plan Note (Signed)
 Cholesterol levels have improved significantly, with total cholesterol reduced from 120 mg/dL to 86 mg/dL. Current management includes cholesterol medication due to diabetes. - Continue rosuvastatin  10 mg a day

## 2024-08-06 NOTE — Assessment & Plan Note (Signed)
 Significant weight loss of 56 pounds (18% of body weight) over a short period. Current weight loss is attributed to dietary changes and increased physical activity. No issues with hair loss or menstrual cycle irregularities. Muscle loss is within expected limits around 20%. - Continue current dietary regimen with emphasis on protein intake of 90-120 grams per day. - Incorporate protein shakes and healthy protein snacks. - Engage in strengthening exercises 2-3 times per week. - Track caloric intake using a weight loss app, aiming for at least 1500 calories per day. - Focus on lifestyle changes including increased fruits, vegetables, and water intake.

## 2024-08-12 NOTE — Therapy (Signed)
 " OUTPATIENT PHYSICAL THERAPY LOWER EXTREMITY EVALUATION   Patient Name: Brianna [name] MRN: [id number] DOB:1990/11/24, 34 y.o., female Today's Date: 08/13/2024  END OF SESSION:  PT End of Session - 08/13/24 1423     Visit Number 1    Number of Visits 8    Date for Recertification  09/13/24    Authorization Type [employer] PPO    PT Start Time 1335    PT Stop Time 1415    PT Time Calculation (min) 40 min    Activity Tolerance Patient tolerated treatment well    Behavior During Therapy WFL for tasks assessed/performed          Past Medical History:  Diagnosis Date   Anemia    Iron Deficiency Anemia   Back pain    Diabetes mellitus without complication (HCC)    Hypertension    Hypothyroidism    Obesity    Pain    back pain, dx. herniated nucleus polpusos   Sleep apnea    Unable to tolerate CPAP   Thyroid  disease    Past Surgical History:  Procedure Laterality Date   CESAREAN SECTION  2023   LUMBAR LAMINECTOMY/DECOMPRESSION MICRODISCECTOMY N/A 09/03/2014   Procedure: MICROLUMBAR DECOMPRESSION LUMBAR FOUR TO FIVE, LUMBAR FIVE TO SACRAL ONE;  Surgeon: Reyes JAYSON Billing, MD;  Location: [facility];  Service: Orthopedics;  Laterality: N/A;   TONSILLECTOMY     with Adenoids   TRANSFORAMINAL LUMBAR INTERBODY FUSION (TLIF) WITH PEDICLE SCREW FIXATION 1 LEVEL N/A 10/09/2023   Procedure: TRANSFORAMINAL LUMBAR INTERBODY FUSION (TLIF) WITH PEDICLE SCREW FIXATION 1 LEVEL L4-5;  Surgeon: Burnetta Aures, MD;  Location: [facility];  Service: Orthopedics;  Laterality: N/A;  6 hrs 3 C-Bed   WISDOM TOOTH EXTRACTION     Patient Active Problem List   Diagnosis Date Noted   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 07/09/2024   Drug-induced constipation 05/10/2024   Iron deficiency anemia 04/04/2024   Abdominal wall bulge 01/03/2024   Lumbago with sciatica 11/17/2023   Postoperative pain 10/31/2023   S/P lumbar fusion 10/09/2023   Pain of lumbar spine 08/30/2023   Low back pain 05/08/2023    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/08/2023   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   Postpartum care following cesarean delivery 03/01/2022   Chronic hypertension with superimposed pre-eclampsia 02/19/2022   S/P C-section 02/19/2022   Obstructive sleep apnea 04/02/2021   Hypothyroidism 12/09/2019   Hyperlipidemia associated with type 2 diabetes mellitus (HCC) 04/19/2017   HNP (herniated nucleus pulposus), lumbar 09/03/2014   Obesity, unspecified 09/03/2014   Type 2 diabetes mellitus (HCC) 09/03/2014   HTN (hypertension) 09/03/2014    PCP: Bluford Jacqulyn MATSU, DO  REFERRING PROVIDER: Mauro Elveria JAYSON, NP  REFERRING DIAG:  Diagnosis  M21.371 (ICD-10-CM) - Foot drop, right foot    THERAPY DIAG:  Foot drop, right - Plan: PT plan of care cert/re-cert  Difficulty in walking, not elsewhere classified - Plan: PT plan of care cert/re-cert  Ankle weakness - Plan: PT plan of care cert/re-cert  Rationale for Evaluation and Treatment: Rehabilitation  ONSET DATE: 10/19/2023 s/p fusion lumbar  SUBJEDCTIVE:   SUBJECTIVE STATEMENT: S/p lumbar fusion per Dr. Burnetta; had foot drop prior to the surgery but it is still lingering.  Wearing right AFO; states she only wears outside of the home.  Wants to get her active motion back  PERTINENT HISTORY: DM, HTN PAIN:  Are you having pain? Yes: NPRS scale: 0-7/10 Pain location: low back Pain description: sore and tight Aggravating factors:  standing on cement floors at work Relieving factors: take a pain pill; lay down  PRECAUTIONS: Back   WEIGHT BEARING RESTRICTIONS: No  FALLS:  Has patient fallen in last 6 months? No   OCCUPATION: international aid/development worker at [employer]  PLOF: Independent  PATIENT GOALS: not have to wear the AFO  NEXT MD VISIT: 09/06/24  OBJECTIVE:  Note: Objective measures were completed at Evaluation unless otherwise noted.  DIAGNOSTIC FINDINGS:   PATIENT SURVEYS:  LEFS  Extreme difficulty/unable (0), Quite a bit  of difficulty (1), Moderate difficulty (2), Little difficulty (3), No difficulty (4) Survey date:    Any of your usual work, housework or school activities   2. Usual hobbies, recreational or sporting activities   3. Getting into/out of the bath   4. Walking between rooms   5. Putting on socks/shoes   6. Squatting    7. Lifting an object, like a bag of groceries from the floor   8. Performing light activities around your home   9. Performing heavy activities around your home   10. Getting into/out of a car   11. Walking 2 blocks   12. Walking 1 mile   13. Going up/down 10 stairs (1 flight)   14. Standing for 1 hour   15.  sitting for 1 hour   16. Running on even ground   17. Running on uneven ground   18. Making sharp turns while running fast   19. Hopping    20. Rolling over in bed   Score total:  58/80; 72.5%     COGNITION: Overall cognitive status: Within functional limits for tasks assessed     SENSATION: WFL  EDEMA:  None noted  MUSCLE LENGTH: POSTURE: No Significant postural limitations  PALPATION: No significant soreness reported  LOWER EXTREMITY ROM:  Active ROM Right eval Left eval  Hip flexion    Hip extension    Hip abduction    Hip adduction    Hip internal rotation    Hip external rotation    Knee flexion    Knee extension    Ankle dorsiflexion    Ankle plantarflexion    Ankle inversion 18 40  Ankle eversion    Big toe extension 45 68   (Blank rows = not tested)  LOWER EXTREMITY MMT:  MMT Right eval Left eval  Hip flexion 4+ 5  Hip extension    Hip abduction    Hip adduction    Hip internal rotation    Hip external rotation    Knee flexion    Knee extension 5 5  Ankle dorsiflexion 3- 5  Ankle plantarflexion    Ankle inversion 3+ 5  Ankle eversion    Big toe extension     (Blank rows = not tested)    FUNCTIONAL TESTS:  5 times sit to stand: 16.00 sec no UE assist Timed up and go (TUG): 8.90 sec no AD SLS next  visit  GAIT: Distance walked: 50 ft Assistive device utilized: None Level of assistance: Complete Independence Comments: no significant deviation noted; right AFO  TREATMENT DATE: 08/13/2024 physical therapy evaluation and HEP instruction    PATIENT EDUCATION:  Education details: Patient educated on exam findings, POC, scope of PT, HEP, and what to expect next visit. Person educated: Patient Education method: Explanation, Demonstration, and Handouts Education comprehension: verbalized understanding, returned demonstration, verbal cues required, and tactile cues required HOME EXERCISE PROGRAM: Access Code: [email or web address] URL: [email or web address] Date: 08/13/2024 Prepared by: [facility] - Rehab  Exercises - Seated Self Great Toe Stretch  - 2 x daily - 7 x weekly - 3 sets - 10 reps - Seated Ankle Dorsiflexion Stretch  - 2 x daily - 7 x weekly - 1 sets - 10 reps - Long Sitting Ankle Inversion with Resistance  - 1 x daily - 7 x weekly - 2 sets - 10 reps - Seated Ankle Inversion Eversion PROM  - 1 x daily - 7 x weekly - 1 sets - 10 reps - Long Sitting Calf Stretch with Strap  - 1 x daily - 7 x weekly - 1 sets - 10 reps  ASSESSMENT:  CLINICAL IMPRESSION: Patient is a 34 y.o. female who was seen today for physical therapy evaluation and treatment for Foot drop, right foot.   Patient demonstrates muscle weakness, reduced ROM, and fascial restrictions which are likely contributing to symptoms of pain and are negatively impacting patient ability to perform ADLs and functional mobility tasks. Patient will benefit from skilled physical therapy services to address these deficits to reduce pain and improve level of function with ADLs and functional mobility tasks.    OBJECTIVE IMPAIRMENTS: Abnormal gait, decreased activity tolerance, and impaired perceived functional  ability.   ACTIVITY LIMITATIONS: standing, squatting, and stairs  PARTICIPATION LIMITATIONS: meal prep, cleaning, shopping, community activity, and occupation  REHAB POTENTIAL: Good  CLINICAL DECISION MAKING: Evolving/moderate complexity  EVALUATION COMPLEXITY: Moderate   GOALS: Goals reviewed with patient? No  SHORT TERM GOALS: Target date: 08/27/2024 patient will be independent with initial HEP and compliant with HEP 3-4 times a week   Baseline: Goal status: INITIAL  2.  Patient will report 30% improvement overall  Baseline:  Goal status: INITIAL   LONG TERM GOALS: Target date: 09/13/2024  Patient will be independent in self management strategies to improve quality of life and functional outcomes.  Baseline:  Goal status: INITIAL  2.  Patient will report 50% improvement overall  Baseline:  Goal status: INITIAL  3.  Patient will increase right toe extension by 10 degrees and right ankle inversion by 10 degrees a to improve ability to actively clear her foot with swing phase of gait and navigate uneven surfaces without AFO Baseline: 18 degrees Goal status: INITIAL  4.  Patient will increase right toe extension and right ankle eversion to 3+/5 to improve ability to navigate uneven surfaces safely without AFO Baseline: see above Goal status: INITIAL  5.  Patient will decrease TUG score to 7 sec or less to demonstrate decreased fall risk Baseline: 8.9 sec Goal status: INITIAL   PLAN:  PT FREQUENCY: 2x/week  PT DURATION: 4 weeks  PLANNED INTERVENTIONS: 97164- PT Re-evaluation, 97110-Therapeutic exercises, 97530- Therapeutic activity, 97112- Neuromuscular re-education, 97535- Self Care, [id number]- Manual therapy, [id number]- Gait training, [phone]- Orthotic Fit/training, [phone]- Canalith repositioning, [id number]- Aquatic Therapy, 97760- Splinting, [phone]- Wound care (first 20 sq cm), 97598- Wound care (each additional 20 sq cm)Patient/Family education, Balance training, Stair  training, Taping, Dry Needling, Joint mobilization, Joint manipulation, Spinal manipulation, Spinal mobilization, Scar mobilization, and DME instructions.   PLAN FOR NEXT SESSION: Review HEP and  goals; try e-stim; NMES for right extensor hallucis longus; ankle dorsiflexors and invertors; right ankle mobility and strengthening; check SLS next visit  2:36 PM, 09-05-24 Dalia Jollie Small Christella App MPT [facility] [place] [phone] [phone]   Managed Medicaid Authorization Request Treatment Start Date: 09/05/2024  Visit Dx Codes: M21.371, R26.2, R29.898  Functional Tool Score: LEFS 58/80; 72.5%  For all possible CPT codes, reference the Planned Interventions line above.     Check all conditions that are expected to impact treatment: {Conditions expected to impact treatment:Diabetes mellitus   If treatment provided at initial evaluation, no treatment charged due to lack of authorization.      "

## 2024-08-13 ENCOUNTER — Ambulatory Visit (HOSPITAL_COMMUNITY): Attending: Nurse Practitioner

## 2024-08-13 ENCOUNTER — Other Ambulatory Visit: Payer: Self-pay

## 2024-08-13 DIAGNOSIS — M21371 Foot drop, right foot: Secondary | ICD-10-CM | POA: Insufficient documentation

## 2024-08-13 DIAGNOSIS — R262 Difficulty in walking, not elsewhere classified: Secondary | ICD-10-CM | POA: Diagnosis present

## 2024-08-13 DIAGNOSIS — R29898 Other symptoms and signs involving the musculoskeletal system: Secondary | ICD-10-CM | POA: Insufficient documentation

## 2024-08-15 ENCOUNTER — Ambulatory Visit (HOSPITAL_COMMUNITY)

## 2024-08-15 ENCOUNTER — Encounter (HOSPITAL_COMMUNITY): Payer: Self-pay

## 2024-08-15 DIAGNOSIS — M21371 Foot drop, right foot: Secondary | ICD-10-CM | POA: Diagnosis not present

## 2024-08-15 DIAGNOSIS — R262 Difficulty in walking, not elsewhere classified: Secondary | ICD-10-CM

## 2024-08-15 DIAGNOSIS — R29898 Other symptoms and signs involving the musculoskeletal system: Secondary | ICD-10-CM

## 2024-08-15 NOTE — Therapy (Signed)
 " OUTPATIENT PHYSICAL THERAPY LOWER EXTREMITY EVALUATION   Patient Name: Brianna [name] MRN: [id number] DOB:12-22-1990, 34 y.o., female Today's Date: 08/15/2024  END OF SESSION:  PT End of Session - 08/15/24 1336     Visit Number 2    Number of Visits 8    Date for Recertification  09/13/24    Authorization Type BCBS PPO    Progress Note Due on Visit 8    PT Start Time 1337    PT Stop Time 1413    PT Time Calculation (min) 36 min    Activity Tolerance Patient tolerated treatment well    Behavior During Therapy WFL for tasks assessed/performed           Past Medical History:  Diagnosis Date   Anemia    Iron Deficiency Anemia   Back pain    Diabetes mellitus without complication (HCC)    Hypertension    Hypothyroidism    Obesity    Pain    back pain, dx. herniated nucleus polpusos   Sleep apnea    Unable to tolerate CPAP   Thyroid  disease    Past Surgical History:  Procedure Laterality Date   CESAREAN SECTION  2023   LUMBAR LAMINECTOMY/DECOMPRESSION MICRODISCECTOMY N/A 09/03/2014   Procedure: MICROLUMBAR DECOMPRESSION LUMBAR FOUR TO FIVE, LUMBAR FIVE TO SACRAL ONE;  Surgeon: Reyes JAYSON Billing, MD;  Location: [facility];  Service: Orthopedics;  Laterality: N/A;   TONSILLECTOMY     with Adenoids   TRANSFORAMINAL LUMBAR INTERBODY FUSION (TLIF) WITH PEDICLE SCREW FIXATION 1 LEVEL N/A 10/09/2023   Procedure: TRANSFORAMINAL LUMBAR INTERBODY FUSION (TLIF) WITH PEDICLE SCREW FIXATION 1 LEVEL L4-5;  Surgeon: Burnetta Aures, MD;  Location: [facility];  Service: Orthopedics;  Laterality: N/A;  6 hrs 3 C-Bed   WISDOM TOOTH EXTRACTION     Patient Active Problem List   Diagnosis Date Noted   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 07/09/2024   Drug-induced constipation 05/10/2024   Iron deficiency anemia 04/04/2024   Abdominal wall bulge 01/03/2024   Lumbago with sciatica 11/17/2023   Postoperative pain 10/31/2023   S/P lumbar fusion 10/09/2023   Pain of lumbar spine 08/30/2023   Low  back pain 05/08/2023   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/08/2023   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   Postpartum care following cesarean delivery 03/01/2022   Chronic hypertension with superimposed pre-eclampsia 02/19/2022   S/P C-section 02/19/2022   Obstructive sleep apnea 04/02/2021   Hypothyroidism 12/09/2019   Hyperlipidemia associated with type 2 diabetes mellitus (HCC) 04/19/2017   HNP (herniated nucleus pulposus), lumbar 09/03/2014   Obesity, unspecified 09/03/2014   Type 2 diabetes mellitus (HCC) 09/03/2014   HTN (hypertension) 09/03/2014    PCP: Bluford Jacqulyn MATSU, DO  REFERRING PROVIDER: Mauro Elveria JAYSON, NP  REFERRING DIAG:  Diagnosis  M21.371 (ICD-10-CM) - Foot drop, right foot    THERAPY DIAG:  Foot drop, right  Difficulty in walking, not elsewhere classified  Ankle weakness  Rationale for Evaluation and Treatment: Rehabilitation  ONSET DATE: 10/19/2023 s/p fusion lumbar  SUBJEDCTIVE:   SUBJECTIVE STATEMENT: Pt states she was not too sore since last session. Pt states she has noticed a improvement with ankle dorsiflexion but big toe is still not cooperating.  Eval: S/p lumbar fusion per Dr. Burnetta; had foot drop prior to the surgery but it is still lingering.  Wearing right AFO; states she only wears outside of the home.  Wants to get her active motion back  PERTINENT HISTORY: DM, HTN PAIN:  Are  you having pain? Yes: NPRS scale: 0-7/10 Pain location: low back Pain description: sore and tight Aggravating factors: standing on cement floors at work Relieving factors: take a pain pill; lay down  PRECAUTIONS: Back   WEIGHT BEARING RESTRICTIONS: No  FALLS:  Has patient fallen in last 6 months? No   OCCUPATION: international aid/development worker at [employer]  PLOF: Independent  PATIENT GOALS: not have to wear the AFO  NEXT MD VISIT: 09/06/24  OBJECTIVE:  Note: Objective measures were completed at Evaluation unless otherwise noted.  DIAGNOSTIC  FINDINGS:   PATIENT SURVEYS:  LEFS  Extreme difficulty/unable (0), Quite a bit of difficulty (1), Moderate difficulty (2), Little difficulty (3), No difficulty (4) Survey date:    Any of your usual work, housework or school activities   2. Usual hobbies, recreational or sporting activities   3. Getting into/out of the bath   4. Walking between rooms   5. Putting on socks/shoes   6. Squatting    7. Lifting an object, like a bag of groceries from the floor   8. Performing light activities around your home   9. Performing heavy activities around your home   10. Getting into/out of a car   11. Walking 2 blocks   12. Walking 1 mile   13. Going up/down 10 stairs (1 flight)   14. Standing for 1 hour   15.  sitting for 1 hour   16. Running on even ground   17. Running on uneven ground   18. Making sharp turns while running fast   19. Hopping    20. Rolling over in bed   Score total:  58/80; 72.5%     COGNITION: Overall cognitive status: Within functional limits for tasks assessed     SENSATION: WFL  EDEMA:  None noted  MUSCLE LENGTH: POSTURE: No Significant postural limitations  PALPATION: No significant soreness reported  LOWER EXTREMITY ROM:  Active ROM Right eval Left eval  Hip flexion    Hip extension    Hip abduction    Hip adduction    Hip internal rotation    Hip external rotation    Knee flexion    Knee extension    Ankle dorsiflexion    Ankle plantarflexion    Ankle inversion 18 40  Ankle eversion    Big toe extension 45 68   (Blank rows = not tested)  LOWER EXTREMITY MMT:  MMT Right eval Left eval  Hip flexion 4+ 5  Hip extension    Hip abduction    Hip adduction    Hip internal rotation    Hip external rotation    Knee flexion    Knee extension 5 5  Ankle dorsiflexion 3- 5  Ankle plantarflexion    Ankle inversion 3+ 5  Ankle eversion    Big toe extension     (Blank rows = not tested)    FUNCTIONAL TESTS:  5 times sit to stand:  16.00 sec no UE assist Timed up and go (TUG): 8.90 sec no AD SLS   SLS 08/15/2024: R: 4.35 seconds L: 30 seconds  GAIT: Distance walked: 50 ft Assistive device utilized: None Level of assistance: Complete Independence Comments: no significant deviation noted; right AFO  TREATMENT DATE:  08/15/2024  Therapeutic Exercise: -Stationary bike full, 5 minutes, seat 12, pt cued for at least 50 SPM -Heel raises, 2 sets of 10 reps, pt cued for max ROM - SLS 1 bout of 30 seconds bilaterally -Standing calf stretch on incline, 1 set of 2 reps, 30 second holds, UE support on parallel bars, pt cued for alignment of shoulders, hips, knees, and ankles Neuromuscular Reeducation:  -Seated ankle pumps with Russian Estim, 12 minutes, pt cued for increased ROM. Set at 80mA of intensity, 4 seconds on, 12 seconds off, 12 minutes    08/13/2024 physical therapy evaluation and HEP instruction    PATIENT EDUCATION:  Education details: Patient educated on exam findings, POC, scope of PT, HEP, and what to expect next visit. Person educated: Patient Education method: Explanation, Demonstration, and Handouts Education comprehension: verbalized understanding, returned demonstration, verbal cues required, and tactile cues required HOME EXERCISE PROGRAM: Access Code: [email or web address] URL: [email or web address] Date: 08/13/2024 Prepared by: [facility] - Rehab  Exercises - Seated Self Great Toe Stretch  - 2 x daily - 7 x weekly - 3 sets - 10 reps - Seated Ankle Dorsiflexion Stretch  - 2 x daily - 7 x weekly - 1 sets - 10 reps - Long Sitting Ankle Inversion with Resistance  - 1 x daily - 7 x weekly - 2 sets - 10 reps - Seated Ankle Inversion Eversion PROM  - 1 x daily - 7 x weekly - 1 sets - 10 reps - Long Sitting Calf Stretch with Strap  - 1 x daily - 7 x weekly - 1 sets - 10  reps  ASSESSMENT:  CLINICAL IMPRESSION: Patient continues to demonstrate decreased RLE dorsiflexion and great toe extension strength, decreased gait quality and balance. Patient also demonstrates fair endurance with aerobic based exercise during today's session with stationary bike. Patient able to progress dynamic balance and R dorsiflexion activation exercises today with SLS and ankle pump variations, good performance with verbal cueing. Patient educated on importance of consistent HEP compliance and Estim modality and to expect some soreness. Patient would continue to benefit from skilled physical therapy for decreased RLE foot drop, increased endurance with ambulation, increased RLE strength/ROM, and improved balance for improved quality of life, improved independence with community ambulation and continued progress towards therapy goals.  Eval: Patient is a 34 y.o. female who was seen today for physical therapy evaluation and treatment for Foot drop, right foot.   Patient demonstrates muscle weakness, reduced ROM, and fascial restrictions which are likely contributing to symptoms of pain and are negatively impacting patient ability to perform ADLs and functional mobility tasks. Patient will benefit from skilled physical therapy services to address these deficits to reduce pain and improve level of function with ADLs and functional mobility tasks.    OBJECTIVE IMPAIRMENTS: Abnormal gait, decreased activity tolerance, and impaired perceived functional ability.   ACTIVITY LIMITATIONS: standing, squatting, and stairs  PARTICIPATION LIMITATIONS: meal prep, cleaning, shopping, community activity, and occupation  REHAB POTENTIAL: Good  CLINICAL DECISION MAKING: Evolving/moderate complexity  EVALUATION COMPLEXITY: Moderate   GOALS: Goals reviewed with patient? Yes  SHORT TERM GOALS: Target date: 08/27/2024 patient will be independent with initial HEP and compliant with HEP 3-4 times a week    Baseline: Goal status: INITIAL  2.  Patient will report 30% improvement overall  Baseline:  Goal status: INITIAL   LONG TERM GOALS: Target date: 09/13/2024  Patient will be independent in self management strategies to improve quality of life and functional outcomes.  Baseline:  Goal status: INITIAL  2.  Patient will report 50% improvement overall  Baseline:  Goal status: INITIAL  3.  Patient will increase right toe extension by 10 degrees and right ankle inversion by 10 degrees a to improve ability to actively clear her foot with swing phase of gait and navigate uneven surfaces without AFO Baseline: 18 degrees Goal status: INITIAL  4.  Patient will increase right toe extension and right ankle eversion to 3+/5 to improve ability to navigate uneven surfaces safely without AFO Baseline: see above Goal status: INITIAL  5.  Patient will decrease TUG score to 7 sec or less to demonstrate decreased fall risk Baseline: 8.9 sec Goal status: INITIAL   PLAN:  PT FREQUENCY: 2x/week  PT DURATION: 4 weeks  PLANNED INTERVENTIONS: 97164- PT Re-evaluation, 97110-Therapeutic exercises, 97530- Therapeutic activity, 97112- Neuromuscular re-education, 97535- Self Care, [id number]- Manual therapy, [id number]- Gait training, [phone]- Orthotic Fit/training, [phone]- Canalith repositioning, [id number]- Aquatic Therapy, 97760- Splinting, [phone]- Wound care (first 20 sq cm), 97598- Wound care (each additional 20 sq cm)Patient/Family education, Balance training, Stair training, Taping, Dry Needling, Joint mobilization, Joint manipulation, Spinal manipulation, Spinal mobilization, Scar mobilization, and DME instructions.   PLAN FOR NEXT SESSION: continue e-stim; NMES for right extensor hallucis longus; ankle dorsiflexors and invertors; right ankle mobility and strengthening  Lang Ada, PT, DPT [facility] Office: [phone] 2:16 PM, 08/15/24   "

## 2024-08-21 ENCOUNTER — Other Ambulatory Visit: Payer: Self-pay | Admitting: Family Medicine

## 2024-08-27 ENCOUNTER — Ambulatory Visit (HOSPITAL_COMMUNITY)

## 2024-08-27 ENCOUNTER — Encounter (HOSPITAL_COMMUNITY): Payer: Self-pay

## 2024-08-27 DIAGNOSIS — M21371 Foot drop, right foot: Secondary | ICD-10-CM

## 2024-08-27 DIAGNOSIS — R262 Difficulty in walking, not elsewhere classified: Secondary | ICD-10-CM

## 2024-08-27 DIAGNOSIS — R29898 Other symptoms and signs involving the musculoskeletal system: Secondary | ICD-10-CM

## 2024-08-29 ENCOUNTER — Encounter (HOSPITAL_COMMUNITY): Payer: Self-pay

## 2024-08-29 ENCOUNTER — Ambulatory Visit (HOSPITAL_COMMUNITY)

## 2024-08-29 DIAGNOSIS — M21371 Foot drop, right foot: Secondary | ICD-10-CM

## 2024-08-29 DIAGNOSIS — R29898 Other symptoms and signs involving the musculoskeletal system: Secondary | ICD-10-CM

## 2024-08-29 DIAGNOSIS — R262 Difficulty in walking, not elsewhere classified: Secondary | ICD-10-CM

## 2024-08-29 NOTE — Therapy (Signed)
 " OUTPATIENT PHYSICAL THERAPY LOWER EXTREMITY TREATMENT   Patient Name: Brianna [name] MRN: [id number] DOB:1991/01/16, 34 y.o., female Today's Date: 08/29/2024  END OF SESSION:  PT End of Session - 08/29/24 1335     Visit Number 4    Number of Visits 8    Date for Recertification  09/13/24    Authorization Type BCBS PPO    Progress Note Due on Visit 8    PT Start Time 1335    PT Stop Time 1422    PT Time Calculation (min) 47 min    Activity Tolerance Patient tolerated treatment well    Behavior During Therapy WFL for tasks assessed/performed             Past Medical History:  Diagnosis Date   Anemia    Iron Deficiency Anemia   Back pain    Diabetes mellitus without complication (HCC)    Hypertension    Hypothyroidism    Obesity    Pain    back pain, dx. herniated nucleus polpusos   Sleep apnea    Unable to tolerate CPAP   Thyroid  disease    Past Surgical History:  Procedure Laterality Date   CESAREAN SECTION  2023   LUMBAR LAMINECTOMY/DECOMPRESSION MICRODISCECTOMY N/A 09/03/2014   Procedure: MICROLUMBAR DECOMPRESSION LUMBAR FOUR TO FIVE, LUMBAR FIVE TO SACRAL ONE;  Surgeon: Reyes JAYSON Billing, MD;  Location: [facility];  Service: Orthopedics;  Laterality: N/A;   TONSILLECTOMY     with Adenoids   TRANSFORAMINAL LUMBAR INTERBODY FUSION (TLIF) WITH PEDICLE SCREW FIXATION 1 LEVEL N/A 10/09/2023   Procedure: TRANSFORAMINAL LUMBAR INTERBODY FUSION (TLIF) WITH PEDICLE SCREW FIXATION 1 LEVEL L4-5;  Surgeon: Burnetta Aures, MD;  Location: [facility];  Service: Orthopedics;  Laterality: N/A;  6 hrs 3 C-Bed   WISDOM TOOTH EXTRACTION     Patient Active Problem List   Diagnosis Date Noted   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 07/09/2024   Drug-induced constipation 05/10/2024   Iron deficiency anemia 04/04/2024   Abdominal wall bulge 01/03/2024   Lumbago with sciatica 11/17/2023   Postoperative pain 10/31/2023   S/P lumbar fusion 10/09/2023   Pain of lumbar spine 08/30/2023   Low  back pain 05/08/2023   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/08/2023   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   Postpartum care following cesarean delivery 03/01/2022   Chronic hypertension with superimposed pre-eclampsia 02/19/2022   S/P C-section 02/19/2022   Obstructive sleep apnea 04/02/2021   Hypothyroidism 12/09/2019   Hyperlipidemia associated with type 2 diabetes mellitus (HCC) 04/19/2017   HNP (herniated nucleus pulposus), lumbar 09/03/2014   Obesity, unspecified 09/03/2014   Type 2 diabetes mellitus (HCC) 09/03/2014   HTN (hypertension) 09/03/2014    PCP: Bluford Jacqulyn MATSU, DO  REFERRING PROVIDER: Mauro Elveria JAYSON, NP  REFERRING DIAG:  Diagnosis  M21.371 (ICD-10-CM) - Foot drop, right foot    THERAPY DIAG:  Foot drop, right  Difficulty in walking, not elsewhere classified  Ankle weakness  Rationale for Evaluation and Treatment: Rehabilitation  ONSET DATE: 10/19/2023 s/p fusion lumbar  SUBJEDCTIVE:   SUBJECTIVE STATEMENT: Pt states she was pretty sore in the thighs after last time, still feeling it a little today. Pt states she just got on the step stool at work today, was not too bad. Pt states the big toe is still only moving a little bit.  Eval: S/p lumbar fusion per Dr. Burnetta; had foot drop prior to the surgery but it is still lingering.  Wearing right AFO; states she only  wears outside of the home.  Wants to get her active motion back  PERTINENT HISTORY: DM, HTN PAIN:  Are you having pain? Yes: NPRS scale: 0-7/10 Pain location: low back Pain description: sore and tight Aggravating factors: standing on cement floors at work Relieving factors: take a pain pill; lay down  PRECAUTIONS: Back   WEIGHT BEARING RESTRICTIONS: No  FALLS:  Has patient fallen in last 6 months? No   OCCUPATION: international aid/development worker at [employer]  PLOF: Independent  PATIENT GOALS: not have to wear the AFO  NEXT MD VISIT: 09/06/24  OBJECTIVE:  Note: Objective measures  were completed at Evaluation unless otherwise noted.  DIAGNOSTIC FINDINGS:   PATIENT SURVEYS:  LEFS  Extreme difficulty/unable (0), Quite a bit of difficulty (1), Moderate difficulty (2), Little difficulty (3), No difficulty (4) Survey date:    Any of your usual work, housework or school activities   2. Usual hobbies, recreational or sporting activities   3. Getting into/out of the bath   4. Walking between rooms   5. Putting on socks/shoes   6. Squatting    7. Lifting an object, like a bag of groceries from the floor   8. Performing light activities around your home   9. Performing heavy activities around your home   10. Getting into/out of a car   11. Walking 2 blocks   12. Walking 1 mile   13. Going up/down 10 stairs (1 flight)   14. Standing for 1 hour   15.  sitting for 1 hour   16. Running on even ground   17. Running on uneven ground   18. Making sharp turns while running fast   19. Hopping    20. Rolling over in bed   Score total:  58/80; 72.5%     COGNITION: Overall cognitive status: Within functional limits for tasks assessed     SENSATION: WFL  EDEMA:  None noted  MUSCLE LENGTH: POSTURE: No Significant postural limitations  PALPATION: No significant soreness reported  LOWER EXTREMITY ROM:  Active ROM Right eval Left eval  Hip flexion    Hip extension    Hip abduction    Hip adduction    Hip internal rotation    Hip external rotation    Knee flexion    Knee extension    Ankle dorsiflexion    Ankle plantarflexion    Ankle inversion 18 40  Ankle eversion    Big toe extension 45 68   (Blank rows = not tested)  LOWER EXTREMITY MMT:  MMT Right eval Left eval  Hip flexion 4+ 5  Hip extension    Hip abduction    Hip adduction    Hip internal rotation    Hip external rotation    Knee flexion    Knee extension 5 5  Ankle dorsiflexion 3- 5  Ankle plantarflexion    Ankle inversion 3+ 5  Ankle eversion    Big toe extension     (Blank  rows = not tested)    FUNCTIONAL TESTS:  5 times sit to stand: 16.00 sec no UE assist Timed up and go (TUG): 8.90 sec no AD SLS   SLS 08/15/2024: R: 4.35 seconds L: 30 seconds  GAIT: Distance walked: 50 ft Assistive device utilized: None Level of assistance: Complete Independence Comments: no significant deviation noted; right AFO  TREATMENT DATE:  08/29/2024  Therapeutic Exercise: -Nustep, 5 minutes, seat 9, pt cued for 60-80 SPM -Rocker board, DF/PF, 3 sets of 20 reps, (SLS for last two sets) pt cued for max ROM and decreased UE support, no AFO used Neuromuscular Reeducation:  -Paloff press, 1 set of 15 reps on blue foam, pt cued for sequencing, blue theraband at chest level -Shoulder extension/rows, 1 set of 10 reps bilaterally, blue theraband at chest level, pt cued for TTWB on LLE for increased RLE activation -Standing BAPS board, circles both directions, 2 sets of 10 reps bilaterally, BUE support in parallel bars -Supine/seated/standing ankle pumps and dorsiflexion marches with Russian Estim, 12 minutes, pt cued for increased ROM. Set at 50mA of intensity, 5 seconds on, 5 seconds off, 12 minutes, pt cued for max ankle and big toe ROM -Dead mills, 30 second bouts, 2 bouts fwd and bwd, pt cued for controlled speed and ankle ROM    08/15/2024  Therapeutic Exercise: -Stationary bike full, 5 minutes, seat 12, pt cued for at least 50 SPM -Heel raises, 2 sets of 10 reps, pt cued for max ROM - SLS 1 bout of 30 seconds bilaterally -Standing calf stretch on incline, 1 set of 2 reps, 30 second holds, UE support on parallel bars, pt cued for alignment of shoulders, hips, knees, and ankles Neuromuscular Reeducation:  -Seated ankle pumps with Russian Estim, 12 minutes, pt cued for increased ROM. Set at 80mA of intensity, 4 seconds on, 12 seconds off, 12  minutes    08/13/2024 physical therapy evaluation and HEP instruction    PATIENT EDUCATION:  Education details: Patient educated on exam findings, POC, scope of PT, HEP, and what to expect next visit. Person educated: Patient Education method: Explanation, Demonstration, and Handouts Education comprehension: verbalized understanding, returned demonstration, verbal cues required, and tactile cues required HOME EXERCISE PROGRAM: Access Code: [email or web address] URL: [email or web address] Date: 08/13/2024 Prepared by: [facility] - Rehab  Exercises - Seated Self Great Toe Stretch  - 2 x daily - 7 x weekly - 3 sets - 10 reps - Seated Ankle Dorsiflexion Stretch  - 2 x daily - 7 x weekly - 1 sets - 10 reps - Long Sitting Ankle Inversion with Resistance  - 1 x daily - 7 x weekly - 2 sets - 10 reps - Seated Ankle Inversion Eversion PROM  - 1 x daily - 7 x weekly - 1 sets - 10 reps - Long Sitting Calf Stretch with Strap  - 1 x daily - 7 x weekly - 1 sets - 10 reps  ASSESSMENT:  CLINICAL IMPRESSION: Patient continues to demonstrate decreased RLE dorsiflexion and great toe extension strength (minimal big toe motion with NMES this date), decreased gait quality and balance. Patient also demonstrates fair endurance with progressed aerobic based exercise during today's session with stationary bike. Patient able to progress dynamic balance and R dorsiflexion activation exercises today with banded pulls with unstable surface and BAPs board activities, fair performance with verbal cueing. Patient would continue to benefit from skilled physical therapy for decreased RLE foot drop, increased endurance with ambulation, increased RLE strength/ROM, and improved balance for improved quality of life, improved independence with community ambulation and continued progress towards therapy goals.  Eval: Patient is a 34 y.o. female who was seen today for physical therapy evaluation and treatment for Foot drop, right foot.    Patient demonstrates muscle weakness, reduced ROM, and fascial restrictions which are likely contributing to symptoms of pain and are negatively impacting patient ability  to perform ADLs and functional mobility tasks. Patient will benefit from skilled physical therapy services to address these deficits to reduce pain and improve level of function with ADLs and functional mobility tasks.    OBJECTIVE IMPAIRMENTS: Abnormal gait, decreased activity tolerance, and impaired perceived functional ability.   ACTIVITY LIMITATIONS: standing, squatting, and stairs  PARTICIPATION LIMITATIONS: meal prep, cleaning, shopping, community activity, and occupation  REHAB POTENTIAL: Good  CLINICAL DECISION MAKING: Evolving/moderate complexity  EVALUATION COMPLEXITY: Moderate   GOALS: Goals reviewed with patient? Yes  SHORT TERM GOALS: Target date: 08/27/2024 patient will be independent with initial HEP and compliant with HEP 3-4 times a week   Baseline: Goal status: INITIAL  2.  Patient will report 30% improvement overall  Baseline:  Goal status: INITIAL   LONG TERM GOALS: Target date: 09/13/2024  Patient will be independent in self management strategies to improve quality of life and functional outcomes.  Baseline:  Goal status: INITIAL  2.  Patient will report 50% improvement overall  Baseline:  Goal status: INITIAL  3.  Patient will increase right toe extension by 10 degrees and right ankle inversion by 10 degrees a to improve ability to actively clear her foot with swing phase of gait and navigate uneven surfaces without AFO Baseline: 18 degrees Goal status: INITIAL  4.  Patient will increase right toe extension and right ankle eversion to 3+/5 to improve ability to navigate uneven surfaces safely without AFO Baseline: see above Goal status: INITIAL  5.  Patient will decrease TUG score to 7 sec or less to demonstrate decreased fall risk Baseline: 8.9 sec Goal status:  INITIAL   PLAN:  PT FREQUENCY: 2x/week  PT DURATION: 4 weeks  PLANNED INTERVENTIONS: 97164- PT Re-evaluation, 97110-Therapeutic exercises, 97530- Therapeutic activity, 97112- Neuromuscular re-education, 97535- Self Care, 02859- Manual therapy, [id number]- Gait training, [phone]- Orthotic Fit/training, [phone]- Canalith repositioning, [id number]- Aquatic Therapy, 97760- Splinting, [phone]- Wound care (first 20 sq cm), 97598- Wound care (each additional 20 sq cm)Patient/Family education, Balance training, Stair training, Taping, Dry Needling, Joint mobilization, Joint manipulation, Spinal manipulation, Spinal mobilization, Scar mobilization, and DME instructions.   PLAN FOR NEXT SESSION: continue e-stim; NMES for right extensor hallucis longus; ankle dorsiflexors and invertors; right ankle mobility and strengthening  Lang Ada, PT, DPT [facility] Office: [phone] 2:31 PM, 08/29/24   "

## 2024-09-03 ENCOUNTER — Ambulatory Visit (HOSPITAL_COMMUNITY)

## 2024-09-03 ENCOUNTER — Ambulatory Visit (INDEPENDENT_AMBULATORY_CARE_PROVIDER_SITE_OTHER): Admitting: Internal Medicine

## 2024-09-04 ENCOUNTER — Ambulatory Visit (HOSPITAL_COMMUNITY)

## 2024-09-06 ENCOUNTER — Ambulatory Visit: Admitting: Nurse Practitioner

## 2024-09-06 ENCOUNTER — Ambulatory Visit (HOSPITAL_COMMUNITY)

## 2024-09-10 ENCOUNTER — Ambulatory Visit (HOSPITAL_COMMUNITY)

## 2024-09-12 ENCOUNTER — Ambulatory Visit (HOSPITAL_COMMUNITY)

## 2024-09-17 ENCOUNTER — Ambulatory Visit (HOSPITAL_COMMUNITY)

## 2024-09-20 ENCOUNTER — Ambulatory Visit (HOSPITAL_COMMUNITY)
